# Patient Record
Sex: Male | Born: 1945
Health system: Southern US, Community
[De-identification: ages and names within clinical notes are randomized; demographics above are authoritative.]

## PROBLEM LIST (undated history)

## (undated) DIAGNOSIS — E119 Type 2 diabetes mellitus without complications: Secondary | ICD-10-CM

## (undated) DIAGNOSIS — I5022 Chronic systolic (congestive) heart failure: Secondary | ICD-10-CM

## (undated) DIAGNOSIS — I214 Non-ST elevation (NSTEMI) myocardial infarction: Secondary | ICD-10-CM

## (undated) DIAGNOSIS — I255 Ischemic cardiomyopathy: Secondary | ICD-10-CM

## (undated) DIAGNOSIS — E785 Hyperlipidemia, unspecified: Secondary | ICD-10-CM

## (undated) DIAGNOSIS — N1832 Chronic kidney disease, stage 3b: Secondary | ICD-10-CM

## (undated) DIAGNOSIS — I10 Essential (primary) hypertension: Secondary | ICD-10-CM

## (undated) HISTORY — PX: PROSTATE SURGERY: SHX751

## (undated) HISTORY — DX: Non-ST elevation (NSTEMI) myocardial infarction: I21.4

## (undated) HISTORY — PX: HERNIA REPAIR: SHX51

---

## 2015-08-28 DIAGNOSIS — G4733 Obstructive sleep apnea (adult) (pediatric): Secondary | ICD-10-CM | POA: Diagnosis not present

## 2015-09-25 DIAGNOSIS — R809 Proteinuria, unspecified: Secondary | ICD-10-CM | POA: Diagnosis not present

## 2015-09-25 DIAGNOSIS — E1129 Type 2 diabetes mellitus with other diabetic kidney complication: Secondary | ICD-10-CM | POA: Diagnosis not present

## 2015-09-25 DIAGNOSIS — I10 Essential (primary) hypertension: Secondary | ICD-10-CM | POA: Diagnosis not present

## 2015-09-25 DIAGNOSIS — N401 Enlarged prostate with lower urinary tract symptoms: Secondary | ICD-10-CM | POA: Diagnosis not present

## 2015-09-28 DIAGNOSIS — G4733 Obstructive sleep apnea (adult) (pediatric): Secondary | ICD-10-CM | POA: Diagnosis not present

## 2015-10-12 DIAGNOSIS — G4733 Obstructive sleep apnea (adult) (pediatric): Secondary | ICD-10-CM | POA: Diagnosis not present

## 2015-10-12 DIAGNOSIS — J301 Allergic rhinitis due to pollen: Secondary | ICD-10-CM | POA: Diagnosis not present

## 2015-10-26 DIAGNOSIS — G4733 Obstructive sleep apnea (adult) (pediatric): Secondary | ICD-10-CM | POA: Diagnosis not present

## 2015-11-04 DIAGNOSIS — G4733 Obstructive sleep apnea (adult) (pediatric): Secondary | ICD-10-CM | POA: Diagnosis not present

## 2015-11-06 DIAGNOSIS — E1129 Type 2 diabetes mellitus with other diabetic kidney complication: Secondary | ICD-10-CM | POA: Diagnosis not present

## 2015-11-06 DIAGNOSIS — Z1389 Encounter for screening for other disorder: Secondary | ICD-10-CM | POA: Diagnosis not present

## 2015-11-06 DIAGNOSIS — H6122 Impacted cerumen, left ear: Secondary | ICD-10-CM | POA: Diagnosis not present

## 2015-11-06 DIAGNOSIS — Z1211 Encounter for screening for malignant neoplasm of colon: Secondary | ICD-10-CM | POA: Diagnosis not present

## 2015-11-06 DIAGNOSIS — R809 Proteinuria, unspecified: Secondary | ICD-10-CM | POA: Diagnosis not present

## 2015-11-06 DIAGNOSIS — Z9181 History of falling: Secondary | ICD-10-CM | POA: Diagnosis not present

## 2015-11-06 DIAGNOSIS — I10 Essential (primary) hypertension: Secondary | ICD-10-CM | POA: Diagnosis not present

## 2015-11-06 DIAGNOSIS — N401 Enlarged prostate with lower urinary tract symptoms: Secondary | ICD-10-CM | POA: Diagnosis not present

## 2015-11-06 DIAGNOSIS — Z0000 Encounter for general adult medical examination without abnormal findings: Secondary | ICD-10-CM | POA: Diagnosis not present

## 2015-11-14 DIAGNOSIS — E119 Type 2 diabetes mellitus without complications: Secondary | ICD-10-CM | POA: Diagnosis not present

## 2015-11-14 DIAGNOSIS — H2513 Age-related nuclear cataract, bilateral: Secondary | ICD-10-CM | POA: Diagnosis not present

## 2015-11-26 DIAGNOSIS — G4733 Obstructive sleep apnea (adult) (pediatric): Secondary | ICD-10-CM | POA: Diagnosis not present

## 2015-11-27 DIAGNOSIS — R319 Hematuria, unspecified: Secondary | ICD-10-CM | POA: Diagnosis not present

## 2015-11-27 DIAGNOSIS — Z125 Encounter for screening for malignant neoplasm of prostate: Secondary | ICD-10-CM | POA: Diagnosis not present

## 2015-11-27 DIAGNOSIS — N200 Calculus of kidney: Secondary | ICD-10-CM | POA: Diagnosis not present

## 2015-11-27 DIAGNOSIS — R828 Abnormal findings on cytological and histological examination of urine: Secondary | ICD-10-CM | POA: Diagnosis not present

## 2015-11-29 DIAGNOSIS — Z1211 Encounter for screening for malignant neoplasm of colon: Secondary | ICD-10-CM | POA: Diagnosis not present

## 2015-12-22 DIAGNOSIS — R319 Hematuria, unspecified: Secondary | ICD-10-CM | POA: Diagnosis not present

## 2015-12-22 DIAGNOSIS — R896 Abnormal cytological findings in specimens from other organs, systems and tissues: Secondary | ICD-10-CM | POA: Diagnosis not present

## 2015-12-26 DIAGNOSIS — G4733 Obstructive sleep apnea (adult) (pediatric): Secondary | ICD-10-CM | POA: Diagnosis not present

## 2016-01-01 DIAGNOSIS — R896 Abnormal cytological findings in specimens from other organs, systems and tissues: Secondary | ICD-10-CM | POA: Diagnosis not present

## 2016-01-01 DIAGNOSIS — K4020 Bilateral inguinal hernia, without obstruction or gangrene, not specified as recurrent: Secondary | ICD-10-CM | POA: Diagnosis not present

## 2016-01-01 DIAGNOSIS — R319 Hematuria, unspecified: Secondary | ICD-10-CM | POA: Diagnosis not present

## 2016-01-01 DIAGNOSIS — K8020 Calculus of gallbladder without cholecystitis without obstruction: Secondary | ICD-10-CM | POA: Diagnosis not present

## 2016-01-01 DIAGNOSIS — N200 Calculus of kidney: Secondary | ICD-10-CM | POA: Diagnosis not present

## 2016-01-03 DIAGNOSIS — N210 Calculus in bladder: Secondary | ICD-10-CM | POA: Diagnosis not present

## 2016-01-15 DIAGNOSIS — Z125 Encounter for screening for malignant neoplasm of prostate: Secondary | ICD-10-CM | POA: Diagnosis not present

## 2016-01-15 DIAGNOSIS — R896 Abnormal cytological findings in specimens from other organs, systems and tissues: Secondary | ICD-10-CM | POA: Diagnosis not present

## 2016-01-15 DIAGNOSIS — N200 Calculus of kidney: Secondary | ICD-10-CM | POA: Diagnosis not present

## 2016-01-15 DIAGNOSIS — R319 Hematuria, unspecified: Secondary | ICD-10-CM | POA: Diagnosis not present

## 2016-01-23 DIAGNOSIS — M79671 Pain in right foot: Secondary | ICD-10-CM | POA: Diagnosis not present

## 2016-01-23 DIAGNOSIS — L03031 Cellulitis of right toe: Secondary | ICD-10-CM | POA: Diagnosis not present

## 2016-01-23 DIAGNOSIS — L989 Disorder of the skin and subcutaneous tissue, unspecified: Secondary | ICD-10-CM | POA: Diagnosis not present

## 2016-01-26 DIAGNOSIS — G4733 Obstructive sleep apnea (adult) (pediatric): Secondary | ICD-10-CM | POA: Diagnosis not present

## 2016-02-06 DIAGNOSIS — G4733 Obstructive sleep apnea (adult) (pediatric): Secondary | ICD-10-CM | POA: Diagnosis not present

## 2016-02-14 DIAGNOSIS — G4733 Obstructive sleep apnea (adult) (pediatric): Secondary | ICD-10-CM | POA: Diagnosis not present

## 2016-02-14 DIAGNOSIS — J301 Allergic rhinitis due to pollen: Secondary | ICD-10-CM | POA: Diagnosis not present

## 2016-02-15 DIAGNOSIS — G4733 Obstructive sleep apnea (adult) (pediatric): Secondary | ICD-10-CM | POA: Diagnosis not present

## 2016-02-19 DIAGNOSIS — M9902 Segmental and somatic dysfunction of thoracic region: Secondary | ICD-10-CM | POA: Diagnosis not present

## 2016-02-19 DIAGNOSIS — M9901 Segmental and somatic dysfunction of cervical region: Secondary | ICD-10-CM | POA: Diagnosis not present

## 2016-02-19 DIAGNOSIS — M791 Myalgia: Secondary | ICD-10-CM | POA: Diagnosis not present

## 2016-02-25 DIAGNOSIS — G4733 Obstructive sleep apnea (adult) (pediatric): Secondary | ICD-10-CM | POA: Diagnosis not present

## 2016-04-26 DIAGNOSIS — Z125 Encounter for screening for malignant neoplasm of prostate: Secondary | ICD-10-CM | POA: Diagnosis not present

## 2016-04-30 DIAGNOSIS — N400 Enlarged prostate without lower urinary tract symptoms: Secondary | ICD-10-CM | POA: Diagnosis not present

## 2016-04-30 DIAGNOSIS — N200 Calculus of kidney: Secondary | ICD-10-CM | POA: Diagnosis not present

## 2016-05-02 DIAGNOSIS — Z23 Encounter for immunization: Secondary | ICD-10-CM | POA: Diagnosis not present

## 2016-05-02 DIAGNOSIS — Z0000 Encounter for general adult medical examination without abnormal findings: Secondary | ICD-10-CM | POA: Diagnosis not present

## 2016-05-02 DIAGNOSIS — E1129 Type 2 diabetes mellitus with other diabetic kidney complication: Secondary | ICD-10-CM | POA: Diagnosis not present

## 2016-05-10 DIAGNOSIS — G4733 Obstructive sleep apnea (adult) (pediatric): Secondary | ICD-10-CM | POA: Diagnosis not present

## 2016-07-08 DIAGNOSIS — G4733 Obstructive sleep apnea (adult) (pediatric): Secondary | ICD-10-CM | POA: Diagnosis not present

## 2016-07-09 DIAGNOSIS — G4733 Obstructive sleep apnea (adult) (pediatric): Secondary | ICD-10-CM | POA: Diagnosis not present

## 2016-07-09 DIAGNOSIS — J301 Allergic rhinitis due to pollen: Secondary | ICD-10-CM | POA: Diagnosis not present

## 2016-08-01 DIAGNOSIS — N200 Calculus of kidney: Secondary | ICD-10-CM | POA: Diagnosis not present

## 2016-08-08 DIAGNOSIS — G4733 Obstructive sleep apnea (adult) (pediatric): Secondary | ICD-10-CM | POA: Diagnosis not present

## 2016-10-18 DIAGNOSIS — J301 Allergic rhinitis due to pollen: Secondary | ICD-10-CM | POA: Diagnosis not present

## 2016-10-18 DIAGNOSIS — G4733 Obstructive sleep apnea (adult) (pediatric): Secondary | ICD-10-CM | POA: Diagnosis not present

## 2016-11-06 DIAGNOSIS — K649 Unspecified hemorrhoids: Secondary | ICD-10-CM | POA: Diagnosis not present

## 2016-11-06 DIAGNOSIS — Z6839 Body mass index (BMI) 39.0-39.9, adult: Secondary | ICD-10-CM | POA: Diagnosis not present

## 2016-11-06 DIAGNOSIS — Z0000 Encounter for general adult medical examination without abnormal findings: Secondary | ICD-10-CM | POA: Diagnosis not present

## 2016-11-06 DIAGNOSIS — I10 Essential (primary) hypertension: Secondary | ICD-10-CM | POA: Diagnosis not present

## 2016-11-06 DIAGNOSIS — Z7984 Long term (current) use of oral hypoglycemic drugs: Secondary | ICD-10-CM | POA: Diagnosis not present

## 2016-11-06 DIAGNOSIS — Z86018 Personal history of other benign neoplasm: Secondary | ICD-10-CM | POA: Diagnosis not present

## 2016-11-06 DIAGNOSIS — E119 Type 2 diabetes mellitus without complications: Secondary | ICD-10-CM | POA: Diagnosis not present

## 2016-11-06 DIAGNOSIS — K089 Disorder of teeth and supporting structures, unspecified: Secondary | ICD-10-CM | POA: Diagnosis not present

## 2016-11-06 DIAGNOSIS — E669 Obesity, unspecified: Secondary | ICD-10-CM | POA: Diagnosis not present

## 2016-11-06 DIAGNOSIS — N403 Nodular prostate with lower urinary tract symptoms: Secondary | ICD-10-CM | POA: Diagnosis not present

## 2016-11-18 DIAGNOSIS — G4733 Obstructive sleep apnea (adult) (pediatric): Secondary | ICD-10-CM | POA: Diagnosis not present

## 2016-11-19 DIAGNOSIS — N400 Enlarged prostate without lower urinary tract symptoms: Secondary | ICD-10-CM | POA: Diagnosis not present

## 2016-11-26 DIAGNOSIS — N2889 Other specified disorders of kidney and ureter: Secondary | ICD-10-CM | POA: Diagnosis not present

## 2016-11-26 DIAGNOSIS — N200 Calculus of kidney: Secondary | ICD-10-CM | POA: Diagnosis not present

## 2016-11-26 DIAGNOSIS — N400 Enlarged prostate without lower urinary tract symptoms: Secondary | ICD-10-CM | POA: Diagnosis not present

## 2016-12-09 DIAGNOSIS — E119 Type 2 diabetes mellitus without complications: Secondary | ICD-10-CM | POA: Diagnosis not present

## 2016-12-09 DIAGNOSIS — H2513 Age-related nuclear cataract, bilateral: Secondary | ICD-10-CM | POA: Diagnosis not present

## 2017-01-07 DIAGNOSIS — Z6837 Body mass index (BMI) 37.0-37.9, adult: Secondary | ICD-10-CM | POA: Diagnosis not present

## 2017-01-07 DIAGNOSIS — R809 Proteinuria, unspecified: Secondary | ICD-10-CM | POA: Diagnosis not present

## 2017-01-07 DIAGNOSIS — S40811A Abrasion of right upper arm, initial encounter: Secondary | ICD-10-CM | POA: Diagnosis not present

## 2017-01-07 DIAGNOSIS — I10 Essential (primary) hypertension: Secondary | ICD-10-CM | POA: Diagnosis not present

## 2017-01-07 DIAGNOSIS — N401 Enlarged prostate with lower urinary tract symptoms: Secondary | ICD-10-CM | POA: Diagnosis not present

## 2017-01-07 DIAGNOSIS — E1129 Type 2 diabetes mellitus with other diabetic kidney complication: Secondary | ICD-10-CM | POA: Diagnosis not present

## 2017-01-07 DIAGNOSIS — Z9181 History of falling: Secondary | ICD-10-CM | POA: Diagnosis not present

## 2017-01-07 DIAGNOSIS — Z1389 Encounter for screening for other disorder: Secondary | ICD-10-CM | POA: Diagnosis not present

## 2017-01-07 DIAGNOSIS — Z0000 Encounter for general adult medical examination without abnormal findings: Secondary | ICD-10-CM | POA: Diagnosis not present

## 2017-01-07 DIAGNOSIS — Z1159 Encounter for screening for other viral diseases: Secondary | ICD-10-CM | POA: Diagnosis not present

## 2017-01-16 DIAGNOSIS — M79672 Pain in left foot: Secondary | ICD-10-CM | POA: Diagnosis not present

## 2017-01-16 DIAGNOSIS — L989 Disorder of the skin and subcutaneous tissue, unspecified: Secondary | ICD-10-CM | POA: Diagnosis not present

## 2017-01-16 DIAGNOSIS — L600 Ingrowing nail: Secondary | ICD-10-CM | POA: Diagnosis not present

## 2017-01-30 DIAGNOSIS — L603 Nail dystrophy: Secondary | ICD-10-CM | POA: Diagnosis not present

## 2017-01-30 DIAGNOSIS — Z6838 Body mass index (BMI) 38.0-38.9, adult: Secondary | ICD-10-CM | POA: Diagnosis not present

## 2017-01-30 DIAGNOSIS — R809 Proteinuria, unspecified: Secondary | ICD-10-CM | POA: Diagnosis not present

## 2017-01-30 DIAGNOSIS — N401 Enlarged prostate with lower urinary tract symptoms: Secondary | ICD-10-CM | POA: Diagnosis not present

## 2017-01-30 DIAGNOSIS — E1129 Type 2 diabetes mellitus with other diabetic kidney complication: Secondary | ICD-10-CM | POA: Diagnosis not present

## 2017-01-30 DIAGNOSIS — I10 Essential (primary) hypertension: Secondary | ICD-10-CM | POA: Diagnosis not present

## 2017-02-20 DIAGNOSIS — G4733 Obstructive sleep apnea (adult) (pediatric): Secondary | ICD-10-CM | POA: Diagnosis not present

## 2017-03-04 DIAGNOSIS — I10 Essential (primary) hypertension: Secondary | ICD-10-CM | POA: Diagnosis not present

## 2017-03-04 DIAGNOSIS — Z6837 Body mass index (BMI) 37.0-37.9, adult: Secondary | ICD-10-CM | POA: Diagnosis not present

## 2017-03-04 DIAGNOSIS — Z0000 Encounter for general adult medical examination without abnormal findings: Secondary | ICD-10-CM | POA: Diagnosis not present

## 2017-04-04 DIAGNOSIS — Z6837 Body mass index (BMI) 37.0-37.9, adult: Secondary | ICD-10-CM | POA: Diagnosis not present

## 2017-04-04 DIAGNOSIS — R809 Proteinuria, unspecified: Secondary | ICD-10-CM | POA: Diagnosis not present

## 2017-04-04 DIAGNOSIS — E1129 Type 2 diabetes mellitus with other diabetic kidney complication: Secondary | ICD-10-CM | POA: Diagnosis not present

## 2017-04-04 DIAGNOSIS — I10 Essential (primary) hypertension: Secondary | ICD-10-CM | POA: Diagnosis not present

## 2017-04-04 DIAGNOSIS — N401 Enlarged prostate with lower urinary tract symptoms: Secondary | ICD-10-CM | POA: Diagnosis not present

## 2017-05-12 DIAGNOSIS — L03031 Cellulitis of right toe: Secondary | ICD-10-CM | POA: Diagnosis not present

## 2017-05-12 DIAGNOSIS — L989 Disorder of the skin and subcutaneous tissue, unspecified: Secondary | ICD-10-CM | POA: Diagnosis not present

## 2017-05-12 DIAGNOSIS — M79671 Pain in right foot: Secondary | ICD-10-CM | POA: Diagnosis not present

## 2017-05-27 DIAGNOSIS — G4733 Obstructive sleep apnea (adult) (pediatric): Secondary | ICD-10-CM | POA: Diagnosis not present

## 2017-07-10 DIAGNOSIS — E1129 Type 2 diabetes mellitus with other diabetic kidney complication: Secondary | ICD-10-CM | POA: Diagnosis not present

## 2017-07-10 DIAGNOSIS — Z79899 Other long term (current) drug therapy: Secondary | ICD-10-CM | POA: Diagnosis not present

## 2017-07-10 DIAGNOSIS — I10 Essential (primary) hypertension: Secondary | ICD-10-CM | POA: Diagnosis not present

## 2017-07-10 DIAGNOSIS — N401 Enlarged prostate with lower urinary tract symptoms: Secondary | ICD-10-CM | POA: Diagnosis not present

## 2017-07-10 DIAGNOSIS — R809 Proteinuria, unspecified: Secondary | ICD-10-CM | POA: Diagnosis not present

## 2017-07-10 DIAGNOSIS — L603 Nail dystrophy: Secondary | ICD-10-CM | POA: Diagnosis not present

## 2017-07-10 DIAGNOSIS — Z6836 Body mass index (BMI) 36.0-36.9, adult: Secondary | ICD-10-CM | POA: Diagnosis not present

## 2017-07-10 DIAGNOSIS — Z1339 Encounter for screening examination for other mental health and behavioral disorders: Secondary | ICD-10-CM | POA: Diagnosis not present

## 2017-07-10 DIAGNOSIS — Z125 Encounter for screening for malignant neoplasm of prostate: Secondary | ICD-10-CM | POA: Diagnosis not present

## 2017-07-11 DIAGNOSIS — E119 Type 2 diabetes mellitus without complications: Secondary | ICD-10-CM | POA: Diagnosis not present

## 2017-07-11 DIAGNOSIS — H2513 Age-related nuclear cataract, bilateral: Secondary | ICD-10-CM | POA: Diagnosis not present

## 2017-08-28 DIAGNOSIS — G4733 Obstructive sleep apnea (adult) (pediatric): Secondary | ICD-10-CM | POA: Diagnosis not present

## 2017-12-05 DIAGNOSIS — G4733 Obstructive sleep apnea (adult) (pediatric): Secondary | ICD-10-CM | POA: Diagnosis not present

## 2017-12-09 DIAGNOSIS — N200 Calculus of kidney: Secondary | ICD-10-CM | POA: Diagnosis not present

## 2017-12-09 DIAGNOSIS — N400 Enlarged prostate without lower urinary tract symptoms: Secondary | ICD-10-CM | POA: Diagnosis not present

## 2017-12-09 DIAGNOSIS — R310 Gross hematuria: Secondary | ICD-10-CM | POA: Diagnosis not present

## 2017-12-09 DIAGNOSIS — R82998 Other abnormal findings in urine: Secondary | ICD-10-CM | POA: Diagnosis not present

## 2017-12-16 DIAGNOSIS — R896 Abnormal cytological findings in specimens from other organs, systems and tissues: Secondary | ICD-10-CM | POA: Diagnosis not present

## 2018-01-07 DIAGNOSIS — E119 Type 2 diabetes mellitus without complications: Secondary | ICD-10-CM | POA: Diagnosis not present

## 2018-01-07 DIAGNOSIS — H2513 Age-related nuclear cataract, bilateral: Secondary | ICD-10-CM | POA: Diagnosis not present

## 2018-01-12 DIAGNOSIS — Z0000 Encounter for general adult medical examination without abnormal findings: Secondary | ICD-10-CM | POA: Diagnosis not present

## 2018-01-12 DIAGNOSIS — R809 Proteinuria, unspecified: Secondary | ICD-10-CM | POA: Diagnosis not present

## 2018-01-12 DIAGNOSIS — N401 Enlarged prostate with lower urinary tract symptoms: Secondary | ICD-10-CM | POA: Diagnosis not present

## 2018-01-12 DIAGNOSIS — E1129 Type 2 diabetes mellitus with other diabetic kidney complication: Secondary | ICD-10-CM | POA: Diagnosis not present

## 2018-01-12 DIAGNOSIS — I10 Essential (primary) hypertension: Secondary | ICD-10-CM | POA: Diagnosis not present

## 2018-01-12 DIAGNOSIS — Z9181 History of falling: Secondary | ICD-10-CM | POA: Diagnosis not present

## 2018-01-12 DIAGNOSIS — Z6835 Body mass index (BMI) 35.0-35.9, adult: Secondary | ICD-10-CM | POA: Diagnosis not present

## 2018-01-12 DIAGNOSIS — Z1331 Encounter for screening for depression: Secondary | ICD-10-CM | POA: Diagnosis not present

## 2018-01-17 DIAGNOSIS — B351 Tinea unguium: Secondary | ICD-10-CM | POA: Diagnosis not present

## 2018-01-17 DIAGNOSIS — M79674 Pain in right toe(s): Secondary | ICD-10-CM | POA: Diagnosis not present

## 2018-01-17 DIAGNOSIS — M79609 Pain in unspecified limb: Secondary | ICD-10-CM | POA: Diagnosis not present

## 2018-01-17 DIAGNOSIS — L602 Onychogryphosis: Secondary | ICD-10-CM | POA: Diagnosis not present

## 2018-01-17 DIAGNOSIS — M79675 Pain in left toe(s): Secondary | ICD-10-CM | POA: Diagnosis not present

## 2018-03-10 DIAGNOSIS — G4733 Obstructive sleep apnea (adult) (pediatric): Secondary | ICD-10-CM | POA: Diagnosis not present

## 2018-06-10 DIAGNOSIS — R310 Gross hematuria: Secondary | ICD-10-CM | POA: Diagnosis not present

## 2018-06-10 DIAGNOSIS — N200 Calculus of kidney: Secondary | ICD-10-CM | POA: Diagnosis not present

## 2018-06-10 DIAGNOSIS — R319 Hematuria, unspecified: Secondary | ICD-10-CM | POA: Diagnosis not present

## 2018-06-10 DIAGNOSIS — R82998 Other abnormal findings in urine: Secondary | ICD-10-CM | POA: Diagnosis not present

## 2018-06-11 DIAGNOSIS — G4733 Obstructive sleep apnea (adult) (pediatric): Secondary | ICD-10-CM | POA: Diagnosis not present

## 2018-07-02 DIAGNOSIS — M79609 Pain in unspecified limb: Secondary | ICD-10-CM | POA: Diagnosis not present

## 2018-07-02 DIAGNOSIS — M79674 Pain in right toe(s): Secondary | ICD-10-CM | POA: Diagnosis not present

## 2018-07-02 DIAGNOSIS — B351 Tinea unguium: Secondary | ICD-10-CM | POA: Diagnosis not present

## 2018-07-02 DIAGNOSIS — M79675 Pain in left toe(s): Secondary | ICD-10-CM | POA: Diagnosis not present

## 2018-07-02 DIAGNOSIS — L84 Corns and callosities: Secondary | ICD-10-CM | POA: Diagnosis not present

## 2018-07-02 DIAGNOSIS — E1151 Type 2 diabetes mellitus with diabetic peripheral angiopathy without gangrene: Secondary | ICD-10-CM | POA: Diagnosis not present

## 2018-07-02 DIAGNOSIS — L602 Onychogryphosis: Secondary | ICD-10-CM | POA: Diagnosis not present

## 2018-07-31 DIAGNOSIS — N401 Enlarged prostate with lower urinary tract symptoms: Secondary | ICD-10-CM | POA: Diagnosis not present

## 2018-07-31 DIAGNOSIS — Z2821 Immunization not carried out because of patient refusal: Secondary | ICD-10-CM | POA: Diagnosis not present

## 2018-07-31 DIAGNOSIS — E1129 Type 2 diabetes mellitus with other diabetic kidney complication: Secondary | ICD-10-CM | POA: Diagnosis not present

## 2018-07-31 DIAGNOSIS — Z6836 Body mass index (BMI) 36.0-36.9, adult: Secondary | ICD-10-CM | POA: Diagnosis not present

## 2018-07-31 DIAGNOSIS — R809 Proteinuria, unspecified: Secondary | ICD-10-CM | POA: Diagnosis not present

## 2018-07-31 DIAGNOSIS — Z23 Encounter for immunization: Secondary | ICD-10-CM | POA: Diagnosis not present

## 2018-07-31 DIAGNOSIS — I10 Essential (primary) hypertension: Secondary | ICD-10-CM | POA: Diagnosis not present

## 2018-09-17 DIAGNOSIS — G4733 Obstructive sleep apnea (adult) (pediatric): Secondary | ICD-10-CM | POA: Diagnosis not present

## 2018-12-21 DIAGNOSIS — G4733 Obstructive sleep apnea (adult) (pediatric): Secondary | ICD-10-CM | POA: Diagnosis not present

## 2019-01-13 DIAGNOSIS — N200 Calculus of kidney: Secondary | ICD-10-CM | POA: Diagnosis not present

## 2019-01-13 DIAGNOSIS — R319 Hematuria, unspecified: Secondary | ICD-10-CM | POA: Diagnosis not present

## 2019-01-27 DIAGNOSIS — K8020 Calculus of gallbladder without cholecystitis without obstruction: Secondary | ICD-10-CM | POA: Diagnosis not present

## 2019-01-27 DIAGNOSIS — R319 Hematuria, unspecified: Secondary | ICD-10-CM | POA: Diagnosis not present

## 2019-01-27 DIAGNOSIS — K4020 Bilateral inguinal hernia, without obstruction or gangrene, not specified as recurrent: Secondary | ICD-10-CM | POA: Diagnosis not present

## 2019-01-27 DIAGNOSIS — N210 Calculus in bladder: Secondary | ICD-10-CM | POA: Diagnosis not present

## 2019-01-27 DIAGNOSIS — N400 Enlarged prostate without lower urinary tract symptoms: Secondary | ICD-10-CM | POA: Diagnosis not present

## 2019-01-27 DIAGNOSIS — K429 Umbilical hernia without obstruction or gangrene: Secondary | ICD-10-CM | POA: Diagnosis not present

## 2019-01-27 DIAGNOSIS — N200 Calculus of kidney: Secondary | ICD-10-CM | POA: Diagnosis not present

## 2019-02-01 DIAGNOSIS — N400 Enlarged prostate without lower urinary tract symptoms: Secondary | ICD-10-CM | POA: Diagnosis not present

## 2019-02-01 DIAGNOSIS — N210 Calculus in bladder: Secondary | ICD-10-CM | POA: Diagnosis not present

## 2019-02-01 DIAGNOSIS — N200 Calculus of kidney: Secondary | ICD-10-CM | POA: Diagnosis not present

## 2019-02-08 DIAGNOSIS — E785 Hyperlipidemia, unspecified: Secondary | ICD-10-CM | POA: Diagnosis not present

## 2019-02-08 DIAGNOSIS — Z79899 Other long term (current) drug therapy: Secondary | ICD-10-CM | POA: Diagnosis not present

## 2019-02-08 DIAGNOSIS — Z0000 Encounter for general adult medical examination without abnormal findings: Secondary | ICD-10-CM | POA: Diagnosis not present

## 2019-02-08 DIAGNOSIS — Z9181 History of falling: Secondary | ICD-10-CM | POA: Diagnosis not present

## 2019-02-08 DIAGNOSIS — I10 Essential (primary) hypertension: Secondary | ICD-10-CM | POA: Diagnosis not present

## 2019-02-08 DIAGNOSIS — R809 Proteinuria, unspecified: Secondary | ICD-10-CM | POA: Diagnosis not present

## 2019-02-08 DIAGNOSIS — Z6837 Body mass index (BMI) 37.0-37.9, adult: Secondary | ICD-10-CM | POA: Diagnosis not present

## 2019-02-08 DIAGNOSIS — N401 Enlarged prostate with lower urinary tract symptoms: Secondary | ICD-10-CM | POA: Diagnosis not present

## 2019-02-08 DIAGNOSIS — E1129 Type 2 diabetes mellitus with other diabetic kidney complication: Secondary | ICD-10-CM | POA: Diagnosis not present

## 2019-02-10 DIAGNOSIS — H2513 Age-related nuclear cataract, bilateral: Secondary | ICD-10-CM | POA: Diagnosis not present

## 2019-02-10 DIAGNOSIS — E119 Type 2 diabetes mellitus without complications: Secondary | ICD-10-CM | POA: Diagnosis not present

## 2019-02-15 DIAGNOSIS — N400 Enlarged prostate without lower urinary tract symptoms: Secondary | ICD-10-CM | POA: Diagnosis not present

## 2019-02-15 DIAGNOSIS — N210 Calculus in bladder: Secondary | ICD-10-CM | POA: Diagnosis not present

## 2019-02-15 DIAGNOSIS — N200 Calculus of kidney: Secondary | ICD-10-CM | POA: Diagnosis not present

## 2019-02-17 DIAGNOSIS — K4020 Bilateral inguinal hernia, without obstruction or gangrene, not specified as recurrent: Secondary | ICD-10-CM | POA: Diagnosis not present

## 2019-02-17 DIAGNOSIS — K429 Umbilical hernia without obstruction or gangrene: Secondary | ICD-10-CM | POA: Diagnosis not present

## 2019-03-10 DIAGNOSIS — R9431 Abnormal electrocardiogram [ECG] [EKG]: Secondary | ICD-10-CM | POA: Diagnosis not present

## 2019-03-10 DIAGNOSIS — Z01818 Encounter for other preprocedural examination: Secondary | ICD-10-CM | POA: Diagnosis not present

## 2019-03-10 DIAGNOSIS — M4010 Other secondary kyphosis, site unspecified: Secondary | ICD-10-CM | POA: Diagnosis not present

## 2019-03-17 DIAGNOSIS — I129 Hypertensive chronic kidney disease with stage 1 through stage 4 chronic kidney disease, or unspecified chronic kidney disease: Secondary | ICD-10-CM | POA: Diagnosis not present

## 2019-03-17 DIAGNOSIS — M1990 Unspecified osteoarthritis, unspecified site: Secondary | ICD-10-CM | POA: Diagnosis not present

## 2019-03-17 DIAGNOSIS — K4000 Bilateral inguinal hernia, with obstruction, without gangrene, not specified as recurrent: Secondary | ICD-10-CM | POA: Diagnosis not present

## 2019-03-17 DIAGNOSIS — I10 Essential (primary) hypertension: Secondary | ICD-10-CM | POA: Diagnosis not present

## 2019-03-17 DIAGNOSIS — N200 Calculus of kidney: Secondary | ICD-10-CM | POA: Diagnosis not present

## 2019-03-17 DIAGNOSIS — N189 Chronic kidney disease, unspecified: Secondary | ICD-10-CM | POA: Diagnosis not present

## 2019-03-17 DIAGNOSIS — R338 Other retention of urine: Secondary | ICD-10-CM | POA: Diagnosis not present

## 2019-03-17 DIAGNOSIS — E119 Type 2 diabetes mellitus without complications: Secondary | ICD-10-CM | POA: Diagnosis not present

## 2019-03-17 DIAGNOSIS — E1122 Type 2 diabetes mellitus with diabetic chronic kidney disease: Secondary | ICD-10-CM | POA: Diagnosis not present

## 2019-03-17 DIAGNOSIS — K420 Umbilical hernia with obstruction, without gangrene: Secondary | ICD-10-CM | POA: Diagnosis not present

## 2019-03-17 DIAGNOSIS — E669 Obesity, unspecified: Secondary | ICD-10-CM | POA: Diagnosis not present

## 2019-03-17 DIAGNOSIS — N210 Calculus in bladder: Secondary | ICD-10-CM | POA: Diagnosis not present

## 2019-03-17 DIAGNOSIS — N138 Other obstructive and reflux uropathy: Secondary | ICD-10-CM | POA: Diagnosis not present

## 2019-03-17 DIAGNOSIS — N401 Enlarged prostate with lower urinary tract symptoms: Secondary | ICD-10-CM | POA: Diagnosis not present

## 2019-03-31 DIAGNOSIS — R339 Retention of urine, unspecified: Secondary | ICD-10-CM | POA: Diagnosis not present

## 2019-05-03 DIAGNOSIS — N138 Other obstructive and reflux uropathy: Secondary | ICD-10-CM | POA: Diagnosis not present

## 2019-05-03 DIAGNOSIS — N200 Calculus of kidney: Secondary | ICD-10-CM | POA: Diagnosis not present

## 2019-05-03 DIAGNOSIS — N401 Enlarged prostate with lower urinary tract symptoms: Secondary | ICD-10-CM | POA: Diagnosis not present

## 2019-07-05 DIAGNOSIS — I10 Essential (primary) hypertension: Secondary | ICD-10-CM | POA: Diagnosis not present

## 2019-07-05 DIAGNOSIS — N200 Calculus of kidney: Secondary | ICD-10-CM | POA: Diagnosis not present

## 2019-07-05 DIAGNOSIS — R7989 Other specified abnormal findings of blood chemistry: Secondary | ICD-10-CM | POA: Diagnosis not present

## 2019-07-14 DIAGNOSIS — J309 Allergic rhinitis, unspecified: Secondary | ICD-10-CM | POA: Diagnosis not present

## 2019-07-14 DIAGNOSIS — R1084 Generalized abdominal pain: Secondary | ICD-10-CM | POA: Diagnosis not present

## 2019-07-14 DIAGNOSIS — R809 Proteinuria, unspecified: Secondary | ICD-10-CM | POA: Diagnosis not present

## 2019-07-14 DIAGNOSIS — Z79899 Other long term (current) drug therapy: Secondary | ICD-10-CM | POA: Diagnosis not present

## 2019-07-14 DIAGNOSIS — N401 Enlarged prostate with lower urinary tract symptoms: Secondary | ICD-10-CM | POA: Diagnosis not present

## 2019-07-14 DIAGNOSIS — Z6836 Body mass index (BMI) 36.0-36.9, adult: Secondary | ICD-10-CM | POA: Diagnosis not present

## 2019-07-14 DIAGNOSIS — I10 Essential (primary) hypertension: Secondary | ICD-10-CM | POA: Diagnosis not present

## 2019-07-14 DIAGNOSIS — E785 Hyperlipidemia, unspecified: Secondary | ICD-10-CM | POA: Diagnosis not present

## 2019-07-14 DIAGNOSIS — E1129 Type 2 diabetes mellitus with other diabetic kidney complication: Secondary | ICD-10-CM | POA: Diagnosis not present

## 2019-07-16 DIAGNOSIS — N200 Calculus of kidney: Secondary | ICD-10-CM | POA: Diagnosis not present

## 2019-12-15 DIAGNOSIS — N200 Calculus of kidney: Secondary | ICD-10-CM | POA: Diagnosis not present

## 2019-12-15 DIAGNOSIS — N401 Enlarged prostate with lower urinary tract symptoms: Secondary | ICD-10-CM | POA: Diagnosis not present

## 2019-12-15 DIAGNOSIS — N138 Other obstructive and reflux uropathy: Secondary | ICD-10-CM | POA: Diagnosis not present

## 2019-12-29 DIAGNOSIS — N200 Calculus of kidney: Secondary | ICD-10-CM | POA: Diagnosis not present

## 2020-01-03 DIAGNOSIS — L255 Unspecified contact dermatitis due to plants, except food: Secondary | ICD-10-CM | POA: Diagnosis not present

## 2020-01-18 DIAGNOSIS — Z79899 Other long term (current) drug therapy: Secondary | ICD-10-CM | POA: Diagnosis not present

## 2020-01-18 DIAGNOSIS — I10 Essential (primary) hypertension: Secondary | ICD-10-CM | POA: Diagnosis not present

## 2020-01-18 DIAGNOSIS — E1129 Type 2 diabetes mellitus with other diabetic kidney complication: Secondary | ICD-10-CM | POA: Diagnosis not present

## 2020-01-18 DIAGNOSIS — L603 Nail dystrophy: Secondary | ICD-10-CM | POA: Diagnosis not present

## 2020-01-18 DIAGNOSIS — Z6836 Body mass index (BMI) 36.0-36.9, adult: Secondary | ICD-10-CM | POA: Diagnosis not present

## 2020-01-18 DIAGNOSIS — H9319 Tinnitus, unspecified ear: Secondary | ICD-10-CM | POA: Diagnosis not present

## 2020-01-18 DIAGNOSIS — N401 Enlarged prostate with lower urinary tract symptoms: Secondary | ICD-10-CM | POA: Diagnosis not present

## 2020-01-18 DIAGNOSIS — E785 Hyperlipidemia, unspecified: Secondary | ICD-10-CM | POA: Diagnosis not present

## 2020-01-18 DIAGNOSIS — R809 Proteinuria, unspecified: Secondary | ICD-10-CM | POA: Diagnosis not present

## 2020-01-26 DIAGNOSIS — Z7984 Long term (current) use of oral hypoglycemic drugs: Secondary | ICD-10-CM | POA: Diagnosis not present

## 2020-01-26 DIAGNOSIS — L603 Nail dystrophy: Secondary | ICD-10-CM | POA: Diagnosis not present

## 2020-01-26 DIAGNOSIS — E119 Type 2 diabetes mellitus without complications: Secondary | ICD-10-CM | POA: Diagnosis not present

## 2020-02-07 DIAGNOSIS — M62830 Muscle spasm of back: Secondary | ICD-10-CM | POA: Diagnosis not present

## 2020-02-07 DIAGNOSIS — M9903 Segmental and somatic dysfunction of lumbar region: Secondary | ICD-10-CM | POA: Diagnosis not present

## 2020-02-07 DIAGNOSIS — M5136 Other intervertebral disc degeneration, lumbar region: Secondary | ICD-10-CM | POA: Diagnosis not present

## 2020-02-07 DIAGNOSIS — M9904 Segmental and somatic dysfunction of sacral region: Secondary | ICD-10-CM | POA: Diagnosis not present

## 2020-02-07 DIAGNOSIS — M9902 Segmental and somatic dysfunction of thoracic region: Secondary | ICD-10-CM | POA: Diagnosis not present

## 2020-02-07 DIAGNOSIS — M5442 Lumbago with sciatica, left side: Secondary | ICD-10-CM | POA: Diagnosis not present

## 2020-02-07 DIAGNOSIS — M5137 Other intervertebral disc degeneration, lumbosacral region: Secondary | ICD-10-CM | POA: Diagnosis not present

## 2020-02-09 DIAGNOSIS — M5442 Lumbago with sciatica, left side: Secondary | ICD-10-CM | POA: Diagnosis not present

## 2020-02-09 DIAGNOSIS — M5137 Other intervertebral disc degeneration, lumbosacral region: Secondary | ICD-10-CM | POA: Diagnosis not present

## 2020-02-09 DIAGNOSIS — M9903 Segmental and somatic dysfunction of lumbar region: Secondary | ICD-10-CM | POA: Diagnosis not present

## 2020-02-09 DIAGNOSIS — M62830 Muscle spasm of back: Secondary | ICD-10-CM | POA: Diagnosis not present

## 2020-02-09 DIAGNOSIS — M5136 Other intervertebral disc degeneration, lumbar region: Secondary | ICD-10-CM | POA: Diagnosis not present

## 2020-02-09 DIAGNOSIS — M9904 Segmental and somatic dysfunction of sacral region: Secondary | ICD-10-CM | POA: Diagnosis not present

## 2020-02-09 DIAGNOSIS — M9902 Segmental and somatic dysfunction of thoracic region: Secondary | ICD-10-CM | POA: Diagnosis not present

## 2020-02-14 DIAGNOSIS — M5137 Other intervertebral disc degeneration, lumbosacral region: Secondary | ICD-10-CM | POA: Diagnosis not present

## 2020-02-14 DIAGNOSIS — M9902 Segmental and somatic dysfunction of thoracic region: Secondary | ICD-10-CM | POA: Diagnosis not present

## 2020-02-14 DIAGNOSIS — M5136 Other intervertebral disc degeneration, lumbar region: Secondary | ICD-10-CM | POA: Diagnosis not present

## 2020-02-14 DIAGNOSIS — M9903 Segmental and somatic dysfunction of lumbar region: Secondary | ICD-10-CM | POA: Diagnosis not present

## 2020-02-14 DIAGNOSIS — M62830 Muscle spasm of back: Secondary | ICD-10-CM | POA: Diagnosis not present

## 2020-02-14 DIAGNOSIS — M9904 Segmental and somatic dysfunction of sacral region: Secondary | ICD-10-CM | POA: Diagnosis not present

## 2020-02-14 DIAGNOSIS — M5442 Lumbago with sciatica, left side: Secondary | ICD-10-CM | POA: Diagnosis not present

## 2020-02-17 DIAGNOSIS — M9904 Segmental and somatic dysfunction of sacral region: Secondary | ICD-10-CM | POA: Diagnosis not present

## 2020-02-17 DIAGNOSIS — M5136 Other intervertebral disc degeneration, lumbar region: Secondary | ICD-10-CM | POA: Diagnosis not present

## 2020-02-17 DIAGNOSIS — M9902 Segmental and somatic dysfunction of thoracic region: Secondary | ICD-10-CM | POA: Diagnosis not present

## 2020-02-17 DIAGNOSIS — M9903 Segmental and somatic dysfunction of lumbar region: Secondary | ICD-10-CM | POA: Diagnosis not present

## 2020-02-17 DIAGNOSIS — M62830 Muscle spasm of back: Secondary | ICD-10-CM | POA: Diagnosis not present

## 2020-02-17 DIAGNOSIS — M5137 Other intervertebral disc degeneration, lumbosacral region: Secondary | ICD-10-CM | POA: Diagnosis not present

## 2020-02-17 DIAGNOSIS — M5442 Lumbago with sciatica, left side: Secondary | ICD-10-CM | POA: Diagnosis not present

## 2020-02-21 DIAGNOSIS — M5442 Lumbago with sciatica, left side: Secondary | ICD-10-CM | POA: Diagnosis not present

## 2020-02-21 DIAGNOSIS — M9904 Segmental and somatic dysfunction of sacral region: Secondary | ICD-10-CM | POA: Diagnosis not present

## 2020-02-21 DIAGNOSIS — H2513 Age-related nuclear cataract, bilateral: Secondary | ICD-10-CM | POA: Diagnosis not present

## 2020-02-21 DIAGNOSIS — M5137 Other intervertebral disc degeneration, lumbosacral region: Secondary | ICD-10-CM | POA: Diagnosis not present

## 2020-02-21 DIAGNOSIS — M9902 Segmental and somatic dysfunction of thoracic region: Secondary | ICD-10-CM | POA: Diagnosis not present

## 2020-02-21 DIAGNOSIS — M9903 Segmental and somatic dysfunction of lumbar region: Secondary | ICD-10-CM | POA: Diagnosis not present

## 2020-02-21 DIAGNOSIS — M5136 Other intervertebral disc degeneration, lumbar region: Secondary | ICD-10-CM | POA: Diagnosis not present

## 2020-02-21 DIAGNOSIS — E119 Type 2 diabetes mellitus without complications: Secondary | ICD-10-CM | POA: Diagnosis not present

## 2020-02-21 DIAGNOSIS — M62830 Muscle spasm of back: Secondary | ICD-10-CM | POA: Diagnosis not present

## 2020-02-28 DIAGNOSIS — I10 Essential (primary) hypertension: Secondary | ICD-10-CM | POA: Diagnosis not present

## 2020-02-28 DIAGNOSIS — K4090 Unilateral inguinal hernia, without obstruction or gangrene, not specified as recurrent: Secondary | ICD-10-CM | POA: Diagnosis not present

## 2020-02-28 DIAGNOSIS — K429 Umbilical hernia without obstruction or gangrene: Secondary | ICD-10-CM | POA: Diagnosis not present

## 2020-02-28 DIAGNOSIS — N210 Calculus in bladder: Secondary | ICD-10-CM | POA: Diagnosis not present

## 2020-02-28 DIAGNOSIS — N200 Calculus of kidney: Secondary | ICD-10-CM | POA: Diagnosis not present

## 2020-02-28 DIAGNOSIS — E119 Type 2 diabetes mellitus without complications: Secondary | ICD-10-CM | POA: Diagnosis not present

## 2020-02-28 DIAGNOSIS — N401 Enlarged prostate with lower urinary tract symptoms: Secondary | ICD-10-CM | POA: Diagnosis not present

## 2020-02-28 DIAGNOSIS — M1990 Unspecified osteoarthritis, unspecified site: Secondary | ICD-10-CM | POA: Diagnosis not present

## 2020-02-28 DIAGNOSIS — Z9079 Acquired absence of other genital organ(s): Secondary | ICD-10-CM | POA: Diagnosis not present

## 2020-02-28 DIAGNOSIS — G4730 Sleep apnea, unspecified: Secondary | ICD-10-CM | POA: Diagnosis not present

## 2020-03-01 DIAGNOSIS — M9904 Segmental and somatic dysfunction of sacral region: Secondary | ICD-10-CM | POA: Diagnosis not present

## 2020-03-01 DIAGNOSIS — M62830 Muscle spasm of back: Secondary | ICD-10-CM | POA: Diagnosis not present

## 2020-03-01 DIAGNOSIS — M5442 Lumbago with sciatica, left side: Secondary | ICD-10-CM | POA: Diagnosis not present

## 2020-03-01 DIAGNOSIS — M9903 Segmental and somatic dysfunction of lumbar region: Secondary | ICD-10-CM | POA: Diagnosis not present

## 2020-03-01 DIAGNOSIS — M9902 Segmental and somatic dysfunction of thoracic region: Secondary | ICD-10-CM | POA: Diagnosis not present

## 2020-03-01 DIAGNOSIS — M5136 Other intervertebral disc degeneration, lumbar region: Secondary | ICD-10-CM | POA: Diagnosis not present

## 2020-03-01 DIAGNOSIS — M5137 Other intervertebral disc degeneration, lumbosacral region: Secondary | ICD-10-CM | POA: Diagnosis not present

## 2020-03-06 DIAGNOSIS — M9903 Segmental and somatic dysfunction of lumbar region: Secondary | ICD-10-CM | POA: Diagnosis not present

## 2020-03-06 DIAGNOSIS — M5136 Other intervertebral disc degeneration, lumbar region: Secondary | ICD-10-CM | POA: Diagnosis not present

## 2020-03-06 DIAGNOSIS — M9904 Segmental and somatic dysfunction of sacral region: Secondary | ICD-10-CM | POA: Diagnosis not present

## 2020-03-06 DIAGNOSIS — M5442 Lumbago with sciatica, left side: Secondary | ICD-10-CM | POA: Diagnosis not present

## 2020-03-06 DIAGNOSIS — M9902 Segmental and somatic dysfunction of thoracic region: Secondary | ICD-10-CM | POA: Diagnosis not present

## 2020-03-06 DIAGNOSIS — M62830 Muscle spasm of back: Secondary | ICD-10-CM | POA: Diagnosis not present

## 2020-03-06 DIAGNOSIS — M5137 Other intervertebral disc degeneration, lumbosacral region: Secondary | ICD-10-CM | POA: Diagnosis not present

## 2020-03-13 DIAGNOSIS — M5137 Other intervertebral disc degeneration, lumbosacral region: Secondary | ICD-10-CM | POA: Diagnosis not present

## 2020-03-13 DIAGNOSIS — M62830 Muscle spasm of back: Secondary | ICD-10-CM | POA: Diagnosis not present

## 2020-03-13 DIAGNOSIS — M5442 Lumbago with sciatica, left side: Secondary | ICD-10-CM | POA: Diagnosis not present

## 2020-03-13 DIAGNOSIS — M9902 Segmental and somatic dysfunction of thoracic region: Secondary | ICD-10-CM | POA: Diagnosis not present

## 2020-03-13 DIAGNOSIS — M9904 Segmental and somatic dysfunction of sacral region: Secondary | ICD-10-CM | POA: Diagnosis not present

## 2020-03-13 DIAGNOSIS — M5136 Other intervertebral disc degeneration, lumbar region: Secondary | ICD-10-CM | POA: Diagnosis not present

## 2020-03-13 DIAGNOSIS — M9903 Segmental and somatic dysfunction of lumbar region: Secondary | ICD-10-CM | POA: Diagnosis not present

## 2020-03-21 DIAGNOSIS — N200 Calculus of kidney: Secondary | ICD-10-CM | POA: Diagnosis not present

## 2020-04-18 DIAGNOSIS — N200 Calculus of kidney: Secondary | ICD-10-CM | POA: Diagnosis not present

## 2020-04-19 DIAGNOSIS — M9903 Segmental and somatic dysfunction of lumbar region: Secondary | ICD-10-CM | POA: Diagnosis not present

## 2020-04-19 DIAGNOSIS — M9904 Segmental and somatic dysfunction of sacral region: Secondary | ICD-10-CM | POA: Diagnosis not present

## 2020-04-19 DIAGNOSIS — M5136 Other intervertebral disc degeneration, lumbar region: Secondary | ICD-10-CM | POA: Diagnosis not present

## 2020-04-19 DIAGNOSIS — M9902 Segmental and somatic dysfunction of thoracic region: Secondary | ICD-10-CM | POA: Diagnosis not present

## 2020-04-19 DIAGNOSIS — M5137 Other intervertebral disc degeneration, lumbosacral region: Secondary | ICD-10-CM | POA: Diagnosis not present

## 2020-04-19 DIAGNOSIS — M5442 Lumbago with sciatica, left side: Secondary | ICD-10-CM | POA: Diagnosis not present

## 2020-04-19 DIAGNOSIS — M62830 Muscle spasm of back: Secondary | ICD-10-CM | POA: Diagnosis not present

## 2020-04-21 DIAGNOSIS — N200 Calculus of kidney: Secondary | ICD-10-CM | POA: Diagnosis not present

## 2020-04-21 DIAGNOSIS — R3129 Other microscopic hematuria: Secondary | ICD-10-CM | POA: Diagnosis not present

## 2020-04-24 DIAGNOSIS — N200 Calculus of kidney: Secondary | ICD-10-CM | POA: Diagnosis not present

## 2020-05-15 DIAGNOSIS — M62830 Muscle spasm of back: Secondary | ICD-10-CM | POA: Diagnosis not present

## 2020-05-15 DIAGNOSIS — M5442 Lumbago with sciatica, left side: Secondary | ICD-10-CM | POA: Diagnosis not present

## 2020-05-15 DIAGNOSIS — M5137 Other intervertebral disc degeneration, lumbosacral region: Secondary | ICD-10-CM | POA: Diagnosis not present

## 2020-05-15 DIAGNOSIS — M9902 Segmental and somatic dysfunction of thoracic region: Secondary | ICD-10-CM | POA: Diagnosis not present

## 2020-05-15 DIAGNOSIS — M9904 Segmental and somatic dysfunction of sacral region: Secondary | ICD-10-CM | POA: Diagnosis not present

## 2020-05-15 DIAGNOSIS — M5136 Other intervertebral disc degeneration, lumbar region: Secondary | ICD-10-CM | POA: Diagnosis not present

## 2020-05-15 DIAGNOSIS — M9903 Segmental and somatic dysfunction of lumbar region: Secondary | ICD-10-CM | POA: Diagnosis not present

## 2020-06-12 DIAGNOSIS — Z5321 Procedure and treatment not carried out due to patient leaving prior to being seen by health care provider: Secondary | ICD-10-CM | POA: Diagnosis not present

## 2020-06-12 DIAGNOSIS — M5137 Other intervertebral disc degeneration, lumbosacral region: Secondary | ICD-10-CM | POA: Diagnosis not present

## 2020-06-12 DIAGNOSIS — M5136 Other intervertebral disc degeneration, lumbar region: Secondary | ICD-10-CM | POA: Diagnosis not present

## 2020-06-12 DIAGNOSIS — M62830 Muscle spasm of back: Secondary | ICD-10-CM | POA: Diagnosis not present

## 2020-06-12 DIAGNOSIS — M9904 Segmental and somatic dysfunction of sacral region: Secondary | ICD-10-CM | POA: Diagnosis not present

## 2020-06-12 DIAGNOSIS — R079 Chest pain, unspecified: Secondary | ICD-10-CM | POA: Diagnosis not present

## 2020-06-12 DIAGNOSIS — M9903 Segmental and somatic dysfunction of lumbar region: Secondary | ICD-10-CM | POA: Diagnosis not present

## 2020-06-12 DIAGNOSIS — M5442 Lumbago with sciatica, left side: Secondary | ICD-10-CM | POA: Diagnosis not present

## 2020-06-12 DIAGNOSIS — I214 Non-ST elevation (NSTEMI) myocardial infarction: Secondary | ICD-10-CM | POA: Diagnosis not present

## 2020-06-12 DIAGNOSIS — M9902 Segmental and somatic dysfunction of thoracic region: Secondary | ICD-10-CM | POA: Diagnosis not present

## 2020-06-13 DIAGNOSIS — N182 Chronic kidney disease, stage 2 (mild): Secondary | ICD-10-CM | POA: Diagnosis not present

## 2020-06-13 DIAGNOSIS — Z6837 Body mass index (BMI) 37.0-37.9, adult: Secondary | ICD-10-CM | POA: Diagnosis not present

## 2020-06-13 DIAGNOSIS — E8881 Metabolic syndrome: Secondary | ICD-10-CM | POA: Diagnosis not present

## 2020-06-13 DIAGNOSIS — E119 Type 2 diabetes mellitus without complications: Secondary | ICD-10-CM | POA: Diagnosis not present

## 2020-06-13 DIAGNOSIS — N1832 Chronic kidney disease, stage 3b: Secondary | ICD-10-CM | POA: Diagnosis not present

## 2020-06-13 DIAGNOSIS — I249 Acute ischemic heart disease, unspecified: Secondary | ICD-10-CM | POA: Diagnosis not present

## 2020-06-13 DIAGNOSIS — I25110 Atherosclerotic heart disease of native coronary artery with unstable angina pectoris: Secondary | ICD-10-CM | POA: Diagnosis not present

## 2020-06-13 DIAGNOSIS — D62 Acute posthemorrhagic anemia: Secondary | ICD-10-CM | POA: Diagnosis not present

## 2020-06-13 DIAGNOSIS — Z20822 Contact with and (suspected) exposure to covid-19: Secondary | ICD-10-CM | POA: Diagnosis not present

## 2020-06-13 DIAGNOSIS — E785 Hyperlipidemia, unspecified: Secondary | ICD-10-CM | POA: Diagnosis not present

## 2020-06-13 DIAGNOSIS — Z8249 Family history of ischemic heart disease and other diseases of the circulatory system: Secondary | ICD-10-CM | POA: Diagnosis not present

## 2020-06-13 DIAGNOSIS — J9383 Other pneumothorax: Secondary | ICD-10-CM | POA: Diagnosis not present

## 2020-06-13 DIAGNOSIS — I10 Essential (primary) hypertension: Secondary | ICD-10-CM | POA: Diagnosis not present

## 2020-06-13 DIAGNOSIS — I129 Hypertensive chronic kidney disease with stage 1 through stage 4 chronic kidney disease, or unspecified chronic kidney disease: Secondary | ICD-10-CM | POA: Diagnosis not present

## 2020-06-13 DIAGNOSIS — Z7984 Long term (current) use of oral hypoglycemic drugs: Secondary | ICD-10-CM | POA: Diagnosis not present

## 2020-06-13 DIAGNOSIS — R079 Chest pain, unspecified: Secondary | ICD-10-CM | POA: Diagnosis not present

## 2020-06-13 DIAGNOSIS — N189 Chronic kidney disease, unspecified: Secondary | ICD-10-CM | POA: Diagnosis not present

## 2020-06-13 DIAGNOSIS — I214 Non-ST elevation (NSTEMI) myocardial infarction: Secondary | ICD-10-CM | POA: Diagnosis not present

## 2020-06-13 DIAGNOSIS — I361 Nonrheumatic tricuspid (valve) insufficiency: Secondary | ICD-10-CM | POA: Diagnosis not present

## 2020-06-13 DIAGNOSIS — Z825 Family history of asthma and other chronic lower respiratory diseases: Secondary | ICD-10-CM | POA: Diagnosis not present

## 2020-06-13 DIAGNOSIS — E669 Obesity, unspecified: Secondary | ICD-10-CM | POA: Diagnosis not present

## 2020-06-13 DIAGNOSIS — E1122 Type 2 diabetes mellitus with diabetic chronic kidney disease: Secondary | ICD-10-CM | POA: Diagnosis not present

## 2020-06-13 DIAGNOSIS — Z79899 Other long term (current) drug therapy: Secondary | ICD-10-CM | POA: Diagnosis not present

## 2020-06-13 DIAGNOSIS — I4891 Unspecified atrial fibrillation: Secondary | ICD-10-CM | POA: Diagnosis not present

## 2020-06-13 DIAGNOSIS — M549 Dorsalgia, unspecified: Secondary | ICD-10-CM | POA: Diagnosis not present

## 2020-06-13 DIAGNOSIS — I2510 Atherosclerotic heart disease of native coronary artery without angina pectoris: Secondary | ICD-10-CM | POA: Diagnosis not present

## 2020-06-14 ENCOUNTER — Inpatient Hospital Stay (HOSPITAL_COMMUNITY)
Admission: AD | Admit: 2020-06-14 | Discharge: 2020-06-25 | DRG: 234 | Disposition: A | Discharge status: Home / Self Care | Payer: Medicare HMO | Source: Other Acute Inpatient Hospital | Attending: Thoracic Surgery (Cardiothoracic Vascular Surgery) | Admitting: Thoracic Surgery (Cardiothoracic Vascular Surgery)

## 2020-06-14 ENCOUNTER — Encounter (HOSPITAL_COMMUNITY): Payer: Self-pay | Admitting: Cardiovascular Disease

## 2020-06-14 ENCOUNTER — Other Ambulatory Visit (HOSPITAL_COMMUNITY): Payer: Medicare HMO

## 2020-06-14 ENCOUNTER — Encounter (HOSPITAL_COMMUNITY)
Admission: AD | Disposition: A | Discharge status: Home / Self Care | Payer: Self-pay | Source: Other Acute Inpatient Hospital | Attending: Thoracic Surgery (Cardiothoracic Vascular Surgery)

## 2020-06-14 DIAGNOSIS — J9383 Other pneumothorax: Secondary | ICD-10-CM | POA: Diagnosis not present

## 2020-06-14 DIAGNOSIS — R531 Weakness: Secondary | ICD-10-CM | POA: Diagnosis not present

## 2020-06-14 DIAGNOSIS — R079 Chest pain, unspecified: Secondary | ICD-10-CM

## 2020-06-14 DIAGNOSIS — I214 Non-ST elevation (NSTEMI) myocardial infarction: Principal | ICD-10-CM | POA: Diagnosis present

## 2020-06-14 DIAGNOSIS — I2510 Atherosclerotic heart disease of native coronary artery without angina pectoris: Secondary | ICD-10-CM | POA: Diagnosis not present

## 2020-06-14 DIAGNOSIS — N1832 Chronic kidney disease, stage 3b: Secondary | ICD-10-CM | POA: Diagnosis present

## 2020-06-14 DIAGNOSIS — Z20822 Contact with and (suspected) exposure to covid-19: Secondary | ICD-10-CM | POA: Diagnosis present

## 2020-06-14 DIAGNOSIS — Z01811 Encounter for preprocedural respiratory examination: Secondary | ICD-10-CM

## 2020-06-14 DIAGNOSIS — D62 Acute posthemorrhagic anemia: Secondary | ICD-10-CM | POA: Diagnosis not present

## 2020-06-14 DIAGNOSIS — E669 Obesity, unspecified: Secondary | ICD-10-CM | POA: Diagnosis not present

## 2020-06-14 DIAGNOSIS — I10 Essential (primary) hypertension: Secondary | ICD-10-CM | POA: Diagnosis not present

## 2020-06-14 DIAGNOSIS — I25110 Atherosclerotic heart disease of native coronary artery with unstable angina pectoris: Secondary | ICD-10-CM | POA: Diagnosis not present

## 2020-06-14 DIAGNOSIS — E1122 Type 2 diabetes mellitus with diabetic chronic kidney disease: Secondary | ICD-10-CM | POA: Diagnosis present

## 2020-06-14 DIAGNOSIS — Z6837 Body mass index (BMI) 37.0-37.9, adult: Secondary | ICD-10-CM | POA: Diagnosis not present

## 2020-06-14 DIAGNOSIS — Z825 Family history of asthma and other chronic lower respiratory diseases: Secondary | ICD-10-CM | POA: Diagnosis not present

## 2020-06-14 DIAGNOSIS — I129 Hypertensive chronic kidney disease with stage 1 through stage 4 chronic kidney disease, or unspecified chronic kidney disease: Secondary | ICD-10-CM | POA: Diagnosis present

## 2020-06-14 DIAGNOSIS — Z951 Presence of aortocoronary bypass graft: Secondary | ICD-10-CM

## 2020-06-14 DIAGNOSIS — J439 Emphysema, unspecified: Secondary | ICD-10-CM | POA: Diagnosis not present

## 2020-06-14 DIAGNOSIS — I4891 Unspecified atrial fibrillation: Secondary | ICD-10-CM | POA: Diagnosis not present

## 2020-06-14 DIAGNOSIS — I340 Nonrheumatic mitral (valve) insufficiency: Secondary | ICD-10-CM | POA: Diagnosis not present

## 2020-06-14 DIAGNOSIS — J9811 Atelectasis: Secondary | ICD-10-CM | POA: Diagnosis not present

## 2020-06-14 DIAGNOSIS — E785 Hyperlipidemia, unspecified: Secondary | ICD-10-CM | POA: Diagnosis not present

## 2020-06-14 DIAGNOSIS — Z8249 Family history of ischemic heart disease and other diseases of the circulatory system: Secondary | ICD-10-CM

## 2020-06-14 DIAGNOSIS — I249 Acute ischemic heart disease, unspecified: Secondary | ICD-10-CM | POA: Diagnosis not present

## 2020-06-14 DIAGNOSIS — J939 Pneumothorax, unspecified: Secondary | ICD-10-CM

## 2020-06-14 DIAGNOSIS — Z01818 Encounter for other preprocedural examination: Secondary | ICD-10-CM

## 2020-06-14 DIAGNOSIS — I361 Nonrheumatic tricuspid (valve) insufficiency: Secondary | ICD-10-CM | POA: Diagnosis not present

## 2020-06-14 DIAGNOSIS — J90 Pleural effusion, not elsewhere classified: Secondary | ICD-10-CM

## 2020-06-14 DIAGNOSIS — E119 Type 2 diabetes mellitus without complications: Secondary | ICD-10-CM | POA: Diagnosis not present

## 2020-06-14 DIAGNOSIS — I517 Cardiomegaly: Secondary | ICD-10-CM | POA: Diagnosis not present

## 2020-06-14 DIAGNOSIS — E8881 Metabolic syndrome: Secondary | ICD-10-CM | POA: Diagnosis not present

## 2020-06-14 DIAGNOSIS — N189 Chronic kidney disease, unspecified: Secondary | ICD-10-CM | POA: Diagnosis not present

## 2020-06-14 HISTORY — DX: Essential (primary) hypertension: I10

## 2020-06-14 HISTORY — DX: Non-ST elevation (NSTEMI) myocardial infarction: I21.4

## 2020-06-14 HISTORY — DX: Type 2 diabetes mellitus without complications: E11.9

## 2020-06-14 HISTORY — DX: Hyperlipidemia, unspecified: E78.5

## 2020-06-14 HISTORY — PX: LEFT HEART CATH AND CORONARY ANGIOGRAPHY: CATH118249

## 2020-06-14 LAB — GLUCOSE, CAPILLARY
Glucose-Capillary: 144 mg/dL — ABNORMAL HIGH (ref 70–99)
Glucose-Capillary: 176 mg/dL — ABNORMAL HIGH (ref 70–99)

## 2020-06-14 SURGERY — LEFT HEART CATH AND CORONARY ANGIOGRAPHY
Anesthesia: LOCAL

## 2020-06-14 MED ORDER — MIDAZOLAM HCL 2 MG/2ML IJ SOLN
INTRAMUSCULAR | Status: DC | PRN
  Administered 2020-06-14: 2 mg via INTRAVENOUS

## 2020-06-14 MED ORDER — HEPARIN (PORCINE) IN NACL 1000-0.9 UT/500ML-% IV SOLN
INTRAVENOUS | Status: DC | PRN
  Administered 2020-06-14 (×2): 500 mL

## 2020-06-14 MED ORDER — SODIUM CHLORIDE 0.9% FLUSH: 3.0000 mL | INTRAVENOUS | Status: DC | PRN

## 2020-06-14 MED ORDER — LIDOCAINE HCL (PF) 1 % IJ SOLN
INTRAMUSCULAR | Status: AC
  Filled 2020-06-14: qty 30

## 2020-06-14 MED ORDER — ASPIRIN 81 MG PO CHEW
CHEWABLE_TABLET | ORAL | Status: AC
  Filled 2020-06-14: qty 4

## 2020-06-14 MED ORDER — ATORVASTATIN CALCIUM 80 MG PO TABS
80.0000 mg | ORAL_TABLET | Freq: Every day | ORAL | Status: DC
  Administered 2020-06-14 – 2020-06-25 (×11): 80 mg via ORAL
  Filled 2020-06-14 (×11): qty 1

## 2020-06-14 MED ORDER — LABETALOL HCL 5 MG/ML IV SOLN: 10.0000 mg | INTRAVENOUS | Status: AC | PRN

## 2020-06-14 MED ORDER — HEPARIN (PORCINE) IN NACL 1000-0.9 UT/500ML-% IV SOLN
INTRAVENOUS | Status: AC
  Filled 2020-06-14: qty 1000

## 2020-06-14 MED ORDER — SODIUM CHLORIDE 0.9% FLUSH
3.0000 mL | Freq: Two times a day (BID) | INTRAVENOUS | Status: DC
  Administered 2020-06-14 – 2020-06-18 (×4): 3 mL via INTRAVENOUS

## 2020-06-14 MED ORDER — SODIUM CHLORIDE 0.9 % IV SOLN: INTRAVENOUS | Status: AC

## 2020-06-14 MED ORDER — ASPIRIN 81 MG PO CHEW
CHEWABLE_TABLET | ORAL | Status: DC | PRN
  Administered 2020-06-14: 324 mg via ORAL

## 2020-06-14 MED ORDER — ONDANSETRON HCL 4 MG/2ML IJ SOLN: 4.0000 mg | Freq: Four times a day (QID) | INTRAMUSCULAR | Status: DC | PRN

## 2020-06-14 MED ORDER — VERAPAMIL HCL 2.5 MG/ML IV SOLN
INTRAVENOUS | Status: DC | PRN
  Administered 2020-06-14: 10 mL via INTRA_ARTERIAL

## 2020-06-14 MED ORDER — VERAPAMIL HCL 2.5 MG/ML IV SOLN
INTRAVENOUS | Status: AC
  Filled 2020-06-14: qty 2

## 2020-06-14 MED ORDER — IOHEXOL 350 MG/ML SOLN
INTRAVENOUS | Status: DC | PRN
  Administered 2020-06-14: 125 mL

## 2020-06-14 MED ORDER — HEPARIN SODIUM (PORCINE) 1000 UNIT/ML IJ SOLN
INTRAMUSCULAR | Status: AC
  Filled 2020-06-14: qty 1

## 2020-06-14 MED ORDER — METOPROLOL TARTRATE 25 MG PO TABS
25.0000 mg | ORAL_TABLET | Freq: Two times a day (BID) | ORAL | Status: DC
  Administered 2020-06-14 – 2020-06-18 (×9): 25 mg via ORAL
  Filled 2020-06-14 (×10): qty 1

## 2020-06-14 MED ORDER — HEPARIN (PORCINE) 25000 UT/250ML-% IV SOLN
2200.0000 [IU]/h | INTRAVENOUS | Status: DC
  Administered 2020-06-14: 1300 [IU]/h via INTRAVENOUS
  Administered 2020-06-17: 1800 [IU]/h via INTRAVENOUS
  Administered 2020-06-17 – 2020-06-18 (×2): 2000 [IU]/h via INTRAVENOUS
  Administered 2020-06-18: 2200 [IU]/h via INTRAVENOUS
  Filled 2020-06-14 (×6): qty 250

## 2020-06-14 MED ORDER — SODIUM CHLORIDE 0.9 % IV SOLN: 250.0000 mL | INTRAVENOUS | Status: DC | PRN

## 2020-06-14 MED ORDER — MIDAZOLAM HCL 2 MG/2ML IJ SOLN
INTRAMUSCULAR | Status: AC
  Filled 2020-06-14: qty 2

## 2020-06-14 MED ORDER — HEPARIN SODIUM (PORCINE) 1000 UNIT/ML IJ SOLN
INTRAMUSCULAR | Status: DC | PRN
  Administered 2020-06-14: 6000 [IU] via INTRAVENOUS

## 2020-06-14 MED ORDER — FENTANYL CITRATE (PF) 100 MCG/2ML IJ SOLN
INTRAMUSCULAR | Status: AC
  Filled 2020-06-14: qty 2

## 2020-06-14 MED ORDER — ACETAMINOPHEN 325 MG PO TABS: 650.0000 mg | ORAL_TABLET | ORAL | Status: DC | PRN

## 2020-06-14 MED ORDER — FENTANYL CITRATE (PF) 100 MCG/2ML IJ SOLN
INTRAMUSCULAR | Status: DC | PRN
Start: 2020-06-14 — End: 2020-06-14
  Administered 2020-06-14: 50 ug via INTRAVENOUS

## 2020-06-14 MED ORDER — HYDRALAZINE HCL 20 MG/ML IJ SOLN: 10.0000 mg | INTRAMUSCULAR | Status: AC | PRN

## 2020-06-14 MED ORDER — ASPIRIN 81 MG PO CHEW
81.0000 mg | CHEWABLE_TABLET | Freq: Every day | ORAL | Status: DC
  Administered 2020-06-15 – 2020-06-18 (×4): 81 mg via ORAL
  Filled 2020-06-14 (×4): qty 1

## 2020-06-14 MED ORDER — LIDOCAINE HCL (PF) 1 % IJ SOLN
INTRAMUSCULAR | Status: DC | PRN
  Administered 2020-06-14: 2 mL

## 2020-06-14 SURGICAL SUPPLY — 10 items
CATH 5FR JL3.5 JR4 ANG PIG MP (CATHETERS) ×2 IMPLANT
CATH VISTA GUIDE 6FR XBLAD3.5 (CATHETERS) ×2 IMPLANT
DEVICE RAD COMP TR BAND LRG (VASCULAR PRODUCTS) ×2 IMPLANT
GLIDESHEATH SLEND SS 6F .021 (SHEATH) ×2 IMPLANT
GUIDEWIRE INQWIRE 1.5J.035X260 (WIRE) ×1 IMPLANT
INQWIRE 1.5J .035X260CM (WIRE) ×2
KIT HEART LEFT (KITS) ×2 IMPLANT
PACK CARDIAC CATHETERIZATION (CUSTOM PROCEDURE TRAY) ×2 IMPLANT
TRANSDUCER W/STOPCOCK (MISCELLANEOUS) ×2 IMPLANT
TUBING CIL FLEX 10 FLL-RA (TUBING) ×2 IMPLANT

## 2020-06-14 NOTE — Progress Notes (Signed)
ANTICOAGULATION CONSULT NOTE - Initial Consult  Pharmacy Consult for IV Heparin Indication: chest pain/ACS  No Known Allergies  Patient Measurements: Per pharmacist at [facility]: Weight: 123.5 kg (121.6 kg here); Height: 74 inches Heparin Dosing Weight: 108.5 kg  Vital Signs: Temp: 98 F (36.7 C) (11/03 1636) Temp Source: Oral (11/03 1636) BP: 117/95 (11/03 1636) Pulse Rate: 78 (11/03 1636)  Labs: No results for input(s): HGB, HCT, PLT, APTT, LABPROT, INR, HEPARINUNFRC, HEPRLOWMOCWT, CREATININE, CKTOTAL, CKMB, TROPONINIHS in the last 72 hours.  CrCl cannot be calculated (No successful lab value found.).   Medical History: Past Medical History:  Diagnosis Date  . Diabetes (HCC)   . Hyperlipidemia   . Hypertension     Assessment: 74 yr old man transferred from [facility] to [facility] for cardiac cath today, which showed severe 3-vessel CAD. Cardiac surgery consult has been ordered for CABG; pharmacy was consulted to start heparin 8 hrs after sheath pull (per cath procedure log, sheath was removed at 1351 PM this afternoon).  Spoke with pharmacist at [facility], who informed me that pt was on therapeutic Lovenox there (125 mg SQ Q 12 hrs, with last dose charted there at 0220 AM today). [place] lab data: H/H 12.6/37.4, platelets 147, Scr 1.4, CrCl 62 ml/min.  Per pt, he was not taking any anticoagulants PTA to [facility].  Goal of Therapy:  Heparin level 0.3-0.7 units/ml Monitor platelets by anticoagulation protocol: Yes   Plan:  Start heparin infusion (no bolus) at 1300 units/hr at 2200 PM this evening Check heparin level 8 hrs after starting heparin infusion Monitor daily heparin level, CBC F/U CABG evaluation  Vicki Mallet, PharmD, BCPS, [facility] Clinical Pharmacist 06/14/2020,4:56 PM

## 2020-06-14 NOTE — Consult Note (Addendum)
[address]       [place] [address]             [phone]        KHAIR CHASTEEN [facility] Medical Record #[id number] Date of Birth: 03-07-1946  Referring: Dr. Clifton James, MD Primary Care: Street, [name], MD Primary Cardiologist:Brian Dulce Sellar, MD  Chief Complaint: Pain between the shoulder blades Reason for consultation: Severe coronary artery disease  History of Present Illness:     This is a 74 year old male with a past medical history of hypertension, diabetes mellitus, and hyperlipidemia who initially presented to [facility] on 11/02 with chest pain. The patient reports he has had multiple episodes of pain between the scapula since January 2021. He noticed it tended to occur after eating "cheesy meals" and thought it might be his gallbladder. Associated with this at times was nausea. He has low back pain and sees a Land. The pain between his shoulder blades seemed to "go away" after his adjustment.;however, it returned at 11 pm so he went to a local Urgent Care. He was advised to go to [facility] ED for further evaluation as there was a concern for ACS. The patient states he was tired of waiting in the ED (EKG and labs were done) so he returned home to "feed his animals and watch tv". He ultimately returned to [facility] ED and was instructed he would be admitted.   EKG in ED showed non specific T wave changes. Troponin I (high sensitivity) initially was 0.93 and increased to a maximum of 2.75. He was also noted to have elevated D dimer but CT angio showed no PE. His creatinine was 1.4 at [facility]. Echo done showed no significant valvular disease or pericardial effusion. He ultimately ruled in for a NSTEMI and was transferred to [facility] for further evaluation and treatment.   Dr. Cliffton Asters has been consulted for consideration of coronary artery bypass grafting surgery.  At the time of my exam, patient's vital signs are stable and he  denies pain between the shoulder blades, chest pain, or shortness of breath.   Current Activity/ Functional Status: Patient is independent with mobility/ambulation, transfers, ADL's, IADL's.   Zubrod Score: At the time of surgery this patient's most appropriate activity status/level should be described as: []     0    Normal activity, no symptoms [x]     1    Restricted in physical strenuous activity but ambulatory, able to do out light work []     2    Ambulatory and capable of self care, unable to do work activities, up and about more than 50%  Of the time                            []     3    Only limited self care, in bed greater than 50% of waking hours []     4    Completely disabled, no self care, confined to bed or chair []     5    Moribund  Past Medical History:  Diagnosis Date  . Diabetes (HCC)   . Hyperlipidemia   . Hypertension   Right kidney stone  Past Surgical History:  Procedure Laterality Date  . HERNIA REPAIR     x 3 (b/l inguinal and umbilical)  . PROSTATE SURGERY . Both of theses surgeries were done at the same time last summer.  Lithotripsy for kidney stone July 2021  Social History   Tobacco Use  Smoking Status He smoked tobacco for a few months when he was 26    Social History   Substance and Sexual Activity  Alcohol Use Nightly wine after dinner    Allergies: No Known Allergies  Current Facility-Administered Medications  Medication Dose Route Frequency Provider Last Rate Last Admin  . 0.9 %  sodium chloride infusion   Intravenous Continuous Kathleene Hazel, MD 75 mL/hr at 06/14/20 1422 75 mL/hr at 06/14/20 1422  . ondansetron (ZOFRAN) injection 4 mg  4 mg Intravenous Q6H PRN Kathleene Hazel, MD         Family History  Problem Relation Age of Onset  . Heart attack Father   Tobacco abuse in both mother and father. Mother had emphysema and died around age 106. Father died around age 46.  Review of Systems:       Cardiac  Review of Systems: Y or  [ N   ]= no  Chest Pain [  N  ]  Exertional SOB  [ SOMETIMES ]     Pedal Edema Klaus.Mock ]    Palpitations [ N ] Syncope  Klaus.Mock  ]   Presyncope Klaus.Mock   ]  General Review of Systems: [Y] = yes [  ]=no Constitional:  anorexia [ N ]; fatigue [Y  ]; nausea [  N]; night sweats Klaus.Mock  ]; fever [  N]; or chills Klaus.Mock  ]                                                               Dental: Last Dentist visit: Many years ago. He has multiple missing teeth   Eye :  Amaurosis fugax[ N ]; Resp: cough [ N ];  wheezing[ N ];  hemoptysis[N  ];  GI:   vomiting[ N ];  dysphagia[ N ]; melena[ N ];  hematochezia [N  ]; heartburn[  ];    GU: kidney stones [ Y ]; hematuria[ N ];                Skin: rash, swelling[N  ];,or itching[N  ]; Musculosketetal:  back pain[ Y ];  Heme/Lymph:  anemia[ N ];  Neuro: TIA[ N ];  stroke[N  ];  vertigo[N  ];  seizures[N  ];     Psych:depression[ N]; anxiety[ N ];  Endocrine: diabetes[ Y ];  thyroid dysfunction[ N ];            Patient has had COVID vaccine     Physical Exam: BP 130/87   Pulse 77   Resp 18   SpO2 98%    General appearance: alert, cooperative and no distress Head: Normocephalic, without obvious abnormality, atraumatic Neck: no carotid bruit, no JVD and supple, symmetrical, trachea midline Resp: clear to auscultation bilaterally Cardio: RRR, no murmur GI: Soft, obese, non tender, bowel sounds Extremities: Mild ankle edema. Palpable DP/PT bilaterally Neurologic: Grossly normal  Diagnostic Studies & Laboratory data:     Recent Radiology Findings:   CARDIAC CATHETERIZATION  Result Date: 06/14/2020  Mid RCA to Dist RCA lesion is 80% stenosed.  Dist RCA lesion is 40% stenosed.  RPDA lesion is 99% stenosed.  1st Mrg lesion is 50% stenosed.  2nd  Mrg lesion is 99% stenosed.  Dist Cx lesion is 100% stenosed.  Ost Cx lesion is 80% stenosed.  Prox LAD lesion is 70% stenosed.  1st Diag-1 lesion is 70% stenosed.  1st Diag-2 lesion is 60%  stenosed.  Dist LAD lesion is 40% stenosed.  Severe three vessel CAD Moderately severe, eccentric mid LAD stenosis involving the moderate to large caliber diagonal branch which also has severe ostial/proximal disease. The Circumflex is a very large caliber vessel (35mm). There is severe ostial stenosis. The first OM branch has severe disease. The distal AV groove Circumflex is occluded and fills from right to left collaterals. The RCA is a large dominant vessel with severe mid stenosis in the bend of the vessel and severe PDA stenosis. Recommendations: He has severe three vessel CAD. The LAD lesion is moderately severe and involves the ostium of a moderate to large caliber diagonal branch. The Diagonal branch also has severe proximal disease. This would be high risk for PCI. The ostial Circumflex is a very large vessel (6 mm) with severe ostial stenosis and not favorable for PCI. The large dominant RCA has several segments of severe disease. -I think CABG is the best revascularization option. I will ask CT surgery to see him to discuss CABG. -Repeat echo here at [facility]. He had an echo at [place] but it was a poor study per report and the images are not available to review. -Continue ASA, statin and beta blocker. -Sliding scale insulin coverage. -Restart IV heparin 8 hours post sheath pull.   Intervention     I have independently reviewed the above radiologic studies and discussed with the patient   Recent Lab Findings: No results found for: WBC, HGB, HCT, PLT, GLUCOSE, CHOL, TRIG, HDL, LDLDIRECT, LDLCALC, ALT, AST, NA, K, CL, CREATININE, BUN, CO2, TSH, INR, GLUF, HGBA1C   Assessment / Plan:   1. S/p NSTEMI, coronary artery disease-will be put on Heparin drip. Revascularization is likely best option as PCI very high risk. Patient is scheduled for first available on Monday 06/19/2020. 2. History of diabetes mellitus-he was on Metformin 1000 mg bid prior to admission. Check HGA1C 3. Hyperlipidemia-was put  on high dose statin at [facility]. 4. History of hypertension-on Lisinopril 20 mg daily and Spironolactone 25 mg daily prior to admission. Likely hold both until labs resulted as creatinine at [facility] 1.4 (previous IV contrast for CT).   I  spent 25 minutes counseling the patient face to face.   Doree Fudge PA-C 06/14/2020 2:56 PM    Agree with above This is a 74 year old male that is transferred from [facility] following an NSTEMI.  He underwent a left heart catheterization which shows severe three-vessel disease.  On review of his images he has good-quality vessels for bypass on all of his walls.  He is currently asymptomatic on heparin drip in sinus rhythm.  We will plan for surgical bypass on 06/19/2020.

## 2020-06-14 NOTE — Progress Notes (Signed)
Received pt alert and oriented X4, skin warm and dry, resp even and unlabored. p t denies any chest pain at this time.  PA Mardella Layman in to talk to patient. IVF infusing, pt on monitor, and consent signed.

## 2020-06-14 NOTE — H&P (Addendum)
Cardiology Admission History and Physical:   Patient ID: Larry May MRN: [id number]; DOB: 1946/06/16   Admission date: 06/14/2020  Primary Care Provider: Street, [name], MD [facility] Cardiologist: Norman Herrlich, MD  [facility] Electrophysiologist:  None   Chief Complaint:  Chest pain  Patient Profile:   Larry May is a 74 y.o. male with HTN, diabetes and HLD who presented to [facility] with chest pain and found to have a NSTEMI. Transferred to [facility] for further management.   History of Present Illness:   Larry May is a 74 yo male with PMH of HTN, HLD and DM.  He presented to [facility] on 11/2 with chest pain.  He was evaluated by Dr. Dulce Sellar.  Noted family history with father having MI at the age of 91.  Reported a nagging pain between his scapula that initially seemed to be associated with meals.  The day prior to admission he had a prolonged episode that lasted throughout the day which radiated into his left arm.  EKG in the ED showed nonspecific T wave changes.  Labs showed stable electrolytes, creatinine 1.4, troponin I 0.93>> 1.86>> 2.75.  EKG showed SR with TWI in lateral leads. Noted to have elevated D-dimer on admission, but follow-up CT angio showed no PE.  Echocardiogram showed normal LV size and systolic function, though unable to exclude regional wall motion abnormality, right atrium and aortic valve secondary to technical limitations. Given his rising troponin decision was made to transfer to [facility] for further management with cardiac catheterization.    Past Medical History:  Diagnosis Date  . Diabetes (HCC)   . Hyperlipidemia   . Hypertension     Past Surgical History:  Procedure Laterality Date  . HERNIA REPAIR     x 3  . PROSTATE SURGERY        Medications Prior to Admission:  Metformin 1000 mg po BID Lisinopril 20 mg daily Spironolactone 25 mg daily  Allergies:   No Known Allergies  Social History:    Social History   Socioeconomic History  . Marital status: Divorced    Spouse name: Not on file  . Number of children: Not on file  . Years of education: Not on file  . Highest education level: Not on file  Occupational History  . Not on file  Tobacco Use  . Smoking status: Not on file  Substance and Sexual Activity  . Alcohol use: Not on file  . Drug use: Not on file  . Sexual activity: Not on file  Other Topics Concern  . Not on file  Social History Narrative  . Not on file   Social Determinants of Health   Financial Resource Strain:   . Difficulty of Paying Living Expenses: Not on file  Food Insecurity:   . Worried About Programme researcher, broadcasting/film/video in the Last Year: Not on file  . Ran Out of Food in the Last Year: Not on file  Transportation Needs:   . Lack of Transportation (Medical): Not on file  . Lack of Transportation (Non-Medical): Not on file  Physical Activity:   . Days of Exercise per Week: Not on file  . Minutes of Exercise per Session: Not on file  Stress:   . Feeling of Stress : Not on file  Social Connections:   . Frequency of Communication with Friends and Family: Not on file  . Frequency of Social Gatherings with Friends and Family: Not on file  . Attends  Religious Services: Not on file  . Active Member of Clubs or Organizations: Not on file  . Attends Banker Meetings: Not on file  . Marital Status: Not on file  Intimate Partner Violence:   . Fear of Current or Ex-Partner: Not on file  . Emotionally Abused: Not on file  . Physically Abused: Not on file  . Sexually Abused: Not on file    Family History:   The patient's family history includes Heart attack in his father.    ROS:  Please see the history of present illness.  All other ROS reviewed and negative.     Physical Exam/Data:  There were no vitals filed for this visit. No intake or output data in the 24 hours ending 06/14/20 1308 No flowsheet data found.   There is no height or  weight on file to calculate BMI.  General:  Well nourished, well developed, in no acute distress HEENT: normal Lymph: no adenopathy Neck: no JVD Endocrine:  No thryomegaly Vascular: No carotid bruits; FA pulses 2+ bilaterally without bruits  Cardiac:  normal S1, S2; RRR; no murmur  Lungs:  clear to auscultation bilaterally, no wheezing, rhonchi or rales  Abd: soft, nontender, no hepatomegaly  Ext: no edema Musculoskeletal:  No deformities, BUE and BLE strength normal and equal Skin: warm and dry  Neuro:  CNs 2-12 intact, no focal abnormalities noted Psych:  Normal affect    EKG:  The ECG that was done 06/14/20 was personally reviewed and demonstrates SR with TWI in lateral leads  Relevant CV Studies:  Echo ( at [place]) with normal EF  Laboratory Data:  High Sensitivity Troponin:  No results for input(s): TROPONINIHS in the last 720 hours.    ChemistryNo results for input(s): NA, K, CL, CO2, GLUCOSE, BUN, CREATININE, CALCIUM, GFRNONAA, GFRAA, ANIONGAP in the last 168 hours.  No results for input(s): PROT, ALBUMIN, AST, ALT, ALKPHOS, BILITOT in the last 168 hours. HematologyNo results for input(s): WBC, RBC, HGB, HCT, MCV, MCH, MCHC, RDW, PLT in the last 168 hours. BNPNo results for input(s): BNP, PROBNP in the last 168 hours.  DDimer No results for input(s): DDIMER in the last 168 hours.   Radiology/Studies:  No results found.   Assessment and Plan:   Larry May is a 74 y.o. male with HTN, diabetes and HLD who presented to [facility] with chest pain and found to have a NSTEMI. Transferred to [facility] for further management.   1. NSTEMI: presented to [facility] with chest pain, Trop I peaking >2 with EKG noting TWI in lateral leads. Seen by Dr. Dulce Sellar and transferred to [facility] for further management with plans for cardiac cath. The patient understands that risks included but are not limited to stroke (1 in 1000), death (1 in 1000), kidney failure [usually temporary]  (1 in 500), bleeding (1 in 200), allergic reaction [possibly serious] (1 in 200).  -- further management pending cath findings  2. HTN: reports being on lisinopril 20mg  daily along with spiro 25mg  daily prior to admission. With elevated Cr will hold for now. Favor adding low dose BB if HR can tolerate post procedure.   3. HLD: continue high dose statin (started at [place])  4. DM: metformin held -- SSI while inpatient    : TIMI Risk Score for Unstable Angina or Non-ST Elevation MI:   The patient's TIMI risk score is 4, which indicates a 20% risk of all cause mortality, new or recurrent myocardial infarction or need for  urgent revascularization in the next 14 days.  Severity of Illness: The appropriate patient status for this patient is INPATIENT. Inpatient status is judged to be reasonable and necessary in order to provide the required intensity of service to ensure the patient's safety. The patient's presenting symptoms, physical exam findings, and initial radiographic and laboratory data in the context of their chronic comorbidities is felt to place them at high risk for further clinical deterioration. Furthermore, it is not anticipated that the patient will be medically stable for discharge from the hospital within 2 midnights of admission. The following factors support the patient status of inpatient.   " The patient's presenting symptoms include chest pain. " The worrisome physical exam findings include stable exam. " The initial radiographic and laboratory data are worrisome because of elevated troponin. " The chronic co-morbidities include HTN, HL and DM.   * I certify that at the point of admission it is my clinical judgment that the patient will require inpatient hospital care spanning beyond 2 midnights from the point of admission due to high intensity of service, high risk for further deterioration and high frequency of surveillance required.*    For questions or  updates, please contact [facility] Please consult [email or web address] for contact info under     Signed, Verne Carrow, MD  06/14/2020 1:08 PM   I have personally seen and examined this patient. I agree with the assessment and plan as outlined above.  Admitted to [facility] with chest pain/back pain. Troponin elevated. Transferred to [facility] for cardiac cath.  Will plan cardiac cath now. Further plans to follow.   Verne Carrow 06/14/2020 1:11 PM

## 2020-06-14 NOTE — Progress Notes (Signed)
TR BAND REMOVAL  LOCATION:    Right radial  DEFLATED PER PROTOCOL:    Yes.    TIME BAND OFF / DRESSING APPLIED:    1600p a clean dry dressing applied with gauze and teagderm   SITE UPON ARRIVAL:    Level 0  SITE AFTER BAND REMOVAL:    Level 0  CIRCULATION SENSATION AND MOVEMENT:    Within Normal Limits   Yes.    COMMENTS:

## 2020-06-14 NOTE — Interval H&P Note (Signed)
History and Physical Interval Note:  06/14/2020 1:12 PM  Larry May  has presented today for surgery, with the diagnosis of chest pain.  The various methods of treatment have been discussed with the patient and family. After consideration of risks, benefits and other options for treatment, the patient has consented to  Procedure(s): LEFT HEART CATH AND CORONARY ANGIOGRAPHY (N/A) as a surgical intervention.  The patient's history has been reviewed, patient examined, no change in status, stable for surgery.  I have reviewed the patient's chart and labs.  Questions were answered to the patient's satisfaction.    Cath Lab Visit (complete for each Cath Lab visit)  Clinical Evaluation Leading to the Procedure:   ACS: Yes.    Non-ACS:    Anginal Classification: CCS III  Anti-ischemic medical therapy: No Therapy  Non-Invasive Test Results: No non-invasive testing performed  Prior CABG: No previous CABG   Verne Carrow

## 2020-06-15 ENCOUNTER — Other Ambulatory Visit (HOSPITAL_COMMUNITY): Payer: Medicare HMO

## 2020-06-15 ENCOUNTER — Encounter (HOSPITAL_COMMUNITY): Payer: Self-pay | Admitting: Cardiovascular Disease

## 2020-06-15 ENCOUNTER — Inpatient Hospital Stay (HOSPITAL_COMMUNITY): Payer: Medicare HMO

## 2020-06-15 DIAGNOSIS — I361 Nonrheumatic tricuspid (valve) insufficiency: Secondary | ICD-10-CM

## 2020-06-15 DIAGNOSIS — I214 Non-ST elevation (NSTEMI) myocardial infarction: Secondary | ICD-10-CM | POA: Diagnosis not present

## 2020-06-15 LAB — ECHOCARDIOGRAM COMPLETE
Area-P 1/2: 2.5 cm2
Height: 74 in
S' Lateral: 3.7 cm
Weight: 4289.27 oz

## 2020-06-15 LAB — BASIC METABOLIC PANEL
Anion gap: 9 (ref 5–15)
BUN: 18 mg/dL (ref 8–23)
CO2: 23 mmol/L (ref 22–32)
Calcium: 8.6 mg/dL — ABNORMAL LOW (ref 8.9–10.3)
Chloride: 105 mmol/L (ref 98–111)
Creatinine, Ser: 1.49 mg/dL — ABNORMAL HIGH (ref 0.61–1.24)
GFR, Estimated: 48 mL/min — ABNORMAL LOW (ref >60)
Glucose, Bld: 160 mg/dL — ABNORMAL HIGH (ref 70–99)
Potassium: 4.5 mmol/L (ref 3.5–5.1)
Sodium: 137 mmol/L (ref 135–145)

## 2020-06-15 LAB — HEPARIN LEVEL (UNFRACTIONATED)
Heparin Unfractionated: 0.31 IU/mL (ref 0.30–0.70)
Heparin Unfractionated: 0.18 IU/mL — ABNORMAL LOW (ref 0.30–0.70)
Heparin Unfractionated: 0.2 IU/mL — ABNORMAL LOW (ref 0.30–0.70)

## 2020-06-15 LAB — GLUCOSE, CAPILLARY
Glucose-Capillary: 129 mg/dL — ABNORMAL HIGH (ref 70–99)
Glucose-Capillary: 152 mg/dL — ABNORMAL HIGH (ref 70–99)

## 2020-06-15 LAB — HEMOGLOBIN A1C
Hgb A1c MFr Bld: 6.7 % — ABNORMAL HIGH (ref 4.8–5.6)
Mean Plasma Glucose: 145.59 mg/dL

## 2020-06-15 MED ORDER — INSULIN ASPART 100 UNIT/ML ~~LOC~~ SOLN
0.0000 [IU] | Freq: Three times a day (TID) | SUBCUTANEOUS | Status: DC
  Administered 2020-06-16 – 2020-06-17 (×4): 3 [IU] via SUBCUTANEOUS
  Administered 2020-06-17: 2 [IU] via SUBCUTANEOUS
  Administered 2020-06-17: 3 [IU] via SUBCUTANEOUS
  Administered 2020-06-18: 2 [IU] via SUBCUTANEOUS
  Administered 2020-06-18 (×2): 3 [IU] via SUBCUTANEOUS

## 2020-06-15 NOTE — Progress Notes (Addendum)
Progress Note  Patient Name: Larry May Date of Encounter: 06/15/2020  [facility] Cardiologist: Norman Herrlich, MD   Subjective   Feeling well this morning. No chest pain overnight.   Inpatient Medications    Scheduled Meds: . aspirin  81 mg Oral Daily  . atorvastatin  80 mg Oral Daily  . insulin aspart  0-15 Units Subcutaneous TID WC  . metoprolol tartrate  25 mg Oral BID  . sodium chloride flush  3 mL Intravenous Q12H   Continuous Infusions: . sodium chloride    . heparin 1,300 Units/hr (06/15/20 0600)   PRN Meds: sodium chloride, acetaminophen, ondansetron (ZOFRAN) IV, sodium chloride flush   Vital Signs    Vitals:   06/14/20 1700 06/14/20 2014 06/15/20 0248 06/15/20 0800  BP:  (!) 149/82 130/82 (!) 128/57  Pulse:  75 65 66  Resp:  19 20 13   Temp:  99 F (37.2 C) 98.4 F (36.9 C)   TempSrc:  Oral Oral   SpO2:  96% 96% 98%  Weight: 121.6 kg     Height: 6\' 2"  (1.88 m)       Intake/Output Summary (Last 24 hours) at 06/15/2020 1015 Last data filed at 06/15/2020 13/11/2019 Gross per 24 hour  Intake 825.57 ml  Output 2700 ml  Net -1874.43 ml   Last 3 Weights 06/14/2020  Weight (lbs) 268 lb 1.3 oz  Weight (kg) 121.6 kg      Telemetry    SR - Personally Reviewed  ECG    SR with 1st degree AVB - Personally Reviewed  Physical Exam  Pleasant older male GEN: No acute distress.   Neck: No JVD Cardiac: RRR, no murmurs, rubs, or gallops.  Respiratory: Clear to auscultation bilaterally. GI: Soft, nontender, non-distended  MS: No edema; No deformity. Right radial cath site.  Neuro:  Nonfocal  Psych: Normal affect   Labs    High Sensitivity Troponin:  No results for input(s): TROPONINIHS in the last 720 hours.    Chemistry Recent Labs  Lab 06/15/20 0253  NA 137  K 4.5  CL 105  CO2 23  GLUCOSE 160*  BUN 18  CREATININE 1.49*  CALCIUM 8.6*  GFRNONAA 48*  ANIONGAP 9     HematologyNo results for input(s): WBC, RBC, HGB, HCT, MCV, MCH, MCHC,  RDW, PLT in the last 168 hours.  BNPNo results for input(s): BNP, PROBNP in the last 168 hours.   DDimer No results for input(s): DDIMER in the last 168 hours.   Radiology    CARDIAC CATHETERIZATION  Result Date: 06/14/2020  Mid RCA to Dist RCA lesion is 80% stenosed.  Dist RCA lesion is 40% stenosed.  RPDA lesion is 99% stenosed.  1st Mrg lesion is 50% stenosed.  2nd Mrg lesion is 99% stenosed.  Dist Cx lesion is 100% stenosed.  Ost Cx lesion is 80% stenosed.  Prox LAD lesion is 70% stenosed.  1st Diag-1 lesion is 70% stenosed.  1st Diag-2 lesion is 60% stenosed.  Dist LAD lesion is 40% stenosed.  Severe three vessel CAD Moderately severe, eccentric mid LAD stenosis involving the moderate to large caliber diagonal branch which also has severe ostial/proximal disease. The Circumflex is a very large caliber vessel (15mm). There is severe ostial stenosis. The first OM branch has severe disease. The distal AV groove Circumflex is occluded and fills from right to left collaterals. The RCA is a large dominant vessel with severe mid stenosis in the bend of the vessel and severe PDA  stenosis. Recommendations: He has severe three vessel CAD. The LAD lesion is moderately severe and involves the ostium of a moderate to large caliber diagonal branch. The Diagonal branch also has severe proximal disease. This would be high risk for PCI. The ostial Circumflex is a very large vessel (6 mm) with severe ostial stenosis and not favorable for PCI. The large dominant RCA has several segments of severe disease. -I think CABG is the best revascularization option. I will ask CT surgery to see him to discuss CABG. -Repeat echo here at [facility]. He had an echo at [place] but it was a poor study per report and the images are not available to review. -Continue ASA, statin and beta blocker. -Sliding scale insulin coverage. -Restart IV heparin 8 hours post sheath pull.    Cardiac Studies   Cath: 06/14/20   Mid RCA to  Dist RCA lesion is 80% stenosed.  Dist RCA lesion is 40% stenosed.  RPDA lesion is 99% stenosed.  1st Mrg lesion is 50% stenosed.  2nd Mrg lesion is 99% stenosed.  Dist Cx lesion is 100% stenosed.  Ost Cx lesion is 80% stenosed.  Prox LAD lesion is 70% stenosed.  1st Diag-1 lesion is 70% stenosed.  1st Diag-2 lesion is 60% stenosed.  Dist LAD lesion is 40% stenosed.   Severe three vessel CAD Moderately severe, eccentric mid LAD stenosis involving the moderate to large caliber diagonal branch which also has severe ostial/proximal disease.  The Circumflex is a very large caliber vessel (56mm). There is severe ostial stenosis. The first OM branch has severe disease. The distal AV groove Circumflex is occluded and fills from right to left collaterals.  The RCA is a large dominant vessel with severe mid stenosis in the bend of the vessel and severe PDA stenosis.   Recommendations: He has severe three vessel CAD. The LAD lesion is moderately severe and involves the ostium of a moderate to large caliber diagonal branch. The Diagonal branch also has severe proximal disease. This would be high risk for PCI. The ostial Circumflex is a very large vessel (6 mm) with severe ostial stenosis and not favorable for PCI. The large dominant RCA has several segments of severe disease.  -I think CABG is the best revascularization option. I will ask CT surgery to see him to discuss CABG.  -Repeat echo here at [facility]. He had an echo at [place] but it was a poor study per report and the images are not available to review.  -Continue ASA, statin and beta blocker.  -Sliding scale insulin coverage.  -Restart IV heparin 8 hours post sheath pull.   Diagnostic Dominance: Right    Patient Profile     74 y.o. male with HTN, diabetes and HLD who presented to [facility] with chest pain and found to have a NSTEMI. Transferred to [facility] for further management.   Assessment & Plan    1. NSTEMI:  presented to [facility] with chest pain, Trop I peaking >2 with EKG noting TWI in lateral leads. Seen by Dr. Dulce Sellar and transferred to [facility] for further management with plans for cardiac cath. Underwent cardiac cath noted above with severe multivessel disease. TCTS consulted with plans for possible CABG on 11/8. No chest pain overnight.  -- remains on IV heparin, asa, statin and BB  2. HTN: reports being on lisinopril 20mg  daily along with spiro 25mg  daily prior to admission. Hold with elevated Cr -- tolerating the addition of metoprolol  3. HLD: continue high dose  statin (started at [place])  4. DM: metformin held -- SSI while inpatient  For questions or updates, please contact [facility] Please consult [email or web address] for contact info under     Signed, Laverda Page, NP  06/15/2020, 10:15 AM    Patient seen, examined. Available data reviewed. Agree with findings, assessment, and plan as outlined by Laverda Page, NP.  On exam, the patient is alert and oriented in no distress.  HEENT is pertinent for poor dentition but otherwise normal.  JVP is normal.  Lungs are clear to auscultation bilaterally.  Heart is regular rate and rhythm with no murmur gallop.  Abdomen is soft and nontender.  Extremities have no edema.  There are some chronic stasis changes in both lower legs.  The patient is clinically stable with no resting chest pain on intravenous heparin.  He is treated with aspirin, high intensity statin drug, and a beta-blocker.  Next available OR time is on Monday and he will remain hospitalized until he has CABG.  Tonny Bollman, M.D. 06/15/2020 4:37 PM

## 2020-06-15 NOTE — Progress Notes (Signed)
ANTICOAGULATION CONSULT NOTE  Pharmacy Consult for IV Heparin Indication: chest pain/ACS  No Known Allergies  Patient Measurements: Per pharmacist at [facility]: Weight: 123.5 kg (121.6 kg here); Height: 74 inches Heparin Dosing Weight: 108.5 kg  Vital Signs: Temp: 98.4 F (36.9 C) (11/04 0248) Temp Source: Oral (11/04 0248) BP: 130/82 (11/04 0248) Pulse Rate: 65 (11/04 0248)  Labs: Recent Labs    06/15/20 0253  HEPARINUNFRC 0.31  CREATININE 1.49*    Estimated Creatinine Clearance: 58.5 mL/min (A) (by C-G formula based on SCr of 1.49 mg/dL (H)).   Medical History: Past Medical History:  Diagnosis Date  . Diabetes (HCC)   . Hyperlipidemia   . Hypertension     Assessment: 74 yr old man transferred from [facility] to [facility] for cardiac cath today, which showed severe 3-vessel CAD. Cardiac surgery consult has been ordered for CABG; pharmacy was consulted to start heparin 8 hrs after sheath pull (per cath procedure log, sheath was removed at 1351 PM this afternoon).  Spoke with pharmacist at [facility], who informed me that pt was on therapeutic Lovenox there (125 mg SQ Q 12 hrs, with last dose charted there at 0220 AM today). [place] lab data: H/H 12.6/37.4, platelets 147, Scr 1.4, CrCl 62 ml/min.  Per pt, he was not taking any anticoagulants PTA to [facility].  11/4 AM update:  Heparin level therapeutic  Goal of Therapy:  Heparin level 0.3-0.7 units/ml Monitor platelets by anticoagulation protocol: Yes   Plan:  Cont heparin 1300 units/hr 1200 heparin level Possible CABG 11/8  Abran Duke, PharmD, BCPS Clinical Pharmacist Phone: [phone]

## 2020-06-15 NOTE — Progress Notes (Signed)
ANTICOAGULATION CONSULT NOTE  Pharmacy Consult for IV Heparin Indication: chest pain/ACS  No Known Allergies  Patient Measurements: Per pharmacist at [facility]: Weight: 123.5 kg (121.6 kg here); Height: 74 inches Heparin Dosing Weight: 108.5 kg  Vital Signs: Temp: 98.6 F (37 C) (11/04 2051) Temp Source: Oral (11/04 2051) BP: 118/63 (11/04 2221) Pulse Rate: 63 (11/04 2221)  Labs: Recent Labs    06/15/20 0253 06/15/20 1232 06/15/20 2137  HEPARINUNFRC 0.31 0.18* 0.20*  CREATININE 1.49*  --   --     Estimated Creatinine Clearance: 58.5 mL/min (A) (by C-G formula based on SCr of 1.49 mg/dL (H)).   Medical History: Past Medical History:  Diagnosis Date  . Diabetes (HCC)   . Hyperlipidemia   . Hypertension     Assessment: 74 yr old man transferred from [facility] to [facility] for cardiac cath today, which showed severe 3-vessel CAD. Cardiac surgery consult has been ordered for CABG; pharmacy was consulted to start heparin 8 hrs after sheath pull (per cath procedure log, sheath was removed at 1351 PM this afternoon).  Spoke with pharmacist at [facility], who informed me that pt was on therapeutic Lovenox there (125 mg SQ Q 12 hrs, with last dose charted there at 0220 AM today). [place] lab data: H/H 12.6/37.4, platelets 147, Scr 1.4, CrCl 62 ml/min.  Per pt, he was not taking any anticoagulants PTA to [facility].  Heparin level low this afternoon at 0.18, no bleeding issues noted.   11/4 PM update:  Heparin level still low No issues per RN  Goal of Therapy:  Heparin level 0.3-0.7 units/ml Monitor platelets by anticoagulation protocol: Yes   Plan:  Increase heparin to 1800 units/hr Re-check heparin level in 8 hours Planning CABG 11/8  Abran Duke, PharmD, BCPS Clinical Pharmacist Phone: [phone]

## 2020-06-15 NOTE — Progress Notes (Signed)
ANTICOAGULATION CONSULT NOTE  Pharmacy Consult for IV Heparin Indication: chest pain/ACS  No Known Allergies  Patient Measurements: Per pharmacist at [facility]: Weight: 123.5 kg (121.6 kg here); Height: 74 inches Heparin Dosing Weight: 108.5 kg  Vital Signs: Temp: 98.5 F (36.9 C) (11/04 1200) Temp Source: Oral (11/04 1200) BP: 109/80 (11/04 1200) Pulse Rate: 63 (11/04 1200)  Labs: Recent Labs    06/15/20 0253 06/15/20 1232  HEPARINUNFRC 0.31 0.18*  CREATININE 1.49*  --     Estimated Creatinine Clearance: 58.5 mL/min (A) (by C-G formula based on SCr of 1.49 mg/dL (H)).   Medical History: Past Medical History:  Diagnosis Date  . Diabetes (HCC)   . Hyperlipidemia   . Hypertension     Assessment: 74 yr old man transferred from [facility] to [facility] for cardiac cath today, which showed severe 3-vessel CAD. Cardiac surgery consult has been ordered for CABG; pharmacy was consulted to start heparin 8 hrs after sheath pull (per cath procedure log, sheath was removed at 1351 PM this afternoon).  Spoke with pharmacist at [facility], who informed me that pt was on therapeutic Lovenox there (125 mg SQ Q 12 hrs, with last dose charted there at 0220 AM today). [place] lab data: H/H 12.6/37.4, platelets 147, Scr 1.4, CrCl 62 ml/min.  Per pt, he was not taking any anticoagulants PTA to [facility].  Heparin level low this afternoon at 0.18, no bleeding issues noted.   Goal of Therapy:  Heparin level 0.3-0.7 units/ml Monitor platelets by anticoagulation protocol: Yes   Plan:  Increase heparin to 1600 units/hr Heparin level tonight Possible CABG 11/8  Sheppard Coil PharmD., BCPS Clinical Pharmacist 06/15/2020 3:01 PM

## 2020-06-15 NOTE — Progress Notes (Addendum)
Patient denies pain between scapula or chest pain. He is on a Heparin drip. First available OR is Monday 11/08 with Dr. Cliffton Asters, who will evaluate the patient today.  Agree with above. No events We will plan for surgery on Monday.  Larry May

## 2020-06-15 NOTE — Progress Notes (Signed)
Pre cabg has been completed.   Preliminary results in CV Proc.   Blanch Media 06/15/2020 1:56 PM

## 2020-06-15 NOTE — Progress Notes (Signed)
[id number] Gave pt OHS booklet, care guide and staying in the tube handout. Wrote down how to view pre op video. Discussed importance of walking and IS after surgery. Asked Nursing Secretary to order IS for pt. Discussed staying in the tube and sternal precautions and gave handout. Pt stated his son would be with him after discharge to assist in care. Offered walk but pt declined at this time. Just back from testing.  Will continue to follow. Luetta Nutting RN BSN 06/15/2020 2:55 PM

## 2020-06-15 NOTE — Progress Notes (Signed)
  Echocardiogram 2D Echocardiogram has been performed.  Larry May 06/15/2020, 2:44 PM

## 2020-06-16 ENCOUNTER — Encounter (HOSPITAL_COMMUNITY): Payer: Self-pay | Admitting: Cardiovascular Disease

## 2020-06-16 ENCOUNTER — Inpatient Hospital Stay (HOSPITAL_COMMUNITY): Payer: Medicare HMO

## 2020-06-16 DIAGNOSIS — I214 Non-ST elevation (NSTEMI) myocardial infarction: Secondary | ICD-10-CM | POA: Diagnosis not present

## 2020-06-16 LAB — CBC
HCT: 36.2 % — ABNORMAL LOW (ref 39.0–52.0)
Hemoglobin: 12.1 g/dL — ABNORMAL LOW (ref 13.0–17.0)
MCH: 28.9 pg (ref 26.0–34.0)
MCHC: 33.4 g/dL (ref 30.0–36.0)
MCV: 86.4 fL (ref 80.0–100.0)
Platelets: 140 10*3/uL — ABNORMAL LOW (ref 150–400)
RBC: 4.19 MIL/uL — ABNORMAL LOW (ref 4.22–5.81)
RDW: 12.9 % (ref 11.5–15.5)
WBC: 7 10*3/uL (ref 4.0–10.5)
nRBC: 0 % (ref 0.0–0.2)

## 2020-06-16 LAB — GLUCOSE, CAPILLARY
Glucose-Capillary: 158 mg/dL — ABNORMAL HIGH (ref 70–99)
Glucose-Capillary: 196 mg/dL — ABNORMAL HIGH (ref 70–99)
Glucose-Capillary: 179 mg/dL — ABNORMAL HIGH (ref 70–99)
Glucose-Capillary: 178 mg/dL — ABNORMAL HIGH (ref 70–99)

## 2020-06-16 LAB — HEPARIN LEVEL (UNFRACTIONATED): Heparin Unfractionated: 0.42 IU/mL (ref 0.30–0.70)

## 2020-06-16 MED ORDER — TRANEXAMIC ACID 1000 MG/10ML IV SOLN
1.5000 mg/kg/h | INTRAVENOUS | Status: DC
  Filled 2020-06-16: qty 25

## 2020-06-16 MED ORDER — MAGNESIUM SULFATE 50 % IJ SOLN
40.0000 meq | INTRAMUSCULAR | Status: DC
  Filled 2020-06-16: qty 9.85

## 2020-06-16 MED ORDER — TRANEXAMIC ACID (OHS) BOLUS VIA INFUSION
15.0000 mg/kg | INTRAVENOUS | Status: AC
  Administered 2020-06-19: 1824 mg via INTRAVENOUS
  Filled 2020-06-16: qty 1824

## 2020-06-16 MED ORDER — MANNITOL 20 % IV SOLN
Freq: Once | INTRAVENOUS | Status: DC
  Filled 2020-06-16: qty 13

## 2020-06-16 MED ORDER — SODIUM CHLORIDE 0.9 % IV SOLN
INTRAVENOUS | Status: DC
  Filled 2020-06-16: qty 30

## 2020-06-16 MED ORDER — EPINEPHRINE HCL 5 MG/250ML IV SOLN IN NS
0.0000 ug/min | INTRAVENOUS | Status: DC
  Filled 2020-06-16: qty 250

## 2020-06-16 MED ORDER — DEXMEDETOMIDINE HCL IN NACL 400 MCG/100ML IV SOLN
0.1000 ug/kg/h | INTRAVENOUS | Status: AC
  Administered 2020-06-19: .3 ug/kg/h via INTRAVENOUS
  Filled 2020-06-16: qty 100

## 2020-06-16 MED ORDER — NITROGLYCERIN IN D5W 200-5 MCG/ML-% IV SOLN
2.0000 ug/min | INTRAVENOUS | Status: DC
  Filled 2020-06-16: qty 250

## 2020-06-16 MED ORDER — PLASMA-LYTE 148 IV SOLN
INTRAVENOUS | Status: DC
  Filled 2020-06-16: qty 2.5

## 2020-06-16 MED ORDER — NOREPINEPHRINE 4 MG/250ML-% IV SOLN
0.0000 ug/min | INTRAVENOUS | Status: DC
  Filled 2020-06-16: qty 250

## 2020-06-16 MED ORDER — SODIUM CHLORIDE 0.9 % IV SOLN
750.0000 mg | INTRAVENOUS | Status: AC
  Administered 2020-06-19: 750 mg via INTRAVENOUS
  Filled 2020-06-16: qty 750

## 2020-06-16 MED ORDER — TRANEXAMIC ACID (OHS) PUMP PRIME SOLUTION
2.0000 mg/kg | INTRAVENOUS | Status: DC
  Filled 2020-06-16: qty 2.43

## 2020-06-16 MED ORDER — MILRINONE LACTATE IN DEXTROSE 20-5 MG/100ML-% IV SOLN
0.3000 ug/kg/min | INTRAVENOUS | Status: DC
  Filled 2020-06-16: qty 100

## 2020-06-16 MED ORDER — SODIUM CHLORIDE 0.9 % IV SOLN
1.5000 g | INTRAVENOUS | Status: AC
  Administered 2020-06-19: 1.5 g via INTRAVENOUS
  Filled 2020-06-16: qty 1.5

## 2020-06-16 MED ORDER — PHENYLEPHRINE HCL-NACL 20-0.9 MG/250ML-% IV SOLN
30.0000 ug/min | INTRAVENOUS | Status: AC
  Administered 2020-06-19: 20 ug/min via INTRAVENOUS
  Filled 2020-06-16: qty 250

## 2020-06-16 MED ORDER — POTASSIUM CHLORIDE 2 MEQ/ML IV SOLN
80.0000 meq | INTRAVENOUS | Status: DC
  Filled 2020-06-16: qty 40

## 2020-06-16 MED ORDER — VANCOMYCIN HCL 1500 MG/300ML IV SOLN
1500.0000 mg | INTRAVENOUS | Status: AC
  Administered 2020-06-19: 1500 mg via INTRAVENOUS
  Filled 2020-06-16: qty 300

## 2020-06-16 MED ORDER — INSULIN REGULAR(HUMAN) IN NACL 100-0.9 UT/100ML-% IV SOLN
INTRAVENOUS | Status: AC
  Administered 2020-06-19: 3 [IU]/h via INTRAVENOUS
  Administered 2020-06-19: 1 [IU]/h via INTRAVENOUS
  Filled 2020-06-16: qty 100

## 2020-06-16 NOTE — Progress Notes (Signed)
Patient denies pain between scapula or chest pain. On Heparin drip;platelets slightly decreased to 140,000. Monitor over the weekend. Echo showed no significant valvular disease (LVEF 55-60%, trivial MR, and mild TR). Duplex carotid US showed no significant internal carotid artery stenosis bilaterally. Will order PA/LAT CXR as part of pre op workup.

## 2020-06-16 NOTE — Progress Notes (Addendum)
Progress Note  Patient Name: Larry DessKenneth S May Date of Encounter: 06/16/2020  [facility] Cardiologist: Norman HerrlichBrian Munley, MD   Subjective   Pt has had no further back pain - his anginal equivalent.  Inpatient Medications    Scheduled Meds: . aspirin  81 mg Oral Daily  . atorvastatin  80 mg Oral Daily  . insulin aspart  0-15 Units Subcutaneous TID WC  . metoprolol tartrate  25 mg Oral BID  . sodium chloride flush  3 mL Intravenous Q12H   Continuous Infusions: . sodium chloride    . heparin 1,800 Units/hr (06/15/20 2352)   PRN Meds: sodium chloride, acetaminophen, ondansetron (ZOFRAN) IV, sodium chloride flush   Vital Signs    Vitals:   06/15/20 1639 06/15/20 2051 06/15/20 2221 06/16/20 0635  BP: 128/65 111/68 118/63   Pulse: 65 63 63   Resp:  18    Temp: 98.3 F (36.8 C) 98.6 F (37 C)  98 F (36.7 C)  TempSrc: Oral Oral  Oral  SpO2: 97% 96%    Weight:      Height:        Intake/Output Summary (Last 24 hours) at 06/16/2020 0848 Last data filed at 06/16/2020 0835 Gross per 24 hour  Intake 626.93 ml  Output 3700 ml  Net -3073.07 ml   Last 3 Weights 06/14/2020  Weight (lbs) 268 lb 1.3 oz  Weight (kg) 121.6 kg      Telemetry    Sinus rhythm in the 60s - Personally Reviewed  ECG    Sinus rhythm HR 66, first degree heart block, TWI lateral leads - Personally Reviewed  Physical Exam   GEN: No acute distress.   Neck: No JVD Cardiac: RRR, no murmurs, rubs, or gallops.  Respiratory: Clear to auscultation bilaterally. GI: Soft, nontender, non-distended  MS: No edema; No deformity. Neuro:  Nonfocal  Psych: Normal affect   Labs    High Sensitivity Troponin:  No results for input(s): TROPONINIHS in the last 720 hours.    Chemistry Recent Labs  Lab 06/15/20 0253  NA 137  K 4.5  CL 105  CO2 23  GLUCOSE 160*  BUN 18  CREATININE 1.49*  CALCIUM 8.6*  GFRNONAA 48*  ANIONGAP 9     Hematology Recent Labs  Lab 06/16/20 0209  WBC 7.0  RBC 4.19*   HGB 12.1*  HCT 36.2*  MCV 86.4  MCH 28.9  MCHC 33.4  RDW 12.9  PLT 140*    BNPNo results for input(s): BNP, PROBNP in the last 168 hours.   DDimer No results for input(s): DDIMER in the last 168 hours.   Radiology    CARDIAC CATHETERIZATION  Result Date: 06/14/2020  Mid RCA to Dist RCA lesion is 80% stenosed.  Dist RCA lesion is 40% stenosed.  RPDA lesion is 99% stenosed.  1st Mrg lesion is 50% stenosed.  2nd Mrg lesion is 99% stenosed.  Dist Cx lesion is 100% stenosed.  Ost Cx lesion is 80% stenosed.  Prox LAD lesion is 70% stenosed.  1st Diag-1 lesion is 70% stenosed.  1st Diag-2 lesion is 60% stenosed.  Dist LAD lesion is 40% stenosed.  Severe three vessel CAD Moderately severe, eccentric mid LAD stenosis involving the moderate to large caliber diagonal branch which also has severe ostial/proximal disease. The Circumflex is a very large caliber vessel (6mm). There is severe ostial stenosis. The first OM branch has severe disease. The distal AV groove Circumflex is occluded and fills from right to left collaterals.  The RCA is a large dominant vessel with severe mid stenosis in the bend of the vessel and severe PDA stenosis. Recommendations: He has severe three vessel CAD. The LAD lesion is moderately severe and involves the ostium of a moderate to large caliber diagonal branch. The Diagonal branch also has severe proximal disease. This would be high risk for PCI. The ostial Circumflex is a very large vessel (6 mm) with severe ostial stenosis and not favorable for PCI. The large dominant RCA has several segments of severe disease. -I think CABG is the best revascularization option. I will ask CT surgery to see him to discuss CABG. -Repeat echo here at [facility]. He had an echo at [place] but it was a poor study per report and the images are not available to review. -Continue ASA, statin and beta blocker. -Sliding scale insulin coverage. -Restart IV heparin 8 hours post sheath pull.    ECHOCARDIOGRAM COMPLETE  Result Date: 06/15/2020    ECHOCARDIOGRAM REPORT   Patient Name:   Larry May Date of Exam: 06/15/2020 Medical Rec #:  [id number]        Height:       74.0 in Accession #:    [id number]       Weight:       268.1 lb Date of Birth:  [birth year]        BSA:          2.462 m Patient Age:    74 years         BP:           109/80 mmHg Patient Gender: M                HR:           66 bpm. Exam Location:  Inpatient Procedure: 2D Echo Indications:    CAD Native Vessel I25.10  History:        Patient has no prior history of Echocardiogram examinations.                 Risk Factors:Hypertension, Diabetes and Dyslipidemia.  Sonographer:    Thurman Coyer RDCS (AE) Referring Phys: [id number] CHRISTOPHER D MCALHANY IMPRESSIONS  1. Left ventricular ejection fraction, by estimation, is 55 to 60%. The left ventricle has normal function. The left ventricle has no regional wall motion abnormalities. There is mild concentric left ventricular hypertrophy. Left ventricular diastolic parameters are consistent with Grade I diastolic dysfunction (impaired relaxation).  2. Right ventricular systolic function is normal. The right ventricular size is normal. There is normal pulmonary artery systolic pressure.  3. The mitral valve is normal in structure. Trivial mitral valve regurgitation. No evidence of mitral stenosis.  4. The aortic valve is normal in structure. Aortic valve regurgitation is not visualized. No aortic stenosis is present.  5. The inferior vena cava is normal in size with greater than 50% respiratory variability, suggesting right atrial pressure of 3 mmHg. FINDINGS  Left Ventricle: Left ventricular ejection fraction, by estimation, is 55 to 60%. The left ventricle has normal function. The left ventricle has no regional wall motion abnormalities. The left ventricular internal cavity size was normal in size. There is  mild concentric left ventricular hypertrophy. Left ventricular diastolic  parameters are consistent with Grade I diastolic dysfunction (impaired relaxation). Normal left ventricular filling pressure. Right Ventricle: The right ventricular size is normal. No increase in right ventricular wall thickness. Right ventricular systolic function is normal. There is normal pulmonary artery systolic pressure. Left Atrium:  Left atrial size was normal in size. Right Atrium: Right atrial size was normal in size. Pericardium: There is no evidence of pericardial effusion. Mitral Valve: The mitral valve is normal in structure. Trivial mitral valve regurgitation. No evidence of mitral valve stenosis. Tricuspid Valve: The tricuspid valve is normal in structure. Tricuspid valve regurgitation is mild . No evidence of tricuspid stenosis. Aortic Valve: The aortic valve is normal in structure. Aortic valve regurgitation is not visualized. No aortic stenosis is present. Pulmonic Valve: The pulmonic valve was normal in structure. Pulmonic valve regurgitation is not visualized. No evidence of pulmonic stenosis. Aorta: The aortic root is normal in size and structure. Venous: The inferior vena cava is normal in size with greater than 50% respiratory variability, suggesting right atrial pressure of 3 mmHg. IAS/Shunts: No atrial level shunt detected by color flow Doppler.  LEFT VENTRICLE PLAX 2D LVIDd:         5.06 cm  Diastology LVIDs:         3.70 cm  LV e' medial:    5.70 cm/s LV PW:         1.38 cm  LV E/e' medial:  11.9 LV IVS:        1.20 cm  LV e' lateral:   8.59 cm/s LVOT diam:     2.30 cm  LV E/e' lateral: 7.9 LV SV:         76 LV SV Index:   31 LVOT Area:     4.15 cm  RIGHT VENTRICLE RV S prime:     10.60 cm/s TAPSE (M-mode): 1.6 cm LEFT ATRIUM           Index       RIGHT ATRIUM           Index LA diam:      3.50 cm 1.42 cm/m  RA Area:     22.10 cm LA Vol (A2C): 37.3 ml 15.15 ml/m RA Volume:   66.80 ml  27.14 ml/m LA Vol (A4C): 51.7 ml 21.00 ml/m  AORTIC VALVE LVOT Vmax:   84.20 cm/s LVOT Vmean:   53.800 cm/s LVOT VTI:    0.184 m  AORTA Ao Root diam: 3.50 cm MITRAL VALVE MV Area (PHT): 2.50 cm    SHUNTS MV Decel Time: 303 msec    Systemic VTI:  0.18 m MV E velocity: 67.70 cm/s  Systemic Diam: 2.30 cm MV A velocity: 69.80 cm/s MV E/A ratio:  0.97 Tobias Alexander MD Electronically signed by Tobias Alexander MD Signature Date/Time: 06/15/2020/3:16:14 PM    Final    VAS US DOPPLER PRE CABG  Result Date: 06/15/2020 PREOPERATIVE VASCULAR EVALUATION  Indications:  Pre-CABG. Risk Factors: Hypertension, hyperlipidemia, Diabetes. Performing Technologist: Blanch Media RVS  Examination Guidelines: A complete evaluation includes B-mode imaging, spectral Doppler, color Doppler, and power Doppler as needed of all accessible portions of each vessel. Bilateral testing is considered an integral part of a complete examination. Limited examinations for reoccurring indications may be performed as noted.  Right Carotid Findings: +----------+--------+--------+--------+------------+--------+           PSV cm/sEDV cm/sStenosisDescribe    Comments +----------+--------+--------+--------+------------+--------+ CCA Prox  98      17              heterogenous         +----------+--------+--------+--------+------------+--------+ CCA Distal78      14              heterogenous         +----------+--------+--------+--------+------------+--------+  ICA Prox  52      19      1-39%   heterogenous         +----------+--------+--------+--------+------------+--------+ ICA Distal43      9                                    +----------+--------+--------+--------+------------+--------+ ECA       112                                          +----------+--------+--------+--------+------------+--------+ Portions of this table do not appear on this page. +----------+--------+-------+--------+------------+           PSV cm/sEDV cmsDescribeArm Pressure +----------+--------+-------+--------+------------+  Subclavian90                                  +----------+--------+-------+--------+------------+ +---------+--------+--------+--------------+ VertebralPSV cm/sEDV cm/sNot identified +---------+--------+--------+--------------+ Left Carotid Findings: +----------+--------+--------+--------+------------+--------+           PSV cm/sEDV cm/sStenosisDescribe    Comments +----------+--------+--------+--------+------------+--------+ CCA Prox  84      15              heterogenous         +----------+--------+--------+--------+------------+--------+ CCA Distal62      9               heterogenous         +----------+--------+--------+--------+------------+--------+ ICA Prox  53      17      1-39%   heterogenous         +----------+--------+--------+--------+------------+--------+ ICA Distal39      12                                   +----------+--------+--------+--------+------------+--------+ ECA       70                                           +----------+--------+--------+--------+------------+--------+ +----------+--------+--------+--------+------------+ SubclavianPSV cm/sEDV cm/sDescribeArm Pressure +----------+--------+--------+--------+------------+           96                                   +----------+--------+--------+--------+------------+ +---------+--------+--+--------+--+---------+ VertebralPSV cm/s31EDV cm/s12Antegrade +---------+--------+--+--------+--+---------+  ABI Findings: +--------+------------------+-----+---------+--------+ Right   Rt Pressure (mmHg)IndexWaveform Comment  +--------+------------------+-----+---------+--------+ Brachial                       triphasic         +--------+------------------+-----+---------+--------+ ATA                            triphasic         +--------+------------------+-----+---------+--------+ PTA                            triphasic          +--------+------------------+-----+---------+--------+ +--------+------------------+-----+---------+-------+ Left    Lt Pressure (mmHg)IndexWaveform Comment +--------+------------------+-----+---------+-------+ [id number]  triphasic        +--------+------------------+-----+---------+-------+ ATA                            triphasic        +--------+------------------+-----+---------+-------+ PTA                            triphasic        +--------+------------------+-----+---------+-------+  Right Doppler Findings: +--------+--------+-----+---------+--------+ Site    PressureIndexDoppler  Comments +--------+--------+-----+---------+--------+ Brachial             triphasic         +--------+--------+-----+---------+--------+ Radial               triphasic         +--------+--------+-----+---------+--------+ Ulnar                triphasic         +--------+--------+-----+---------+--------+  Left Doppler Findings: +--------+--------+-----+---------+--------+ Site    PressureIndexDoppler  Comments +--------+--------+-----+---------+--------+ [id number]          triphasic         +--------+--------+-----+---------+--------+ Radial               triphasic         +--------+--------+-----+---------+--------+ Ulnar                triphasic         +--------+--------+-----+---------+--------+  Summary: Right Carotid: Velocities in the right ICA are consistent with a 1-39% stenosis. Left Carotid: Velocities in the left ICA are consistent with a 1-39% stenosis. Vertebrals: Left vertebral artery demonstrates antegrade flow. Right vertebral             artery was not visualized. Right Upper Extremity: Doppler waveform obliterate with right radial compression. Doppler waveforms remain within normal limits with right ulnar compression. Left Upper Extremity: Doppler waveforms decrease >50% with left radial compression. Doppler waveforms  remain within normal limits with left ulnar compression.  Electronically signed by Heath Lark on 06/15/2020 at 7:07:35 PM.    Final     Cardiac Studies   Left heart cath 06/14/20:  Mid RCA to Dist RCA lesion is 80% stenosed.  Dist RCA lesion is 40% stenosed.  RPDA lesion is 99% stenosed.  1st Mrg lesion is 50% stenosed.  2nd Mrg lesion is 99% stenosed.  Dist Cx lesion is 100% stenosed.  Ost Cx lesion is 80% stenosed.  Prox LAD lesion is 70% stenosed.  1st Diag-1 lesion is 70% stenosed.  1st Diag-2 lesion is 60% stenosed.  Dist LAD lesion is 40% stenosed.   Severe three vessel CAD Moderately severe, eccentric mid LAD stenosis involving the moderate to large caliber diagonal branch which also has severe ostial/proximal disease.  The Circumflex is a very large caliber vessel (36mm). There is severe ostial stenosis. The first OM branch has severe disease. The distal AV groove Circumflex is occluded and fills from right to left collaterals.  The RCA is a large dominant vessel with severe mid stenosis in the bend of the vessel and severe PDA stenosis.   Recommendations: He has severe three vessel CAD. The LAD lesion is moderately severe and involves the ostium of a moderate to large caliber diagonal branch. The Diagonal branch also has severe proximal disease. This would be high risk for PCI. The ostial Circumflex is a very large vessel (6 mm) with severe ostial stenosis and not favorable for PCI.  The large dominant RCA has several segments of severe disease.  -I think CABG is the best revascularization option. I will ask CT surgery to see him to discuss CABG.  -Repeat echo here at [facility]. He had an echo at [place] but it was a poor study per report and the images are not available to review.  -Continue ASA, statin and beta blocker.  -Sliding scale insulin coverage.  -Restart IV heparin 8 hours post sheath pull.    Echo 06/15/20: 1. Left ventricular ejection fraction, by  estimation, is 55 to 60%. The  left ventricle has normal function. The left ventricle has no regional  wall motion abnormalities. There is mild concentric left ventricular  hypertrophy. Left ventricular diastolic  parameters are consistent with Grade I diastolic dysfunction (impaired  relaxation).  2. Right ventricular systolic function is normal. The right ventricular  size is normal. There is normal pulmonary artery systolic pressure.  3. The mitral valve is normal in structure. Trivial mitral valve  regurgitation. No evidence of mitral stenosis.  4. The aortic valve is normal in structure. Aortic valve regurgitation is  not visualized. No aortic stenosis is present.  5. The inferior vena cava is normal in size with greater than 50%  respiratory variability, suggesting right atrial pressure of 3 mmHg.   Patient Profile     74 y.o. male with a PMH significant for HTN, DM, and HLD who presented to [facility] with CP found to have NSTEMI. He was transferred to [facility] for heart cath which showed severe multivessel disease. CT surgery consulted and he is awaiting CABG.  Pt's anginal equivalent seems to be back pain with occasional left arm pain. He had been having this since January, worse when he walks uphill from [employer]. [place] is very active on a horse [place].    Assessment & Plan    NSTEMI Severe multivessel disease - trop > 2, EKG with TWI lateral leads - heart cath with severe multivessel disease - CT surgery was consulted, possible CABG on 11/8 - continue heparin gtt, ASA, statin, and BB - no further back pain since admission   Hypertension - PTA - was on 20 mg lisinopril and spiro-HCTZ 25-25 - holding both ACEI and spiro for renal function - have added lopressor - pressures have been 110-120s systolic   Hyperlipidemia with LDL goal < 70 - continue 80 mg lipitor (started this admission) - I will Larry fasting lipid profile to labs tomorrow morning   DM2 - A1c  6.7% - SSI    CKD stage  - sCr 1.49 - BMP tomorrow   For questions or updates, please contact [facility] Please consult [email or web address] for contact info under     Signed, Marcelino Duster, PA  06/16/2020, 8:48 AM    Patient seen, examined. Available data reviewed. Agree with findings, assessment, and plan as outlined by Bettina Gavia, PA-C.  The patient is doing well this morning, but he is anxious to have his surgery done.  He understands the scheduling issues and the high volume of coronary bypass surgeries right now have led to next availability on Monday.  He has no chest pain or shortness of breath at rest.  He continues on IV heparin.  On my exam he is alert, oriented, in no distress.  Lungs are clear, heart is regular rate rhythm no murmur gallop, abdomen soft nontender, extremities with chronic stasis changes in the lower legs bilaterally but only trace edema.  Plans for multivessel CABG on  Monday.  Otherwise as outlined above.  Tonny Bollman, M.D. 06/16/2020 11:30 AM

## 2020-06-16 NOTE — Progress Notes (Signed)
CARDIAC REHAB PHASE I   PRE:  Rate/Rhythm: 67 SR  BP:  Supine: 116/50  Sitting:   Standing:    SaO2: 98%RA  MODE:  Ambulation: 600 ft   POST:  Rate/Rhythm: 83 SR  BP:  Supine:   Sitting: 118/53  Standing:    SaO2: 97%RA 1330-1415 Pt walked 600 ft on RA independently while I managed IV pole and monitor on wheels. Pt stated felt good to be up. Back to bed after walk. Stated lower back bothering him some but he thought was from not being up. Gave IS and he demonstrated 2500 ml correctly.  He stood without use of arms practicing sternal precautions. Tolerated well.   Luetta Nutting, RN BSN  06/16/2020 2:10 PM

## 2020-06-16 NOTE — Progress Notes (Signed)
ANTICOAGULATION CONSULT NOTE  Pharmacy Consult for IV Heparin Indication: chest pain/ACS  No Known Allergies  Patient Measurements: Per pharmacist at [facility]: Weight: 123.5 kg (121.6 kg here); Height: 74 inches Heparin Dosing Weight: 108.5 kg  Vital Signs: Temp: 98 F (36.7 C) (11/05 0635) Temp Source: Oral (11/05 0635) BP: 118/63 (11/04 2221) Pulse Rate: 63 (11/04 2221)  Labs: Recent Labs    06/15/20 0253 06/15/20 0253 06/15/20 1232 06/15/20 2137 06/16/20 0209 06/16/20 0815  HGB  --   --   --   --  12.1*  --   HCT  --   --   --   --  36.2*  --   PLT  --   --   --   --  140*  --   HEPARINUNFRC 0.31   < > 0.18* 0.20*  --  0.42  CREATININE 1.49*  --   --   --   --   --    < > = values in this interval not displayed.    Estimated Creatinine Clearance: 58.5 mL/min (A) (by C-G formula based on SCr of 1.49 mg/dL (H)).   Medical History: Past Medical History:  Diagnosis Date  . Diabetes (HCC)   . Hyperlipidemia   . Hypertension     Assessment: 74 yr old man transferred from [facility] to [facility] for cardiac cath today, which showed severe 3-vessel CAD. Cardiac surgery consult has been ordered for CABG; pharmacy was consulted to start heparin 8 hrs after sheath pull (per cath procedure log, sheath was removed at 1351 PM this afternoon).  Spoke with pharmacist at [facility], who informed me that pt was on therapeutic Lovenox there (125 mg SQ Q 12 hrs, with last dose charted there at 0220 AM today). [place] lab data: H/H 12.6/37.4, platelets 147, Scr 1.4, CrCl 62 ml/min.  Per pt, he was not taking any anticoagulants PTA to [facility].  11/4: Heparin level low this afternoon at 0.18, no bleeding issues noted.   11/5: Heparin level this morning is therapeutic x1 at 0.42, no bleeding issues noted. Platelets slightly low at 140,000, will continue to monitor.   Goal of Therapy:  Heparin level 0.3-0.7 units/ml Monitor platelets by  anticoagulation protocol: Yes   Plan:  Continue heparin 1600 units/hr Check confirmatory heparin level in 8 hours Daily CBC while on heparin Possible CABG 11/8  Levada Schilling PharmD Candidate 2022 06/16/2020 10:10 AM

## 2020-06-17 LAB — LIPID PANEL
Cholesterol: 120 mg/dL (ref 0–200)
HDL: 35 mg/dL — ABNORMAL LOW (ref >40)
LDL Cholesterol: 66 mg/dL (ref 0–99)
Total CHOL/HDL Ratio: 3.4 RATIO
Triglycerides: 96 mg/dL (ref <150)
VLDL: 19 mg/dL (ref 0–40)

## 2020-06-17 LAB — CBC
HCT: 36.7 % — ABNORMAL LOW (ref 39.0–52.0)
Hemoglobin: 12.5 g/dL — ABNORMAL LOW (ref 13.0–17.0)
MCH: 29.4 pg (ref 26.0–34.0)
MCHC: 34.1 g/dL (ref 30.0–36.0)
MCV: 86.4 fL (ref 80.0–100.0)
Platelets: 128 10*3/uL — ABNORMAL LOW (ref 150–400)
RBC: 4.25 MIL/uL (ref 4.22–5.81)
RDW: 13 % (ref 11.5–15.5)
WBC: 7.3 10*3/uL (ref 4.0–10.5)
nRBC: 0 % (ref 0.0–0.2)

## 2020-06-17 LAB — BASIC METABOLIC PANEL
Anion gap: 11 (ref 5–15)
BUN: 20 mg/dL (ref 8–23)
CO2: 23 mmol/L (ref 22–32)
Calcium: 8.8 mg/dL — ABNORMAL LOW (ref 8.9–10.3)
Chloride: 104 mmol/L (ref 98–111)
Creatinine, Ser: 1.67 mg/dL — ABNORMAL HIGH (ref 0.61–1.24)
GFR, Estimated: 42 mL/min — ABNORMAL LOW (ref >60)
Glucose, Bld: 159 mg/dL — ABNORMAL HIGH (ref 70–99)
Potassium: 4.3 mmol/L (ref 3.5–5.1)
Sodium: 138 mmol/L (ref 135–145)

## 2020-06-17 LAB — HEPARIN LEVEL (UNFRACTIONATED)
Heparin Unfractionated: 0.23 IU/mL — ABNORMAL LOW (ref 0.30–0.70)
Heparin Unfractionated: 0.3 IU/mL (ref 0.30–0.70)

## 2020-06-17 LAB — GLUCOSE, CAPILLARY
Glucose-Capillary: 150 mg/dL — ABNORMAL HIGH (ref 70–99)
Glucose-Capillary: 178 mg/dL — ABNORMAL HIGH (ref 70–99)
Glucose-Capillary: 186 mg/dL — ABNORMAL HIGH (ref 70–99)

## 2020-06-17 NOTE — Progress Notes (Signed)
ANTICOAGULATION CONSULT NOTE Pharmacy Consult for Heparin Indication: chest pain/ACS  No Known Allergies  Patient Measurements: Per pharmacist at [facility]: Weight: 123.5 kg (121.6 kg here); Height: 74 inches Heparin Dosing Weight: 108.5 kg  Vital Signs: Temp: 98.2 F (36.8 C) (11/05 1946) Temp Source: Oral (11/05 1946) BP: 117/58 (11/05 2057) Pulse Rate: 66 (11/05 2057)  Labs: Recent Labs    06/15/20 0253 06/15/20 1232 06/15/20 2137 06/16/20 0209 06/16/20 0815 06/17/20 0302  HGB  --   --   --  12.1*  --  12.5*  HCT  --   --   --  36.2*  --  36.7*  PLT  --   --   --  140*  --  128*  HEPARINUNFRC 0.31   < > 0.20*  --  0.42 0.23*  CREATININE 1.49*  --   --   --   --  1.67*   < > = values in this interval not displayed.    Estimated Creatinine Clearance: 52.2 mL/min (A) (by C-G formula based on SCr of 1.67 mg/dL (H)).  Assessment: 74 y.o. male with CAD s/p cath and awaiting possible CABG for heparin  Goal of Therapy:  Heparin level 0.3-0.7 units/ml Monitor platelets by anticoagulation protocol: Yes   Plan:  Increase Heparin  2000 units/hr  Eddie Candle 06/17/2020 4:26 AM

## 2020-06-17 NOTE — Progress Notes (Signed)
ANTICOAGULATION CONSULT NOTE Pharmacy Consult for Heparin Indication: chest pain/ACS  No Known Allergies  Patient Measurements: Per pharmacist at [facility]: Weight: 123.5 kg (121.6 kg here); Height: 74 inches Heparin Dosing Weight: 108.5 kg  Vital Signs: Temp: 98.1 F (36.7 C) (11/06 1123) Temp Source: Oral (11/06 1123) BP: 113/56 (11/06 1123) Pulse Rate: 64 (11/06 1123)  Labs: Recent Labs    06/15/20 0253 06/15/20 1232 06/15/20 2137 06/16/20 0209 06/16/20 0815 06/17/20 0302 06/17/20 1429  HGB  --   --   --  12.1*  --  12.5*  --   HCT  --   --   --  36.2*  --  36.7*  --   PLT  --   --   --  140*  --  128*  --   HEPARINUNFRC 0.31   < >   < >  --  0.42 0.23* 0.30  CREATININE 1.49*  --   --   --   --  1.67*  --    < > = values in this interval not displayed.    Estimated Creatinine Clearance: 52.2 mL/min (A) (by C-G formula based on SCr of 1.67 mg/dL (H)).  Assessment: 74 y.o. male with CAD s/p cath and awaiting CABG for heparin.  Heparin level this afternoon is at goal.  No overt bleeding or complications noted.  Goal of Therapy:  Heparin level 0.3-0.7 units/ml Monitor platelets by anticoagulation protocol: Yes   Plan:  Continue IV heparin at current rate. Daily heparin level and CBC.  Reece Leader, Colon Flattery, BCCP Clinical Pharmacist  06/17/2020 3:52 PM   [facility] pharmacy phone numbers are listed on [email or web address]

## 2020-06-17 NOTE — Progress Notes (Signed)
Progress Note  Patient Name: Danielle DessKenneth S Sangha Date of Encounter: 06/17/2020  [facility] Cardiologist: Norman HerrlichBrian Munley, MD   Subjective   No chest pains, no SOB  Inpatient Medications    Scheduled Meds: . aspirin  81 mg Oral Daily  . atorvastatin  80 mg Oral Daily  . [START ON 06/19/2020] epinephrine  0-10 mcg/min Intravenous To OR  . [START ON 06/19/2020] heparin-papaverine-plasmalyte irrigation   Irrigation To OR  . insulin aspart  0-15 Units Subcutaneous TID WC  . [START ON 06/19/2020] insulin   Intravenous To OR  . [START ON 06/19/2020] [place] Blood Cardioplegia vial (lidocaine/magnesium/mannitol 0.26g-4g-6.4g)   Intracoronary Once  . [START ON 06/19/2020] magnesium sulfate  40 mEq Other To OR  . metoprolol tartrate  25 mg Oral BID  . [START ON 06/19/2020] phenylephrine  30-200 mcg/min Intravenous To OR  . [START ON 06/19/2020] potassium chloride  80 mEq Other To OR  . sodium chloride flush  3 mL Intravenous Q12H  . [START ON 06/19/2020] tranexamic acid  15 mg/kg Intravenous To OR  . [START ON 06/19/2020] tranexamic acid  2 mg/kg Intracatheter To OR   Continuous Infusions: . sodium chloride    . [START ON 06/19/2020] cefUROXime (ZINACEF)  IV    . [START ON 06/19/2020] cefUROXime (ZINACEF)  IV    . [START ON 06/19/2020] dexmedetomidine    . [START ON 06/19/2020] heparin 30,000 units/NS 1000 mL solution for CELLSAVER    . heparin 2,000 Units/hr (06/17/20 0443)  . [START ON 06/19/2020] milrinone    . [START ON 06/19/2020] nitroGLYCERIN    . [START ON 06/19/2020] norepinephrine    . [START ON 06/19/2020] tranexamic acid (CYKLOKAPRON) infusion ([facility])    . [START ON 06/19/2020] vancomycin     PRN Meds: sodium chloride, acetaminophen, ondansetron (ZOFRAN) IV, sodium chloride flush   Vital Signs    Vitals:   06/16/20 1946 06/16/20 2057 06/17/20 0443 06/17/20 0901  BP: (!) 115/56 (!) 117/58 (!) 101/57 129/68  Pulse: 69 66 (!) 56 67  Resp: 18 14 19    Temp: 98.2 F (36.8 C)  98 F  (36.7 C)   TempSrc: Oral  Oral   SpO2: 95% 96% 98%   Weight:      Height:        Intake/Output Summary (Last 24 hours) at 06/17/2020 0959 Last data filed at 06/17/2020 0555 Gross per 24 hour  Intake 370.64 ml  Output 1400 ml  Net -1029.36 ml   Last 3 Weights 06/14/2020  Weight (lbs) 268 lb 1.3 oz  Weight (kg) 121.6 kg      Telemetry    NSR - Personally Reviewed  ECG    n/a - Personally Reviewed  Physical Exam   GEN: No acute distress.   Neck: No JVD Cardiac: RRR, no murmurs, rubs, or gallops.  Respiratory: Clear to auscultation bilaterally. GI: Soft, nontender, non-distended  MS: No edema; No deformity. Neuro:  Nonfocal  Psych: Normal affect   Labs    High Sensitivity Troponin:  No results for input(s): TROPONINIHS in the last 720 hours.    Chemistry Recent Labs  Lab 06/15/20 0253 06/17/20 0302  NA 137 138  K 4.5 4.3  CL 105 104  CO2 23 23  GLUCOSE 160* 159*  BUN 18 20  CREATININE 1.49* 1.67*  CALCIUM 8.6* 8.8*  GFRNONAA 48* 42*  ANIONGAP 9 11     Hematology Recent Labs  Lab 06/16/20 0209 06/17/20 0302  WBC 7.0 7.3  RBC 4.19*  4.25  HGB 12.1* 12.5*  HCT 36.2* 36.7*  MCV 86.4 86.4  MCH 28.9 29.4  MCHC 33.4 34.1  RDW 12.9 13.0  PLT 140* 128*    BNPNo results for input(s): BNP, PROBNP in the last 168 hours.   DDimer No results for input(s): DDIMER in the last 168 hours.   Radiology    DG Chest 2 View  Result Date: 06/16/2020 CLINICAL DATA:  Preoperative for bypass surgery EXAM: CHEST - 2 VIEW COMPARISON:  None. FINDINGS: Normal heart size. Atherosclerotic mildly tortuous thoracic aorta. Otherwise normal mediastinal contour. No pneumothorax. No pleural effusion. Lungs appear clear, with no acute consolidative airspace disease and no pulmonary edema. IMPRESSION: No active cardiopulmonary disease. Electronically Signed   By: Delbert Phenix M.D.   On: 06/16/2020 10:29   ECHOCARDIOGRAM COMPLETE  Result Date: 06/15/2020    ECHOCARDIOGRAM REPORT    Patient Name:   VENTURA HOLLENBECK Date of Exam: 06/15/2020 Medical Rec #:  [id number]        Height:       74.0 in Accession #:    [id number]       Weight:       268.1 lb Date of Birth:  02/21/1946        BSA:          2.462 m Patient Age:    74 years         BP:           109/80 mmHg Patient Gender: M                HR:           66 bpm. Exam Location:  Inpatient Procedure: 2D Echo Indications:    CAD Native Vessel I25.10  History:        Patient has no prior history of Echocardiogram examinations.                 Risk Factors:Hypertension, Diabetes and Dyslipidemia.  Sonographer:    Thurman Coyer RDCS (AE) Referring Phys: [id number] CHRISTOPHER D MCALHANY IMPRESSIONS  1. Left ventricular ejection fraction, by estimation, is 55 to 60%. The left ventricle has normal function. The left ventricle has no regional wall motion abnormalities. There is mild concentric left ventricular hypertrophy. Left ventricular diastolic parameters are consistent with Grade I diastolic dysfunction (impaired relaxation).  2. Right ventricular systolic function is normal. The right ventricular size is normal. There is normal pulmonary artery systolic pressure.  3. The mitral valve is normal in structure. Trivial mitral valve regurgitation. No evidence of mitral stenosis.  4. The aortic valve is normal in structure. Aortic valve regurgitation is not visualized. No aortic stenosis is present.  5. The inferior vena cava is normal in size with greater than 50% respiratory variability, suggesting right atrial pressure of 3 mmHg. FINDINGS  Left Ventricle: Left ventricular ejection fraction, by estimation, is 55 to 60%. The left ventricle has normal function. The left ventricle has no regional wall motion abnormalities. The left ventricular internal cavity size was normal in size. There is  mild concentric left ventricular hypertrophy. Left ventricular diastolic parameters are consistent with Grade I diastolic dysfunction (impaired relaxation).  Normal left ventricular filling pressure. Right Ventricle: The right ventricular size is normal. No increase in right ventricular wall thickness. Right ventricular systolic function is normal. There is normal pulmonary artery systolic pressure. Left Atrium: Left atrial size was normal in size. Right Atrium: Right atrial size was normal in size. Pericardium: There is  no evidence of pericardial effusion. Mitral Valve: The mitral valve is normal in structure. Trivial mitral valve regurgitation. No evidence of mitral valve stenosis. Tricuspid Valve: The tricuspid valve is normal in structure. Tricuspid valve regurgitation is mild . No evidence of tricuspid stenosis. Aortic Valve: The aortic valve is normal in structure. Aortic valve regurgitation is not visualized. No aortic stenosis is present. Pulmonic Valve: The pulmonic valve was normal in structure. Pulmonic valve regurgitation is not visualized. No evidence of pulmonic stenosis. Aorta: The aortic root is normal in size and structure. Venous: The inferior vena cava is normal in size with greater than 50% respiratory variability, suggesting right atrial pressure of 3 mmHg. IAS/Shunts: No atrial level shunt detected by color flow Doppler.  LEFT VENTRICLE PLAX 2D LVIDd:         5.06 cm  Diastology LVIDs:         3.70 cm  LV e' medial:    5.70 cm/s LV PW:         1.38 cm  LV E/e' medial:  11.9 LV IVS:        1.20 cm  LV e' lateral:   8.59 cm/s LVOT diam:     2.30 cm  LV E/e' lateral: 7.9 LV SV:         76 LV SV Index:   31 LVOT Area:     4.15 cm  RIGHT VENTRICLE RV S prime:     10.60 cm/s TAPSE (M-mode): 1.6 cm LEFT ATRIUM           Index       RIGHT ATRIUM           Index LA diam:      3.50 cm 1.42 cm/m  RA Area:     22.10 cm LA Vol (A2C): 37.3 ml 15.15 ml/m RA Volume:   66.80 ml  27.14 ml/m LA Vol (A4C): 51.7 ml 21.00 ml/m  AORTIC VALVE LVOT Vmax:   84.20 cm/s LVOT Vmean:  53.800 cm/s LVOT VTI:    0.184 m  AORTA Ao Root diam: 3.50 cm MITRAL VALVE MV Area (PHT):  2.50 cm    SHUNTS MV Decel Time: 303 msec    Systemic VTI:  0.18 m MV E velocity: 67.70 cm/s  Systemic Diam: 2.30 cm MV A velocity: 69.80 cm/s MV E/A ratio:  0.97 Tobias Alexander MD Electronically signed by Tobias Alexander MD Signature Date/Time: 06/15/2020/3:16:14 PM    Final    VAS US DOPPLER PRE CABG  Result Date: 06/15/2020 PREOPERATIVE VASCULAR EVALUATION  Indications:  Pre-CABG. Risk Factors: Hypertension, hyperlipidemia, Diabetes. Performing Technologist: Blanch Media RVS  Examination Guidelines: A complete evaluation includes B-mode imaging, spectral Doppler, color Doppler, and power Doppler as needed of all accessible portions of each vessel. Bilateral testing is considered an integral part of a complete examination. Limited examinations for reoccurring indications may be performed as noted.  Right Carotid Findings: +----------+--------+--------+--------+------------+--------+           PSV cm/sEDV cm/sStenosisDescribe    Comments +----------+--------+--------+--------+------------+--------+ CCA Prox  98      17              heterogenous         +----------+--------+--------+--------+------------+--------+ CCA Distal78      14              heterogenous         +----------+--------+--------+--------+------------+--------+ ICA Prox  52      19      1-39%  heterogenous         +----------+--------+--------+--------+------------+--------+ ICA Distal43      9                                    +----------+--------+--------+--------+------------+--------+ ECA       112                                          +----------+--------+--------+--------+------------+--------+ Portions of this table do not appear on this page. +----------+--------+-------+--------+------------+           PSV cm/sEDV cmsDescribeArm Pressure +----------+--------+-------+--------+------------+ Subclavian90                                   +----------+--------+-------+--------+------------+ +---------+--------+--------+--------------+ VertebralPSV cm/sEDV cm/sNot identified +---------+--------+--------+--------------+ Left Carotid Findings: +----------+--------+--------+--------+------------+--------+           PSV cm/sEDV cm/sStenosisDescribe    Comments +----------+--------+--------+--------+------------+--------+ CCA Prox  84      15              heterogenous         +----------+--------+--------+--------+------------+--------+ CCA Distal62      9               heterogenous         +----------+--------+--------+--------+------------+--------+ ICA Prox  53      17      1-39%   heterogenous         +----------+--------+--------+--------+------------+--------+ ICA Distal39      12                                   +----------+--------+--------+--------+------------+--------+ ECA       70                                           +----------+--------+--------+--------+------------+--------+ +----------+--------+--------+--------+------------+ SubclavianPSV cm/sEDV cm/sDescribeArm Pressure +----------+--------+--------+--------+------------+           96                                   +----------+--------+--------+--------+------------+ +---------+--------+--+--------+--+---------+ VertebralPSV cm/s31EDV cm/s12Antegrade +---------+--------+--+--------+--+---------+  ABI Findings: +--------+------------------+-----+---------+--------+ Right   Rt Pressure (mmHg)IndexWaveform Comment  +--------+------------------+-----+---------+--------+ Brachial                       triphasic         +--------+------------------+-----+---------+--------+ ATA                            triphasic         +--------+------------------+-----+---------+--------+ PTA                            triphasic         +--------+------------------+-----+---------+--------+  +--------+------------------+-----+---------+-------+ Left    Lt Pressure (mmHg)IndexWaveform Comment +--------+------------------+-----+---------+-------+ [id number]                    triphasic        +--------+------------------+-----+---------+-------+  ATA                            triphasic        +--------+------------------+-----+---------+-------+ PTA                            triphasic        +--------+------------------+-----+---------+-------+  Right Doppler Findings: +--------+--------+-----+---------+--------+ Site    PressureIndexDoppler  Comments +--------+--------+-----+---------+--------+ Brachial             triphasic         +--------+--------+-----+---------+--------+ Radial               triphasic         +--------+--------+-----+---------+--------+ Ulnar                triphasic         +--------+--------+-----+---------+--------+  Left Doppler Findings: +--------+--------+-----+---------+--------+ Site    PressureIndexDoppler  Comments +--------+--------+-----+---------+--------+ [id number]          triphasic         +--------+--------+-----+---------+--------+ Radial               triphasic         +--------+--------+-----+---------+--------+ Ulnar                triphasic         +--------+--------+-----+---------+--------+  Summary: Right Carotid: Velocities in the right ICA are consistent with a 1-39% stenosis. Left Carotid: Velocities in the left ICA are consistent with a 1-39% stenosis. Vertebrals: Left vertebral artery demonstrates antegrade flow. Right vertebral             artery was not visualized. Right Upper Extremity: Doppler waveform obliterate with right radial compression. Doppler waveforms remain within normal limits with right ulnar compression. Left Upper Extremity: Doppler waveforms decrease >50% with left radial compression. Doppler waveforms remain within normal limits with left ulnar compression.   Electronically signed by Heath Lark on 06/15/2020 at 7:07:35 PM.    Final     Cardiac Studies   06/14/20 cath  Mid RCA to Dist RCA lesion is 80% stenosed.  Dist RCA lesion is 40% stenosed.  RPDA lesion is 99% stenosed.  1st Mrg lesion is 50% stenosed.  2nd Mrg lesion is 99% stenosed.  Dist Cx lesion is 100% stenosed.  Ost Cx lesion is 80% stenosed.  Prox LAD lesion is 70% stenosed.  1st Diag-1 lesion is 70% stenosed.  1st Diag-2 lesion is 60% stenosed.  Dist LAD lesion is 40% stenosed.      Patient Profile        74 y.o. male with a PMH significant for HTN, DM, and HLD who presented to [facility] with CP found to have NSTEMI. He was transferred to [facility] for heart cath which showed severe multivessel disease. CT surgery consulted and he is awaiting CABG.  Pt's anginal equivalent seems to be back pain with occasional left arm pain. He had been having this since January, worse when he walks uphill from [employer]. He is very active on a horse farm.    Assessment & Plan    1. NSTEMI - cath this admit with severe multivessel disease as reported above - 06/15/20 echo: LVEF 55-60%, no WMAs, grade I DDx, normal RV function - medical therapy with ASA 81, atorva 80, hep gtt, lopressor 25mg  bid. No ACE/ARB due to renal function  - no current symptoms - plans  for CABG Monday   2. HTN PTA - was on 20 mg lisinopril and spiro-HCTZ 25-25 - holding both ACEI and spiro for renal function - have added lopressor  3. Hyperlipidemia - LDL 66 this admit, continue statin  For questions or updates, please contact [facility] Please consult [email or web address] for contact info under        Signed, Dina Rich, MD  06/17/2020, 9:59 AM

## 2020-06-18 ENCOUNTER — Inpatient Hospital Stay (HOSPITAL_COMMUNITY): Payer: Medicare HMO

## 2020-06-18 ENCOUNTER — Other Ambulatory Visit: Payer: Self-pay

## 2020-06-18 ENCOUNTER — Encounter (HOSPITAL_COMMUNITY): Payer: Self-pay | Admitting: Cardiovascular Disease

## 2020-06-18 LAB — BLOOD GAS, ARTERIAL
Acid-Base Excess: 0.2 mmol/L (ref 0.0–2.0)
Bicarbonate: 23.8 mmol/L (ref 20.0–28.0)
FIO2: 98
O2 Saturation: 95.9 %
Patient temperature: 37.5
pCO2 arterial: 36.3 mmHg (ref 32.0–48.0)
pH, Arterial: 7.435 (ref 7.350–7.450)
pO2, Arterial: 77.9 mmHg — ABNORMAL LOW (ref 83.0–108.0)

## 2020-06-18 LAB — HEPARIN LEVEL (UNFRACTIONATED)
Heparin Unfractionated: 0.28 IU/mL — ABNORMAL LOW (ref 0.30–0.70)
Heparin Unfractionated: 0.25 IU/mL — ABNORMAL LOW (ref 0.30–0.70)

## 2020-06-18 LAB — CBC
HCT: 37.8 % — ABNORMAL LOW (ref 39.0–52.0)
Hemoglobin: 12.4 g/dL — ABNORMAL LOW (ref 13.0–17.0)
MCH: 28.6 pg (ref 26.0–34.0)
MCHC: 32.8 g/dL (ref 30.0–36.0)
MCV: 87.1 fL (ref 80.0–100.0)
Platelets: 151 10*3/uL (ref 150–400)
RBC: 4.34 MIL/uL (ref 4.22–5.81)
RDW: 13.2 % (ref 11.5–15.5)
WBC: 8 10*3/uL (ref 4.0–10.5)
nRBC: 0 % (ref 0.0–0.2)

## 2020-06-18 LAB — GLUCOSE, CAPILLARY
Glucose-Capillary: 182 mg/dL — ABNORMAL HIGH (ref 70–99)
Glucose-Capillary: 154 mg/dL — ABNORMAL HIGH (ref 70–99)
Glucose-Capillary: 181 mg/dL — ABNORMAL HIGH (ref 70–99)
Glucose-Capillary: 149 mg/dL — ABNORMAL HIGH (ref 70–99)

## 2020-06-18 LAB — RESP PANEL BY RT PCR (RSV, FLU A&B, COVID)
Influenza A by PCR: NEGATIVE
Influenza B by PCR: NEGATIVE
Respiratory Syncytial Virus by PCR: NEGATIVE
SARS Coronavirus 2 by RT PCR: NEGATIVE

## 2020-06-18 LAB — SURGICAL PCR SCREEN
MRSA, PCR: NEGATIVE
Staphylococcus aureus: NEGATIVE

## 2020-06-18 LAB — PROTIME-INR
INR: 1.1 (ref 0.8–1.2)
Prothrombin Time: 13.8 seconds (ref 11.4–15.2)

## 2020-06-18 MED ORDER — CHLORHEXIDINE GLUCONATE CLOTH 2 % EX PADS: 6.0000 | MEDICATED_PAD | Freq: Once | CUTANEOUS | Status: DC

## 2020-06-18 MED ORDER — MUPIROCIN 2 % EX OINT: 1.0000 "application " | TOPICAL_OINTMENT | Freq: Two times a day (BID) | CUTANEOUS | Status: DC

## 2020-06-18 MED ORDER — CHLORHEXIDINE GLUCONATE CLOTH 2 % EX PADS
6.0000 | MEDICATED_PAD | Freq: Once | CUTANEOUS | Status: AC
  Administered 2020-06-18: 6 via TOPICAL

## 2020-06-18 MED ORDER — TEMAZEPAM 15 MG PO CAPS
15.0000 mg | ORAL_CAPSULE | Freq: Once | ORAL | Status: AC | PRN
  Administered 2020-06-18: 15 mg via ORAL
  Filled 2020-06-18: qty 1

## 2020-06-18 MED ORDER — BISACODYL 5 MG PO TBEC
5.0000 mg | DELAYED_RELEASE_TABLET | Freq: Once | ORAL | Status: AC
  Administered 2020-06-18: 5 mg via ORAL
  Filled 2020-06-18: qty 1

## 2020-06-18 MED ORDER — METOPROLOL TARTRATE 12.5 MG HALF TABLET
12.5000 mg | ORAL_TABLET | Freq: Once | ORAL | Status: AC
  Administered 2020-06-19: 12.5 mg via ORAL
  Filled 2020-06-18: qty 1

## 2020-06-18 MED ORDER — CHLORHEXIDINE GLUCONATE 0.12 % MT SOLN
15.0000 mL | Freq: Once | OROMUCOSAL | Status: AC
  Administered 2020-06-19: 15 mL via OROMUCOSAL
  Filled 2020-06-18: qty 15

## 2020-06-18 NOTE — Anesthesia Preprocedure Evaluation (Addendum)
Anesthesia Evaluation  Patient identified by MRN, date of birth, ID band Patient awake    Reviewed: Allergy & Precautions, NPO status , Patient's Chart, lab work & pertinent test results  Airway Mallampati: III  TM Distance: >3 FB Neck ROM: Full    Dental  (+) Dental Advisory Given   Pulmonary neg pulmonary ROS,    breath sounds clear to auscultation       Cardiovascular hypertension, Pt. on medications + Past MI   Rhythm:Regular Rate:Normal  Normal EF. Valves okay   Neuro/Psych negative neurological ROS     GI/Hepatic negative GI ROS, Neg liver ROS,   Endo/Other  diabetes, Type 2  Renal/GU CRFRenal disease     Musculoskeletal   Abdominal   Peds  Hematology  (+) anemia ,   Anesthesia Other Findings   Reproductive/Obstetrics                            Lab Results  Component Value Date   WBC 8.0 06/18/2020   HGB 12.4 (L) 06/18/2020   HCT 37.8 (L) 06/18/2020   MCV 87.1 06/18/2020   PLT 151 06/18/2020   Lab Results  Component Value Date   CREATININE 1.67 (H) 06/17/2020   BUN 20 06/17/2020   NA 138 06/17/2020   K 4.3 06/17/2020   CL 104 06/17/2020   CO2 23 06/17/2020    Anesthesia Physical Anesthesia Plan  ASA: IV  Anesthesia Plan: General   Post-op Pain Management:    Induction: Intravenous  PONV Risk Score and Plan: 2 and Dexamethasone, Ondansetron and Treatment may vary due to age or medical condition  Airway Management Planned: Oral ETT  Additional Equipment: Arterial line, CVP, TEE and Ultrasound Guidance Line Placement  Intra-op Plan:   Post-operative Plan: Post-operative intubation/ventilation  Informed Consent: I have reviewed the patients History and Physical, chart, labs and discussed the procedure including the risks, benefits and alternatives for the proposed anesthesia with the patient or authorized representative who has indicated his/her understanding  and acceptance.     Dental advisory given  Plan Discussed with: CRNA  Anesthesia Plan Comments:        Anesthesia Quick Evaluation

## 2020-06-18 NOTE — Progress Notes (Signed)
ANTICOAGULATION CONSULT NOTE Pharmacy Consult for Heparin Indication: chest pain/ACS  No Known Allergies  Patient Measurements: Per pharmacist at [facility]: Weight: 123.5 kg (121.6 kg here); Height: 74 inches Heparin Dosing Weight: 108.5 kg  Vital Signs: Temp: 98.4 F (36.9 C) (11/07 0354) Temp Source: Oral (11/07 0354) BP: 112/68 (11/07 0354) Pulse Rate: 63 (11/07 0354)  Labs: Recent Labs    06/16/20 0209 06/16/20 0815 06/17/20 0302 06/17/20 1429 06/18/20 0228  HGB 12.1*  --  12.5*  --  12.4*  HCT 36.2*  --  36.7*  --  37.8*  PLT 140*  --  128*  --  151  HEPARINUNFRC  --    < > 0.23* 0.30 0.28*  CREATININE  --   --  1.67*  --   --    < > = values in this interval not displayed.    Estimated Creatinine Clearance: 53.1 mL/min (A) (by C-G formula based on SCr of 1.67 mg/dL (H)).  Assessment: 74 y.o. male with CAD s/p cath and awaiting CABG for heparin.  Heparin level this afternoon is slightly subtherapeutic at 0.28.  No overt bleeding or complications noted per nursing. Hgb, hct, plt are stable.  Goal of Therapy:  Heparin level 0.3-0.7 units/ml Monitor platelets by anticoagulation protocol: Yes   Plan:  Increase IV heparin to 2100 units/hr Check heparin level in 8 hours Daily heparin level and CBC.  Lamar Sprinkles, PharmD PGY1 Pharmacy Resident 06/18/2020 7:05 AM   [facility] pharmacy phone numbers are listed on [email or web address]

## 2020-06-18 NOTE — Progress Notes (Signed)
Documentation by nursing student Jennifer Siu verified by this supervising RN 

## 2020-06-18 NOTE — Progress Notes (Signed)
ANTICOAGULATION CONSULT NOTE Pharmacy Consult for Heparin Indication: chest pain/ACS  No Known Allergies  Patient Measurements: Per pharmacist at [facility]: Weight: 123.5 kg (121.6 kg here); Height: 74 inches Heparin Dosing Weight: 108.5 kg  Vital Signs: Temp: 98.6 F (37 C) (11/07 1616) Temp Source: Oral (11/07 1616) BP: 116/71 (11/07 1616) Pulse Rate: 63 (11/07 1616)  Labs: Recent Labs    06/16/20 0209 06/16/20 0815 06/17/20 0302 06/17/20 0302 06/17/20 1429 06/18/20 0228 06/18/20 1637  HGB 12.1*  --  12.5*  --   --  12.4*  --   HCT 36.2*  --  36.7*  --   --  37.8*  --   PLT 140*  --  128*  --   --  151  --   HEPARINUNFRC  --    < > 0.23*   < > 0.30 0.28* 0.25*  CREATININE  --   --  1.67*  --   --   --   --    < > = values in this interval not displayed.    Estimated Creatinine Clearance: 53.1 mL/min (A) (by C-G formula based on SCr of 1.67 mg/dL (H)).  Assessment: 74 y.o. male with CAD s/p cath and awaiting CABG for heparin.  Heparin level this evening is slightly subtherapeutic at 0.25.  No overt bleeding or complications noted per nursing. Hgb, hct, plt are stable.  Goal of Therapy:  Heparin level 0.3-0.7 units/ml Monitor platelets by anticoagulation protocol: Yes   Plan:  Increase IV heparin to 2200 units/hr Daily heparin level and CBC. Heparin off for CABG in the AM.  Reece Leader, Loura Back, BCPS, [facility] Clinical Pharmacist  06/18/2020 7:21 PM   [facility] pharmacy phone numbers are listed on [email or web address]   [facility] pharmacy phone numbers are listed on [email or web address]

## 2020-06-18 NOTE — Progress Notes (Signed)
      [address]       [name] [address]             [phone]      Patient without chest pain.  Nervous about surgery tomorrow.  All questions addressed and answered.  For CABG tomorrow with Dr. Cliffton Asters.   Lowella Dandy, PA-C

## 2020-06-18 NOTE — Progress Notes (Signed)
Progress Note  Patient Name: Larry May Date of Encounter: 06/18/2020  [facility] Cardiologist: Norman Herrlich, MD  Subjective   No complaints  Inpatient Medications    Scheduled Meds: . aspirin  81 mg Oral Daily  . atorvastatin  80 mg Oral Daily  . [START ON 06/19/2020] epinephrine  0-10 mcg/min Intravenous To OR  . [START ON 06/19/2020] heparin-papaverine-plasmalyte irrigation   Irrigation To OR  . insulin aspart  0-15 Units Subcutaneous TID WC  . [START ON 06/19/2020] insulin   Intravenous To OR  . [START ON 06/19/2020] [place] Blood Cardioplegia vial (lidocaine/magnesium/mannitol 0.26g-4g-6.4g)   Intracoronary Once  . [START ON 06/19/2020] magnesium sulfate  40 mEq Other To OR  . metoprolol tartrate  25 mg Oral BID  . [START ON 06/19/2020] phenylephrine  30-200 mcg/min Intravenous To OR  . [START ON 06/19/2020] potassium chloride  80 mEq Other To OR  . sodium chloride flush  3 mL Intravenous Q12H  . [START ON 06/19/2020] tranexamic acid  15 mg/kg Intravenous To OR  . [START ON 06/19/2020] tranexamic acid  2 mg/kg Intracatheter To OR   Continuous Infusions: . sodium chloride    . [START ON 06/19/2020] cefUROXime (ZINACEF)  IV    . [START ON 06/19/2020] cefUROXime (ZINACEF)  IV    . [START ON 06/19/2020] dexmedetomidine    . [START ON 06/19/2020] heparin 30,000 units/NS 1000 mL solution for CELLSAVER    . heparin 2,100 Units/hr (06/18/20 0800)  . [START ON 06/19/2020] milrinone    . [START ON 06/19/2020] nitroGLYCERIN    . [START ON 06/19/2020] norepinephrine    . [START ON 06/19/2020] tranexamic acid (CYKLOKAPRON) infusion ([facility])    . [START ON 06/19/2020] vancomycin     PRN Meds: sodium chloride, acetaminophen, ondansetron (ZOFRAN) IV, sodium chloride flush   Vital Signs    Vitals:   06/17/20 0901 06/17/20 1123 06/17/20 1940 06/18/20 0354  BP: 129/68 (!) 113/56 (!) 109/57 112/68  Pulse: 67 64  63  Resp:    20  Temp:  98.1 F (36.7 C) (!) 97.5 F (36.4 C) 98.4 F  (36.9 C)  TempSrc:  Oral Oral Oral  SpO2:  98%  97%  Weight:    125.9 kg  Height:        Intake/Output Summary (Last 24 hours) at 06/18/2020 0939 Last data filed at 06/18/2020 0846 Gross per 24 hour  Intake 1185.67 ml  Output 1000 ml  Net 185.67 ml   Last 3 Weights 06/18/2020 06/14/2020  Weight (lbs) 277 lb 9 oz 268 lb 1.3 oz  Weight (kg) 125.9 kg 121.6 kg      Telemetry    SR - Personally Reviewed  ECG    n/a - Personally Reviewed  Physical Exam   GEN: No acute distress.   Neck: No JVD Cardiac: RRR, no murmurs, rubs, or gallops.  Respiratory: Clear to auscultation bilaterally. GI: Soft, nontender, non-distended  MS: No edema; No deformity. Neuro:  Nonfocal  Psych: Normal affect   Labs    High Sensitivity Troponin:  No results for input(s): TROPONINIHS in the last 720 hours.    Chemistry Recent Labs  Lab 06/15/20 0253 06/17/20 0302  NA 137 138  K 4.5 4.3  CL 105 104  CO2 23 23  GLUCOSE 160* 159*  BUN 18 20  CREATININE 1.49* 1.67*  CALCIUM 8.6* 8.8*  GFRNONAA 48* 42*  ANIONGAP 9 11     Hematology Recent Labs  Lab 06/16/20 0209 06/17/20 0302 06/18/20  0228  WBC 7.0 7.3 8.0  RBC 4.19* 4.25 4.34  HGB 12.1* 12.5* 12.4*  HCT 36.2* 36.7* 37.8*  MCV 86.4 86.4 87.1  MCH 28.9 29.4 28.6  MCHC 33.4 34.1 32.8  RDW 12.9 13.0 13.2  PLT 140* 128* 151    BNPNo results for input(s): BNP, PROBNP in the last 168 hours.   DDimer No results for input(s): DDIMER in the last 168 hours.   Radiology    DG Chest 2 View  Result Date: 06/16/2020 CLINICAL DATA:  Preoperative for bypass surgery EXAM: CHEST - 2 VIEW COMPARISON:  None. FINDINGS: Normal heart size. Atherosclerotic mildly tortuous thoracic aorta. Otherwise normal mediastinal contour. No pneumothorax. No pleural effusion. Lungs appear clear, with no acute consolidative airspace disease and no pulmonary edema. IMPRESSION: No active cardiopulmonary disease. Electronically Signed   By: Delbert Phenix M.D.   On:  06/16/2020 10:29    Cardiac Studies   06/14/20 cath  Mid RCA to Dist RCA lesion is 80% stenosed.  Dist RCA lesion is 40% stenosed.  RPDA lesion is 99% stenosed.  1st Mrg lesion is 50% stenosed.  2nd Mrg lesion is 99% stenosed.  Dist Cx lesion is 100% stenosed.  Ost Cx lesion is 80% stenosed.  Prox LAD lesion is 70% stenosed.  1st Diag-1 lesion is 70% stenosed.  1st Diag-2 lesion is 60% stenosed.  Dist LAD lesion is 40% stenosed.  Patient Profile      74 y.o.malewith a PMH significant for HTN, DM, and HLD who presented to [facility] with CP found to have NSTEMI. He was transferred to [facility] for heart cath which showed severe multivessel disease. CT surgery consulted and he is awaiting CABG.  Pt's anginal equivalent seems to be back pain with occasional left arm pain. He had been having this since January, worse when he walks uphill from [employer]. He is very active on a horse [place].   Assessment & Plan    1. NSTEMI - cath this admit with severe multivessel disease as reported above - 06/15/20 echo: LVEF 55-60%, no WMAs, grade I DDx, normal RV function - medical therapy with ASA 81, atorva 80, hep gtt, lopressor 25mg  bid. No ACE/ARB due to renal function  - denies any symptoms, plan for CABG tomorrow.    2. HTN PTA - was on 20 mg lisinopril andspiro-HCTZ 25-25 - holding both ACEI and spiro for renal function - have added lopressor  3. Hyperlipidemia - LDL 66 this admit, continue statin    For questions or updates, please contact [facility] Please consult [email or web address] for contact info under        Signed, , MD  06/18/2020, 9:39 AM

## 2020-06-18 NOTE — Progress Notes (Signed)
   06/18/20 1953  Assess: MEWS Score  Temp 99.4 F (37.4 C)  BP 121/62  Pulse Rate 67  ECG Heart Rate 68  Resp (!) 25  Level of Consciousness Alert  SpO2 94 %  O2 Device Room Air  Patient Activity (if Appropriate) In bed  Assess: MEWS Score  MEWS Temp 0  MEWS Systolic 0  MEWS Pulse 0  MEWS RR 1  MEWS LOC 0  MEWS Score 1  MEWS Score Color Green  Treat  Pain Scale 0-10  Pain Score 0

## 2020-06-19 ENCOUNTER — Inpatient Hospital Stay (HOSPITAL_COMMUNITY): Payer: Medicare HMO | Admitting: Anesthesiology

## 2020-06-19 ENCOUNTER — Inpatient Hospital Stay (HOSPITAL_COMMUNITY): Payer: Medicare HMO

## 2020-06-19 ENCOUNTER — Encounter (HOSPITAL_COMMUNITY): Payer: Self-pay | Admitting: Thoracic Surgery (Cardiothoracic Vascular Surgery)

## 2020-06-19 ENCOUNTER — Inpatient Hospital Stay (HOSPITAL_COMMUNITY)
Admission: AD | Disposition: A | Discharge status: Home / Self Care | Payer: Self-pay | Source: Other Acute Inpatient Hospital | Attending: Thoracic Surgery (Cardiothoracic Vascular Surgery)

## 2020-06-19 DIAGNOSIS — Z951 Presence of aortocoronary bypass graft: Secondary | ICD-10-CM

## 2020-06-19 DIAGNOSIS — I2510 Atherosclerotic heart disease of native coronary artery without angina pectoris: Secondary | ICD-10-CM | POA: Diagnosis present

## 2020-06-19 DIAGNOSIS — I25110 Atherosclerotic heart disease of native coronary artery with unstable angina pectoris: Secondary | ICD-10-CM

## 2020-06-19 HISTORY — PX: CORONARY ARTERY BYPASS GRAFT: SHX141

## 2020-06-19 HISTORY — DX: Atherosclerotic heart disease of native coronary artery without angina pectoris: I25.10

## 2020-06-19 HISTORY — PX: TEE WITHOUT CARDIOVERSION: SHX5443

## 2020-06-19 HISTORY — DX: Presence of aortocoronary bypass graft: Z95.1

## 2020-06-19 LAB — COMPREHENSIVE METABOLIC PANEL
ALT: 31 U/L (ref 0–44)
AST: 29 U/L (ref 15–41)
Albumin: 3.3 g/dL — ABNORMAL LOW (ref 3.5–5.0)
Alkaline Phosphatase: 57 U/L (ref 38–126)
Anion gap: 7 (ref 5–15)
BUN: 25 mg/dL — ABNORMAL HIGH (ref 8–23)
CO2: 25 mmol/L (ref 22–32)
Calcium: 8.6 mg/dL — ABNORMAL LOW (ref 8.9–10.3)
Chloride: 103 mmol/L (ref 98–111)
Creatinine, Ser: 1.8 mg/dL — ABNORMAL HIGH (ref 0.61–1.24)
GFR, Estimated: 39 mL/min — ABNORMAL LOW (ref >60)
Glucose, Bld: 183 mg/dL — ABNORMAL HIGH (ref 70–99)
Potassium: 4.6 mmol/L (ref 3.5–5.1)
Sodium: 135 mmol/L (ref 135–145)
Total Bilirubin: 0.8 mg/dL (ref 0.3–1.2)
Total Protein: 6.6 g/dL (ref 6.5–8.1)

## 2020-06-19 LAB — GLUCOSE, CAPILLARY
Glucose-Capillary: 185 mg/dL — ABNORMAL HIGH (ref 70–99)
Glucose-Capillary: 119 mg/dL — ABNORMAL HIGH (ref 70–99)
Glucose-Capillary: 131 mg/dL — ABNORMAL HIGH (ref 70–99)
Glucose-Capillary: 101 mg/dL — ABNORMAL HIGH (ref 70–99)
Glucose-Capillary: 133 mg/dL — ABNORMAL HIGH (ref 70–99)
Glucose-Capillary: 149 mg/dL — ABNORMAL HIGH (ref 70–99)
Glucose-Capillary: 140 mg/dL — ABNORMAL HIGH (ref 70–99)
Glucose-Capillary: 134 mg/dL — ABNORMAL HIGH (ref 70–99)
Glucose-Capillary: 137 mg/dL — ABNORMAL HIGH (ref 70–99)

## 2020-06-19 LAB — POCT I-STAT 7, (LYTES, BLD GAS, ICA,H+H)
Acid-Base Excess: 1 mmol/L (ref 0.0–2.0)
Acid-base deficit: 1 mmol/L (ref 0.0–2.0)
Acid-base deficit: 2 mmol/L (ref 0.0–2.0)
Bicarbonate: 24.9 mmol/L (ref 20.0–28.0)
Bicarbonate: 26.9 mmol/L (ref 20.0–28.0)
Bicarbonate: 23.3 mmol/L (ref 20.0–28.0)
Calcium, Ion: 1.19 mmol/L (ref 1.15–1.40)
Calcium, Ion: 1.04 mmol/L — ABNORMAL LOW (ref 1.15–1.40)
Calcium, Ion: 1.2 mmol/L (ref 1.15–1.40)
HCT: 33 % — ABNORMAL LOW (ref 39.0–52.0)
HCT: 26 % — ABNORMAL LOW (ref 39.0–52.0)
HCT: 25 % — ABNORMAL LOW (ref 39.0–52.0)
Hemoglobin: 11.2 g/dL — ABNORMAL LOW (ref 13.0–17.0)
Hemoglobin: 8.8 g/dL — ABNORMAL LOW (ref 13.0–17.0)
Hemoglobin: 8.5 g/dL — ABNORMAL LOW (ref 13.0–17.0)
O2 Saturation: 100 %
O2 Saturation: 100 %
O2 Saturation: 100 %
Potassium: 4.3 mmol/L (ref 3.5–5.1)
Potassium: 4.5 mmol/L (ref 3.5–5.1)
Potassium: 4.1 mmol/L (ref 3.5–5.1)
Sodium: 138 mmol/L (ref 135–145)
Sodium: 139 mmol/L (ref 135–145)
Sodium: 140 mmol/L (ref 135–145)
TCO2: 26 mmol/L (ref 22–32)
TCO2: 28 mmol/L (ref 22–32)
TCO2: 24 mmol/L (ref 22–32)
pCO2 arterial: 46.5 mmHg (ref 32.0–48.0)
pCO2 arterial: 47 mmHg (ref 32.0–48.0)
pCO2 arterial: 39 mmHg (ref 32.0–48.0)
pH, Arterial: 7.336 — ABNORMAL LOW (ref 7.350–7.450)
pH, Arterial: 7.365 (ref 7.350–7.450)
pH, Arterial: 7.385 (ref 7.350–7.450)
pO2, Arterial: 312 mmHg — ABNORMAL HIGH (ref 83.0–108.0)
pO2, Arterial: 352 mmHg — ABNORMAL HIGH (ref 83.0–108.0)
pO2, Arterial: 171 mmHg — ABNORMAL HIGH (ref 83.0–108.0)

## 2020-06-19 LAB — POCT I-STAT, CHEM 8
BUN: 25 mg/dL — ABNORMAL HIGH (ref 8–23)
BUN: 27 mg/dL — ABNORMAL HIGH (ref 8–23)
BUN: 24 mg/dL — ABNORMAL HIGH (ref 8–23)
BUN: 25 mg/dL — ABNORMAL HIGH (ref 8–23)
BUN: 22 mg/dL (ref 8–23)
Calcium, Ion: 1.14 mmol/L — ABNORMAL LOW (ref 1.15–1.40)
Calcium, Ion: 1.21 mmol/L (ref 1.15–1.40)
Calcium, Ion: 1.04 mmol/L — ABNORMAL LOW (ref 1.15–1.40)
Calcium, Ion: 1.07 mmol/L — ABNORMAL LOW (ref 1.15–1.40)
Calcium, Ion: 1.22 mmol/L (ref 1.15–1.40)
Chloride: 104 mmol/L (ref 98–111)
Chloride: 105 mmol/L (ref 98–111)
Chloride: 103 mmol/L (ref 98–111)
Chloride: 103 mmol/L (ref 98–111)
Chloride: 106 mmol/L (ref 98–111)
Creatinine, Ser: 1.4 mg/dL — ABNORMAL HIGH (ref 0.61–1.24)
Creatinine, Ser: 1.5 mg/dL — ABNORMAL HIGH (ref 0.61–1.24)
Creatinine, Ser: 1.4 mg/dL — ABNORMAL HIGH (ref 0.61–1.24)
Creatinine, Ser: 1.4 mg/dL — ABNORMAL HIGH (ref 0.61–1.24)
Creatinine, Ser: 1.2 mg/dL (ref 0.61–1.24)
Glucose, Bld: 133 mg/dL — ABNORMAL HIGH (ref 70–99)
Glucose, Bld: 147 mg/dL — ABNORMAL HIGH (ref 70–99)
Glucose, Bld: 118 mg/dL — ABNORMAL HIGH (ref 70–99)
Glucose, Bld: 125 mg/dL — ABNORMAL HIGH (ref 70–99)
Glucose, Bld: 137 mg/dL — ABNORMAL HIGH (ref 70–99)
HCT: 30 % — ABNORMAL LOW (ref 39.0–52.0)
HCT: 32 % — ABNORMAL LOW (ref 39.0–52.0)
HCT: 26 % — ABNORMAL LOW (ref 39.0–52.0)
HCT: 26 % — ABNORMAL LOW (ref 39.0–52.0)
HCT: 27 % — ABNORMAL LOW (ref 39.0–52.0)
Hemoglobin: 10.2 g/dL — ABNORMAL LOW (ref 13.0–17.0)
Hemoglobin: 10.9 g/dL — ABNORMAL LOW (ref 13.0–17.0)
Hemoglobin: 8.8 g/dL — ABNORMAL LOW (ref 13.0–17.0)
Hemoglobin: 8.8 g/dL — ABNORMAL LOW (ref 13.0–17.0)
Hemoglobin: 9.2 g/dL — ABNORMAL LOW (ref 13.0–17.0)
Potassium: 4.2 mmol/L (ref 3.5–5.1)
Potassium: 4.3 mmol/L (ref 3.5–5.1)
Potassium: 5 mmol/L (ref 3.5–5.1)
Potassium: 5 mmol/L (ref 3.5–5.1)
Potassium: 4.1 mmol/L (ref 3.5–5.1)
Sodium: 137 mmol/L (ref 135–145)
Sodium: 139 mmol/L (ref 135–145)
Sodium: 138 mmol/L (ref 135–145)
Sodium: 138 mmol/L (ref 135–145)
Sodium: 140 mmol/L (ref 135–145)
TCO2: 23 mmol/L (ref 22–32)
TCO2: 25 mmol/L (ref 22–32)
TCO2: 23 mmol/L (ref 22–32)
TCO2: 25 mmol/L (ref 22–32)
TCO2: 21 mmol/L — ABNORMAL LOW (ref 22–32)

## 2020-06-19 LAB — POCT I-STAT EG7
Acid-base deficit: 2 mmol/L (ref 0.0–2.0)
Bicarbonate: 23.8 mmol/L (ref 20.0–28.0)
Calcium, Ion: 1.04 mmol/L — ABNORMAL LOW (ref 1.15–1.40)
HCT: 26 % — ABNORMAL LOW (ref 39.0–52.0)
Hemoglobin: 8.8 g/dL — ABNORMAL LOW (ref 13.0–17.0)
O2 Saturation: 81 %
Potassium: 5.1 mmol/L (ref 3.5–5.1)
Sodium: 139 mmol/L (ref 135–145)
TCO2: 25 mmol/L (ref 22–32)
pCO2, Ven: 44.4 mmHg (ref 44.0–60.0)
pH, Ven: 7.338 (ref 7.250–7.430)
pO2, Ven: 48 mmHg — ABNORMAL HIGH (ref 32.0–45.0)

## 2020-06-19 LAB — CBC
HCT: 37.4 % — ABNORMAL LOW (ref 39.0–52.0)
HCT: 30.9 % — ABNORMAL LOW (ref 39.0–52.0)
HCT: 31.8 % — ABNORMAL LOW (ref 39.0–52.0)
Hemoglobin: 12.4 g/dL — ABNORMAL LOW (ref 13.0–17.0)
Hemoglobin: 10.1 g/dL — ABNORMAL LOW (ref 13.0–17.0)
Hemoglobin: 10.6 g/dL — ABNORMAL LOW (ref 13.0–17.0)
MCH: 29 pg (ref 26.0–34.0)
MCH: 28.5 pg (ref 26.0–34.0)
MCH: 29 pg (ref 26.0–34.0)
MCHC: 33.2 g/dL (ref 30.0–36.0)
MCHC: 32.7 g/dL (ref 30.0–36.0)
MCHC: 33.3 g/dL (ref 30.0–36.0)
MCV: 87.4 fL (ref 80.0–100.0)
MCV: 87 fL (ref 80.0–100.0)
MCV: 87.1 fL (ref 80.0–100.0)
Platelets: 154 10*3/uL (ref 150–400)
Platelets: 94 10*3/uL — ABNORMAL LOW (ref 150–400)
Platelets: 142 10*3/uL — ABNORMAL LOW (ref 150–400)
RBC: 4.28 MIL/uL (ref 4.22–5.81)
RBC: 3.55 MIL/uL — ABNORMAL LOW (ref 4.22–5.81)
RBC: 3.65 MIL/uL — ABNORMAL LOW (ref 4.22–5.81)
RDW: 13.3 % (ref 11.5–15.5)
RDW: 13.1 % (ref 11.5–15.5)
RDW: 13.2 % (ref 11.5–15.5)
WBC: 8.1 10*3/uL (ref 4.0–10.5)
WBC: 9.5 10*3/uL (ref 4.0–10.5)
WBC: 10.7 10*3/uL — ABNORMAL HIGH (ref 4.0–10.5)
nRBC: 0 % (ref 0.0–0.2)
nRBC: 0 % (ref 0.0–0.2)
nRBC: 0 % (ref 0.0–0.2)

## 2020-06-19 LAB — BASIC METABOLIC PANEL
Anion gap: 6 (ref 5–15)
BUN: 22 mg/dL (ref 8–23)
CO2: 20 mmol/L — ABNORMAL LOW (ref 22–32)
Calcium: 7.9 mg/dL — ABNORMAL LOW (ref 8.9–10.3)
Chloride: 107 mmol/L (ref 98–111)
Creatinine, Ser: 1.34 mg/dL — ABNORMAL HIGH (ref 0.61–1.24)
GFR, Estimated: 55 mL/min — ABNORMAL LOW (ref >60)
Glucose, Bld: 152 mg/dL — ABNORMAL HIGH (ref 70–99)
Potassium: 5.2 mmol/L — ABNORMAL HIGH (ref 3.5–5.1)
Sodium: 133 mmol/L — ABNORMAL LOW (ref 135–145)

## 2020-06-19 LAB — PROTIME-INR
INR: 1.1 (ref 0.8–1.2)
INR: 1.4 — ABNORMAL HIGH (ref 0.8–1.2)
INR: 1.3 — ABNORMAL HIGH (ref 0.8–1.2)
Prothrombin Time: 13.7 seconds (ref 11.4–15.2)
Prothrombin Time: 16.5 seconds — ABNORMAL HIGH (ref 11.4–15.2)
Prothrombin Time: 15.9 seconds — ABNORMAL HIGH (ref 11.4–15.2)

## 2020-06-19 LAB — PLATELET COUNT: Platelets: 114 10*3/uL — ABNORMAL LOW (ref 150–400)

## 2020-06-19 LAB — TYPE AND SCREEN
ABO/RH(D): B POS
Antibody Screen: NEGATIVE

## 2020-06-19 LAB — HEMOGLOBIN AND HEMATOCRIT, BLOOD
HCT: 26.3 % — ABNORMAL LOW (ref 39.0–52.0)
Hemoglobin: 8.8 g/dL — ABNORMAL LOW (ref 13.0–17.0)

## 2020-06-19 LAB — APTT
aPTT: 94 seconds — ABNORMAL HIGH (ref 24–36)
aPTT: 32 seconds (ref 24–36)
aPTT: 35 seconds (ref 24–36)

## 2020-06-19 LAB — ECHO INTRAOPERATIVE TEE
AV Mean grad: 4 mmHg
AV Peak grad: 6.3 mmHg
Ao pk vel: 1.25 m/s
Height: 74 in
Weight: 4369.6 oz

## 2020-06-19 LAB — ABO/RH: ABO/RH(D): B POS

## 2020-06-19 LAB — HEMOGLOBIN A1C
Hgb A1c MFr Bld: 6.7 % — ABNORMAL HIGH (ref 4.8–5.6)
Mean Plasma Glucose: 145.59 mg/dL

## 2020-06-19 LAB — MAGNESIUM: Magnesium: 2.7 mg/dL — ABNORMAL HIGH (ref 1.7–2.4)

## 2020-06-19 LAB — HEPARIN LEVEL (UNFRACTIONATED): Heparin Unfractionated: 0.49 IU/mL (ref 0.30–0.70)

## 2020-06-19 SURGERY — CORONARY ARTERY BYPASS GRAFTING (CABG)
Anesthesia: General | Site: Chest

## 2020-06-19 MED ORDER — MIDAZOLAM HCL 2 MG/2ML IJ SOLN: 2.0000 mg | INTRAMUSCULAR | Status: DC | PRN

## 2020-06-19 MED ORDER — ROCURONIUM BROMIDE 10 MG/ML (PF) SYRINGE
PREFILLED_SYRINGE | INTRAVENOUS | Status: DC | PRN
  Administered 2020-06-19: 50 mg via INTRAVENOUS
  Administered 2020-06-19: 100 mg via INTRAVENOUS
  Administered 2020-06-19: 50 mg via INTRAVENOUS

## 2020-06-19 MED ORDER — PHENYLEPHRINE HCL-NACL 20-0.9 MG/250ML-% IV SOLN
0.0000 ug/min | INTRAVENOUS | Status: DC
  Administered 2020-06-20: 20 ug/min via INTRAVENOUS
  Administered 2020-06-20: 15 ug/min via INTRAVENOUS
  Filled 2020-06-19 (×2): qty 250

## 2020-06-19 MED ORDER — ACETAMINOPHEN 160 MG/5ML PO SOLN
650.0000 mg | Freq: Once | ORAL | Status: AC
  Administered 2020-06-19: 650 mg

## 2020-06-19 MED ORDER — SODIUM BICARBONATE 8.4 % IV SOLN: 100.0000 meq | Freq: Once | INTRAVENOUS | Status: AC

## 2020-06-19 MED ORDER — VANCOMYCIN HCL IN DEXTROSE 1-5 GM/200ML-% IV SOLN
1000.0000 mg | Freq: Once | INTRAVENOUS | Status: AC
  Administered 2020-06-19: 1000 mg via INTRAVENOUS
  Filled 2020-06-19: qty 200

## 2020-06-19 MED ORDER — LACTATED RINGERS IV SOLN: INTRAVENOUS | Status: DC | PRN

## 2020-06-19 MED ORDER — NICARDIPINE HCL IN NACL 20-0.86 MG/200ML-% IV SOLN
0.0000 mg/h | INTRAVENOUS | Status: DC
  Filled 2020-06-19 (×2): qty 200

## 2020-06-19 MED ORDER — DEXMEDETOMIDINE HCL IN NACL 400 MCG/100ML IV SOLN
0.0000 ug/kg/h | INTRAVENOUS | Status: DC
  Administered 2020-06-19: 0.7 ug/kg/h via INTRAVENOUS

## 2020-06-19 MED ORDER — SODIUM CHLORIDE (PF) 0.9 % IJ SOLN
INTRAMUSCULAR | Status: AC
  Filled 2020-06-19: qty 10

## 2020-06-19 MED ORDER — METOPROLOL TARTRATE 12.5 MG HALF TABLET
12.5000 mg | ORAL_TABLET | Freq: Two times a day (BID) | ORAL | Status: DC
  Administered 2020-06-20 – 2020-06-23 (×7): 12.5 mg via ORAL
  Filled 2020-06-19 (×7): qty 1

## 2020-06-19 MED ORDER — POTASSIUM CHLORIDE 10 MEQ/50ML IV SOLN
10.0000 meq | INTRAVENOUS | Status: AC
  Administered 2020-06-19 (×3): 10 meq via INTRAVENOUS

## 2020-06-19 MED ORDER — OXYCODONE HCL 5 MG PO TABS
5.0000 mg | ORAL_TABLET | ORAL | Status: DC | PRN
  Administered 2020-06-20 – 2020-06-25 (×7): 5 mg via ORAL
  Filled 2020-06-19 (×7): qty 1

## 2020-06-19 MED ORDER — ALBUMIN HUMAN 5 % IV SOLN
250.0000 mL | INTRAVENOUS | Status: AC | PRN
  Administered 2020-06-19 – 2020-06-20 (×4): 12.5 g via INTRAVENOUS
  Filled 2020-06-19 (×2): qty 250

## 2020-06-19 MED ORDER — NITROGLYCERIN IN D5W 200-5 MCG/ML-% IV SOLN: 0.0000 ug/min | INTRAVENOUS | Status: DC

## 2020-06-19 MED ORDER — TRAMADOL HCL 50 MG PO TABS
50.0000 mg | ORAL_TABLET | ORAL | Status: DC | PRN
  Administered 2020-06-20: 50 mg via ORAL
  Filled 2020-06-19: qty 1

## 2020-06-19 MED ORDER — FENTANYL CITRATE (PF) 250 MCG/5ML IJ SOLN
INTRAMUSCULAR | Status: AC
  Filled 2020-06-19: qty 25

## 2020-06-19 MED ORDER — INSULIN REGULAR(HUMAN) IN NACL 100-0.9 UT/100ML-% IV SOLN: INTRAVENOUS | Status: DC

## 2020-06-19 MED ORDER — ACETAMINOPHEN 160 MG/5ML PO SOLN: 1000.0000 mg | Freq: Four times a day (QID) | ORAL | Status: AC

## 2020-06-19 MED ORDER — DOCUSATE SODIUM 100 MG PO CAPS
200.0000 mg | ORAL_CAPSULE | Freq: Every day | ORAL | Status: DC
  Administered 2020-06-20 – 2020-06-22 (×3): 200 mg via ORAL
  Administered 2020-06-23: 100 mg via ORAL
  Administered 2020-06-24 – 2020-06-25 (×2): 200 mg via ORAL
  Filled 2020-06-19 (×6): qty 2

## 2020-06-19 MED ORDER — CHLORHEXIDINE GLUCONATE 0.12 % MT SOLN
15.0000 mL | OROMUCOSAL | Status: AC
  Administered 2020-06-19: 15 mL via OROMUCOSAL

## 2020-06-19 MED ORDER — ACETAMINOPHEN 650 MG RE SUPP: 650.0000 mg | Freq: Once | RECTAL | Status: AC

## 2020-06-19 MED ORDER — SODIUM BICARBONATE 8.4 % IV SOLN
50.0000 meq | Freq: Once | INTRAVENOUS | Status: AC
  Administered 2020-06-19: 50 meq via INTRAVENOUS

## 2020-06-19 MED ORDER — CHLORHEXIDINE GLUCONATE CLOTH 2 % EX PADS
6.0000 | MEDICATED_PAD | Freq: Every day | CUTANEOUS | Status: DC
  Administered 2020-06-19 – 2020-06-24 (×4): 6 via TOPICAL

## 2020-06-19 MED ORDER — METOPROLOL TARTRATE 5 MG/5ML IV SOLN: 2.5000 mg | INTRAVENOUS | Status: DC | PRN

## 2020-06-19 MED ORDER — FENTANYL CITRATE (PF) 250 MCG/5ML IJ SOLN
INTRAMUSCULAR | Status: DC | PRN
  Administered 2020-06-19 (×3): 100 ug via INTRAVENOUS
  Administered 2020-06-19 (×2): 250 ug via INTRAVENOUS
  Administered 2020-06-19: 150 ug via INTRAVENOUS
  Administered 2020-06-19: 250 ug via INTRAVENOUS
  Administered 2020-06-19: 50 ug via INTRAVENOUS

## 2020-06-19 MED ORDER — SODIUM CHLORIDE 0.9 % IV SOLN
250.0000 mL | INTRAVENOUS | Status: DC
  Administered 2020-06-20: 250 mL via INTRAVENOUS

## 2020-06-19 MED ORDER — METOPROLOL TARTRATE 25 MG/10 ML ORAL SUSPENSION: 12.5000 mg | Freq: Two times a day (BID) | ORAL | Status: DC

## 2020-06-19 MED ORDER — TRANEXAMIC ACID 1000 MG/10ML IV SOLN
INTRAVENOUS | Status: DC | PRN
  Administered 2020-06-19: 1.5 mg/kg/h via INTRAVENOUS

## 2020-06-19 MED ORDER — MIDAZOLAM HCL (PF) 10 MG/2ML IJ SOLN
INTRAMUSCULAR | Status: AC
  Filled 2020-06-19: qty 2

## 2020-06-19 MED ORDER — PLASMA-LYTE 148 IV SOLN
INTRAVENOUS | Status: DC | PRN
  Administered 2020-06-19: 500 mL via INTRAVASCULAR

## 2020-06-19 MED ORDER — FAMOTIDINE IN NACL 20-0.9 MG/50ML-% IV SOLN
20.0000 mg | Freq: Two times a day (BID) | INTRAVENOUS | Status: AC
  Administered 2020-06-19 (×2): 20 mg via INTRAVENOUS
  Filled 2020-06-19: qty 50

## 2020-06-19 MED ORDER — LACTATED RINGERS IV SOLN: INTRAVENOUS | Status: DC

## 2020-06-19 MED ORDER — PROPOFOL 10 MG/ML IV BOLUS
INTRAVENOUS | Status: DC | PRN
  Administered 2020-06-19: 20 mg via INTRAVENOUS
  Administered 2020-06-19: 50 mg via INTRAVENOUS

## 2020-06-19 MED ORDER — SODIUM CHLORIDE 0.9% FLUSH: 3.0000 mL | INTRAVENOUS | Status: DC | PRN

## 2020-06-19 MED ORDER — ASPIRIN 81 MG PO CHEW
324.0000 mg | CHEWABLE_TABLET | Freq: Every day | ORAL | Status: DC
  Filled 2020-06-19 (×2): qty 4

## 2020-06-19 MED ORDER — BISACODYL 10 MG RE SUPP
10.0000 mg | Freq: Every day | RECTAL | Status: DC
  Filled 2020-06-19: qty 1

## 2020-06-19 MED ORDER — MAGNESIUM SULFATE 4 GM/100ML IV SOLN
4.0000 g | Freq: Once | INTRAVENOUS | Status: AC
  Administered 2020-06-19: 4 g via INTRAVENOUS

## 2020-06-19 MED ORDER — SODIUM CHLORIDE 0.9 % IV SOLN: INTRAVENOUS | Status: DC

## 2020-06-19 MED ORDER — MORPHINE SULFATE (PF) 2 MG/ML IV SOLN
1.0000 mg | INTRAVENOUS | Status: DC | PRN
  Administered 2020-06-19: 1 mg via INTRAVENOUS
  Administered 2020-06-19 – 2020-06-20 (×4): 2 mg via INTRAVENOUS
  Filled 2020-06-19 (×6): qty 1

## 2020-06-19 MED ORDER — ROCURONIUM BROMIDE 10 MG/ML (PF) SYRINGE
PREFILLED_SYRINGE | INTRAVENOUS | Status: AC
  Filled 2020-06-19: qty 10

## 2020-06-19 MED ORDER — HEPARIN SODIUM (PORCINE) 1000 UNIT/ML IJ SOLN
INTRAMUSCULAR | Status: AC
  Filled 2020-06-19: qty 2

## 2020-06-19 MED ORDER — SODIUM CHLORIDE 0.9 % IV SOLN: 1.5000 g | Freq: Two times a day (BID) | INTRAVENOUS | Status: DC

## 2020-06-19 MED ORDER — DEXMEDETOMIDINE HCL IN NACL 400 MCG/100ML IV SOLN
0.1000 ug/kg/h | INTRAVENOUS | Status: DC
  Filled 2020-06-19: qty 100

## 2020-06-19 MED ORDER — HEPARIN SODIUM (PORCINE) 1000 UNIT/ML IJ SOLN
INTRAMUSCULAR | Status: DC | PRN
  Administered 2020-06-19: 45000 [IU] via INTRAVENOUS
  Administered 2020-06-19: 5000 [IU] via INTRAVENOUS

## 2020-06-19 MED ORDER — SODIUM CHLORIDE 0.9% FLUSH
3.0000 mL | Freq: Two times a day (BID) | INTRAVENOUS | Status: DC
  Administered 2020-06-20 – 2020-06-22 (×4): 3 mL via INTRAVENOUS

## 2020-06-19 MED ORDER — MIDAZOLAM HCL 5 MG/5ML IJ SOLN
INTRAMUSCULAR | Status: DC | PRN
  Administered 2020-06-19: 2 mg via INTRAVENOUS
  Administered 2020-06-19: 6 mg via INTRAVENOUS
  Administered 2020-06-19: 4 mg via INTRAVENOUS
  Administered 2020-06-19: 2 mg via INTRAVENOUS

## 2020-06-19 MED ORDER — 0.9 % SODIUM CHLORIDE (POUR BTL) OPTIME
TOPICAL | Status: DC | PRN
  Administered 2020-06-19: 5000 mL

## 2020-06-19 MED ORDER — ONDANSETRON HCL 4 MG/2ML IJ SOLN: 4.0000 mg | Freq: Four times a day (QID) | INTRAMUSCULAR | Status: DC | PRN

## 2020-06-19 MED ORDER — PANTOPRAZOLE SODIUM 40 MG PO TBEC
40.0000 mg | DELAYED_RELEASE_TABLET | Freq: Every day | ORAL | Status: DC
  Administered 2020-06-21 – 2020-06-25 (×5): 40 mg via ORAL
  Filled 2020-06-19 (×5): qty 1

## 2020-06-19 MED ORDER — PROTAMINE SULFATE 10 MG/ML IV SOLN
INTRAVENOUS | Status: AC
  Filled 2020-06-19: qty 20

## 2020-06-19 MED ORDER — ASPIRIN EC 325 MG PO TBEC
325.0000 mg | DELAYED_RELEASE_TABLET | Freq: Every day | ORAL | Status: DC
  Administered 2020-06-20 – 2020-06-25 (×6): 325 mg via ORAL
  Filled 2020-06-19 (×6): qty 1

## 2020-06-19 MED ORDER — HEMOSTATIC AGENTS (NO CHARGE) OPTIME
TOPICAL | Status: DC | PRN
  Administered 2020-06-19: 1 via TOPICAL

## 2020-06-19 MED ORDER — SODIUM BICARBONATE 8.4 % IV SOLN
INTRAVENOUS | Status: AC
  Administered 2020-06-19: 100 meq via INTRAVENOUS
  Filled 2020-06-19: qty 50

## 2020-06-19 MED ORDER — BISACODYL 5 MG PO TBEC
10.0000 mg | DELAYED_RELEASE_TABLET | Freq: Every day | ORAL | Status: DC
  Administered 2020-06-20 – 2020-06-25 (×5): 10 mg via ORAL
  Filled 2020-06-19 (×5): qty 2

## 2020-06-19 MED ORDER — DEXTROSE 50 % IV SOLN: 0.0000 mL | INTRAVENOUS | Status: DC | PRN

## 2020-06-19 MED ORDER — PROTAMINE SULFATE 10 MG/ML IV SOLN
INTRAVENOUS | Status: AC
  Filled 2020-06-19: qty 25

## 2020-06-19 MED ORDER — HEPARIN SODIUM (PORCINE) 1000 UNIT/ML IJ SOLN
INTRAMUSCULAR | Status: AC
  Filled 2020-06-19: qty 1

## 2020-06-19 MED ORDER — PHENYLEPHRINE 40 MCG/ML (10ML) SYRINGE FOR IV PUSH (FOR BLOOD PRESSURE SUPPORT)
PREFILLED_SYRINGE | INTRAVENOUS | Status: DC | PRN
  Administered 2020-06-19 (×2): 40 ug via INTRAVENOUS

## 2020-06-19 MED ORDER — SODIUM CHLORIDE 0.45 % IV SOLN: INTRAVENOUS | Status: DC | PRN

## 2020-06-19 MED ORDER — SODIUM CHLORIDE (PF) 0.9 % IJ SOLN
OROMUCOSAL | Status: DC | PRN
  Administered 2020-06-19 (×2): 4 mL via TOPICAL

## 2020-06-19 MED ORDER — ROCURONIUM BROMIDE 10 MG/ML (PF) SYRINGE
PREFILLED_SYRINGE | INTRAVENOUS | Status: AC
  Filled 2020-06-19: qty 20

## 2020-06-19 MED ORDER — MIDAZOLAM HCL 2 MG/2ML IJ SOLN
INTRAMUSCULAR | Status: AC
  Filled 2020-06-19: qty 4

## 2020-06-19 MED ORDER — PROPOFOL 10 MG/ML IV BOLUS
INTRAVENOUS | Status: AC
  Filled 2020-06-19: qty 20

## 2020-06-19 MED ORDER — LACTATED RINGERS IV SOLN
500.0000 mL | Freq: Once | INTRAVENOUS | Status: AC | PRN
  Administered 2020-06-19: 500 mL via INTRAVENOUS

## 2020-06-19 MED ORDER — ALBUMIN HUMAN 5 % IV SOLN: INTRAVENOUS | Status: DC | PRN

## 2020-06-19 MED ORDER — PROTAMINE SULFATE 10 MG/ML IV SOLN
INTRAVENOUS | Status: DC | PRN
  Administered 2020-06-19: 470 mg via INTRAVENOUS

## 2020-06-19 MED ORDER — ACETAMINOPHEN 500 MG PO TABS
1000.0000 mg | ORAL_TABLET | Freq: Four times a day (QID) | ORAL | Status: AC
  Administered 2020-06-20 – 2020-06-24 (×16): 1000 mg via ORAL
  Filled 2020-06-19 (×19): qty 2

## 2020-06-19 SURGICAL SUPPLY — 89 items
BAG DECANTER FOR FLEXI CONT (MISCELLANEOUS) ×3 IMPLANT
BLADE CLIPPER SURG (BLADE) ×3 IMPLANT
BLADE STERNUM SYSTEM 6 (BLADE) ×3 IMPLANT
BLADE SURG 11 STRL SS (BLADE) ×3 IMPLANT
BNDG ELASTIC 4X5.8 VLCR STR LF (GAUZE/BANDAGES/DRESSINGS) ×3 IMPLANT
BNDG ELASTIC 6X5.8 VLCR STR LF (GAUZE/BANDAGES/DRESSINGS) ×3 IMPLANT
BNDG GAUZE ELAST 4 BULKY (GAUZE/BANDAGES/DRESSINGS) ×3 IMPLANT
CABLE SURGICAL S-101-97-12 (CABLE) ×3 IMPLANT
CANISTER SUCT 3000ML PPV (MISCELLANEOUS) ×3 IMPLANT
CANNULA MC2 2 STG 29/37 NON-V (CANNULA) ×2 IMPLANT
CANNULA MC2 TWO STAGE (CANNULA) ×1
CANNULA NON VENT 20FR 12 (CANNULA) ×3 IMPLANT
CANNULA NON VENT 22FR 12 (CANNULA) ×3 IMPLANT
CATH ROBINSON RED A/P 18FR (CATHETERS) ×6 IMPLANT
CLIP RETRACTION 3.0MM CORONARY (MISCELLANEOUS) ×3 IMPLANT
CLIP VESOCCLUDE MED 24/CT (CLIP) IMPLANT
CLIP VESOCCLUDE SM WIDE 24/CT (CLIP) IMPLANT
CONN ST 1/2X1/2  BEN (MISCELLANEOUS) ×1
CONN ST 1/2X1/2 BEN (MISCELLANEOUS) ×2 IMPLANT
CONNECTOR BLAKE 2:1 CARIO BLK (MISCELLANEOUS) ×3 IMPLANT
COVER SURGICAL LIGHT HANDLE (MISCELLANEOUS) ×3 IMPLANT
DERMABOND ADVANCED (GAUZE/BANDAGES/DRESSINGS) ×1
DERMABOND ADVANCED .7 DNX12 (GAUZE/BANDAGES/DRESSINGS) ×2 IMPLANT
DRAIN CHANNEL 19F RND (DRAIN) ×9 IMPLANT
DRAIN CONNECTOR BLAKE 1:1 (MISCELLANEOUS) ×3 IMPLANT
DRAPE CARDIOVASCULAR INCISE (DRAPES) ×1
DRAPE INCISE IOBAN 66X45 STRL (DRAPES) IMPLANT
DRAPE SLUSH/WARMER DISC (DRAPES) ×3 IMPLANT
DRAPE SRG 135X102X78XABS (DRAPES) ×2 IMPLANT
DRSG AQUACEL AG ADV 3.5X10 (GAUZE/BANDAGES/DRESSINGS) ×3 IMPLANT
DRSG COVADERM 4X14 (GAUZE/BANDAGES/DRESSINGS) ×3 IMPLANT
ELECT BLADE 4.0 EZ CLEAN MEGAD (MISCELLANEOUS) ×6
ELECT REM PT RETURN 9FT ADLT (ELECTROSURGICAL) ×6
ELECTRODE BLDE 4.0 EZ CLN MEGD (MISCELLANEOUS) ×4 IMPLANT
ELECTRODE REM PT RTRN 9FT ADLT (ELECTROSURGICAL) ×4 IMPLANT
FELT TEFLON 1X6 (MISCELLANEOUS) ×6 IMPLANT
GAUZE SPONGE 4X4 12PLY STRL (GAUZE/BANDAGES/DRESSINGS) ×6 IMPLANT
GAUZE SPONGE 4X4 12PLY STRL LF (GAUZE/BANDAGES/DRESSINGS) ×6 IMPLANT
GLOVE BIO SURGEON STRL SZ 6.5 (GLOVE) ×15 IMPLANT
GLOVE BIO SURGEON STRL SZ7 (GLOVE) ×6 IMPLANT
GLOVE BIOGEL M STRL SZ7.5 (GLOVE) ×9 IMPLANT
GLOVE BIOGEL PI IND STRL 6.5 (GLOVE) ×12 IMPLANT
GLOVE BIOGEL PI INDICATOR 6.5 (GLOVE) ×6
GOWN STRL REUS W/ TWL LRG LVL3 (GOWN DISPOSABLE) ×14 IMPLANT
GOWN STRL REUS W/ TWL XL LVL3 (GOWN DISPOSABLE) ×4 IMPLANT
GOWN STRL REUS W/TWL LRG LVL3 (GOWN DISPOSABLE) ×7
GOWN STRL REUS W/TWL XL LVL3 (GOWN DISPOSABLE) ×2
HEMOSTAT POWDER SURGIFOAM 1G (HEMOSTASIS) ×6 IMPLANT
INSERT SUTURE HOLDER (MISCELLANEOUS) ×3 IMPLANT
KIT BASIN OR (CUSTOM PROCEDURE TRAY) ×3 IMPLANT
KIT SUCTION CATH 14FR (SUCTIONS) ×3 IMPLANT
KIT TURNOVER KIT B (KITS) ×3 IMPLANT
KIT VASOVIEW HEMOPRO 2 VH 4000 (KITS) ×3 IMPLANT
LEAD PACING MYOCARDI (MISCELLANEOUS) ×3 IMPLANT
MARKER GRAFT CORONARY BYPASS (MISCELLANEOUS) ×9 IMPLANT
NS IRRIG 1000ML POUR BTL (IV SOLUTION) ×15 IMPLANT
PACK ACCESSORY CANNULA KIT (KITS) ×3 IMPLANT
PACK E OPEN HEART (SUTURE) ×3 IMPLANT
PACK OPEN HEART (CUSTOM PROCEDURE TRAY) ×3 IMPLANT
PAD ARMBOARD 7.5X6 YLW CONV (MISCELLANEOUS) ×6 IMPLANT
PAD ELECT DEFIB RADIOL ZOLL (MISCELLANEOUS) ×3 IMPLANT
PENCIL BUTTON HOLSTER BLD 10FT (ELECTRODE) ×3 IMPLANT
POSITIONER HEAD DONUT 9IN (MISCELLANEOUS) ×3 IMPLANT
PUNCH AORTIC ROTATE 4.0MM (MISCELLANEOUS) ×3 IMPLANT
SET CARDIOPLEGIA MPS 5001102 (MISCELLANEOUS) ×3 IMPLANT
SPONGE LAP 18X18 RF (DISPOSABLE) ×3 IMPLANT
SUPPORT HEART JANKE-BARRON (MISCELLANEOUS) ×3 IMPLANT
SUT BONE WAX W31G (SUTURE) ×3 IMPLANT
SUT ETHIBOND X763 2 0 SH 1 (SUTURE) ×6 IMPLANT
SUT MNCRL AB 3-0 PS2 18 (SUTURE) ×6 IMPLANT
SUT MNCRL AB 4-0 PS2 18 (SUTURE) ×3 IMPLANT
SUT PDS AB 1 CTX 36 (SUTURE) ×6 IMPLANT
SUT PROLENE 4 0 RB 1 (SUTURE) ×1
SUT PROLENE 4 0 SH DA (SUTURE) ×3 IMPLANT
SUT PROLENE 4-0 RB1 .5 CRCL 36 (SUTURE) ×2 IMPLANT
SUT PROLENE 5 0 C 1 36 (SUTURE) ×9 IMPLANT
SUT PROLENE 7 0 BV 1 (SUTURE) ×15 IMPLANT
SUT PROLENE 7 0 BV1 MDA (SUTURE) ×3 IMPLANT
SUT STEEL 6MS V (SUTURE) ×6 IMPLANT
SUT VIC AB 2-0 CT1 27 (SUTURE) ×1
SUT VIC AB 2-0 CT1 TAPERPNT 27 (SUTURE) ×2 IMPLANT
SYSTEM SAHARA CHEST DRAIN ATS (WOUND CARE) ×3 IMPLANT
TAPE PAPER 3X10 WHT MICROPORE (GAUZE/BANDAGES/DRESSINGS) ×3 IMPLANT
TOWEL GREEN STERILE (TOWEL DISPOSABLE) ×3 IMPLANT
TOWEL GREEN STERILE FF (TOWEL DISPOSABLE) ×3 IMPLANT
TRAY FOLEY SLVR 16FR TEMP STAT (SET/KITS/TRAYS/PACK) ×3 IMPLANT
TUBING LAP HI FLOW INSUFFLATIO (TUBING) ×3 IMPLANT
UNDERPAD 30X36 HEAVY ABSORB (UNDERPADS AND DIAPERS) ×3 IMPLANT
WATER STERILE IRR 1000ML POUR (IV SOLUTION) ×6 IMPLANT

## 2020-06-19 NOTE — Anesthesia Procedure Notes (Signed)
Arterial Line Insertion Start/End11/03/2020 6:45 AM, 06/19/2020 7:05 AM Performed by: Jodell Cipro, CRNA, CRNA  Patient location: Pre-op. Preanesthetic checklist: patient identified, IV checked, site marked, risks and benefits discussed, surgical consent, monitors and equipment checked, pre-op evaluation, timeout performed and anesthesia consent Lidocaine 1% used for infiltration and patient sedated Left, radial was placed Catheter size: 20 G Hand hygiene performed  and maximum sterile barriers used  Allen's test indicative of satisfactory collateral circulation Attempts: 2 Procedure performed without using ultrasound guided technique. Following insertion, dressing applied and Biopatch. Post procedure assessment: normal  Patient tolerated the procedure well with no immediate complications.

## 2020-06-19 NOTE — Progress Notes (Signed)
     [address]       [place] [address]             [phone]       No events Vitals:   06/19/20 0703 06/19/20 0704  BP:    Pulse: 63 62  Resp: 14 19  Temp:    SpO2: 99% 97%   Alert NAD Sinus EWOB  74 yo male 3V CAD OR today for CABG 4.  Camillia Marcy Keane Scrape

## 2020-06-19 NOTE — Anesthesia Procedure Notes (Signed)
Central Venous Catheter Insertion Performed by: Marcene Duos, MD, anesthesiologist Start/End11/03/2020 7:00 AM, 06/19/2020 7:15 AM Patient location: Pre-op. Preanesthetic checklist: patient identified, IV checked, site marked, risks and benefits discussed, surgical consent, monitors and equipment checked, pre-op evaluation, timeout performed and anesthesia consent Position: Trendelenburg Lidocaine 1% used for infiltration and patient sedated Hand hygiene performed , maximum sterile barriers used  and Seldinger technique used Catheter size: 8.5 Fr Total catheter length 10. Central line was placed.Sheath introducer Swan type:thermodilution Procedure performed using ultrasound guided technique. Ultrasound Notes:anatomy identified, needle tip was noted to be adjacent to the nerve/plexus identified, no ultrasound evidence of intravascular and/or intraneural injection and image(s) printed for medical record Attempts: 1 Following insertion, line sutured, dressing applied and Biopatch. Post procedure assessment: blood return through all ports, free fluid flow and no air  Patient tolerated the procedure well with no immediate complications.

## 2020-06-19 NOTE — Progress Notes (Signed)
Patient performed a NIF of -21 and a VC of 230 ml's. Patient appears too sleepy at this time for extubation. RT placed patient back on initial full support settings.

## 2020-06-19 NOTE — Op Note (Signed)
[address]       [name] [address]             [phone]                                          06/19/2020 Patient:  Danielle Dess Pre-Op Dx:  3 vessel coronary artery disease   NSTEMI   HTN   Obesity     Diabetes Mellitus Post-op Dx:  same Procedure: CABG X 4.  LIMA LAD, RSVG PDA, OM2, D1   Endoscopic greater saphenous vein harvest on the right Intra-operative Transesophageal Echocardiogram  Surgeon and Role:      * Batsheva Stevick, Eliezer Lofts, MD - Primary    * D.ZImmerman, PA-C - assisting Anesthesia  general EBL:  Blood Administration: none Xclamp Time:  72 min Pump Time:  128 min  Drains: 19 F blake drain: L, mediastinal  Wires: none Counts: correct   Indications: 74 year old male transferred from [facility] on 06/14/2019 with an NSTEMI and chest pain.  He underwent a left heart cath which showed severe three-vessel coronary artery disease.  CTS was consulted to assist with management and he was deemed a good operative candidate for surgical revascularization. Findings: Good LIMA.  Very large vein grafts.  Heavily calcified LAD.  A soft target was noted in the distal LAD.  Good PDA target.  Good flows on the vein graft.  Good OM and diagonal target.  Due to the size of the vein graft a very large anastomosis was created.  Good post cardiopulmonary bypass function on TEE.  Operative Technique: All invasive lines were placed in pre-op holding.  After the risks, benefits and alternatives were thoroughly discussed, the patient was brought to the operative theatre.  Anesthesia was induced, and the patient was prepped and draped in normal sterile fashion.  An appropriate surgical pause was performed, and pre-operative antibiotics were dosed accordingly.  We began with simultaneous incisions along the right leg for harvesting of the greater saphenous vein and the chest for the sternotomy.  In regards to the sternotomy, this was carried  down with bovie cautery, and the sternum was divided with a reciprocating saw.  Meticulous hemostasis was obtained.  The left internal thoracic artery was exposed and harvested in in pedicled fashion.  The patient was systemically heparinized, and the artery was divided distally, and placed in a papaverine sponge.    The sternal elevator was removed, and a retractor was placed.  The pericardium was divided in the midline and fashioned into a cradle with pericardial stitches.   After we confirmed an appropriate ACT, the ascending aorta was cannulated in standard fashion.  The right atrial appendage was used for venous cannulation site.  Cardiopulmonary bypass was initiated, and the heart retractor was placed. The cross clamp was applied, and a dose of anterograde cardioplegia was given with good arrest of the heart.  We moved to the posterior wall of the heart, and found a good target on the PDA.  An arteriotomy was made, and the vein graft was anastomosed to it in an end to side fashion.  Next we exposed the lateral wall, and found a good target on the OM2.  An end to side anastomosis with the vein graft was then created.  Next, we exposed the anterior wall of the heart and identified a  good target on D1.   An arteriotomy was created.  The vein was anastomosed in an end to side fashion.  Finally, we exposed a good target on the LAD, and fashioned an end to side anastomosis between it and the LITA.  We began to re-warm, and a re-animation dose of cardioplegia was given.  The heart was de-aired, and the cross clamp was removed.  Meticulous hemostasis was obtained.    A partial occludding clamp was then placed on the ascending aorta, and we created an end to side anastomosis between it and the proximal vein grafts.  The proximal sites were marked with rings.  Hemostasis was obtained, and we separated from cardiopulmonary bypass without event.the heparin was reversed with protamine.  Chest tubes and wires were  placed, and the sternum was re-approximated with with sternal wires.  The soft tissue and skin were re-approximated wth absorbable suture.    The patient tolerated the procedure without any immediate complications, and was transferred to the ICU in guarded condition.  Rosiland Sen Keane Scrape

## 2020-06-19 NOTE — Brief Op Note (Addendum)
06/14/2020 - 06/19/2020  11:18 AM  PATIENT:  Larry May  74 y.o. male  PRE-OPERATIVE DIAGNOSIS:  Coronary artery disease  POST-OPERATIVE DIAGNOSIS:  Coronary artery disease  PROCEDURE: TRANSESOPHAGEAL ECHOCARDIOGRAM (TEE), CORONARY ARTERY BYPASS GRAFTING (CABG) times 4 (LIMA to LAD, SVG to DIAGONAL 1, SVG to OM2, SVG to PDA) using left internal mammary artery and right greater saphenous vein harvested endoscopically  SVG HARVEST TIME: 49 minutes; SVG PREP TIME: 15 minutes  SURGEON:  Surgeon(s) and Role:    Lightfoot, Eliezer Lofts, MD - Primary  PHYSICIAN ASSISTANT: Doree Fudge PA-C   ANESTHESIA:   general  EBL:  Per anesthesia and perfusion record  DRAINS: Chest tubes placed in the mediastinal and pleural spaces   COUNTS CORRECT:  YES   DICTATION: .Dragon Dictation  PLAN OF CARE: Admit to inpatient   PATIENT DISPOSITION:  ICU - intubated and hemodynamically stable.   Delay start of Pharmacological VTE agent (>24hrs) due to surgical blood loss or risk of bleeding: yes  BASELINE WEIGHT: 123.9 kg

## 2020-06-19 NOTE — Procedures (Signed)
Extubation Procedure Note  Patient Details:   Name: Larry May DOB: 12-19-1945 MRN: [id number]   Airway Documentation:    Vent end date: 06/19/20 Vent end time: 2150   Evaluation  O2 sats: stable throughout Complications: No apparent complications Patient did tolerate procedure well. Bilateral Breath Sounds: Diminished, Rhonchi, Clear   Yes, pt able to cough to clear secretions and vocalize name and DOB. Pt had NIF of -40 and greater and VC of 1L pt positive for cuff leak prior to extubation and placed on 4L humidified nasal cannula and tolerating well at this time. Pt had IS of 650 and greater several times.   Tacy Learn 06/19/2020, 9:56 PM

## 2020-06-19 NOTE — Transfer of Care (Signed)
Immediate Anesthesia Transfer of Care Note  Patient: Larry May  Procedure(s) Performed: CORONARY ARTERY BYPASS GRAFTING (CABG) times four , using left internal mammary artery and right leg greater saphenous vein harvested endoscopically (N/A Chest) TRANSESOPHAGEAL ECHOCARDIOGRAM (TEE) (N/A )  Patient Location: SICU  Anesthesia Type:General  Level of Consciousness: sedated and Patient remains intubated per anesthesia plan  Airway & Oxygen Therapy: Patient remains intubated per anesthesia plan and Patient placed on Ventilator (see vital sign flow sheet for setting)  Post-op Assessment: Report given to RN, Post -op Vital signs reviewed and stable and Patient moving all extremities  Post vital signs: Reviewed and stable  Last Vitals:  Vitals Value Taken Time  BP 94/69 06/19/20 1308  Temp    Pulse 75 06/19/20 1312  Resp 15 06/19/20 1312  SpO2 93 % 06/19/20 1312  Vitals shown include unvalidated device data.  Last Pain:  Vitals:   06/18/20 1953  TempSrc: Oral  PainSc: 0-No pain      Patients Stated Pain Goal: 0 (06/14/20 2000)  Complications: No complications documented.

## 2020-06-19 NOTE — Progress Notes (Signed)
Rapid wean protocol started at this time 

## 2020-06-19 NOTE — Progress Notes (Signed)
Pt sent to PACU in NAD. Report given to CRNA. Belongings sent with patient including clothing, cellphone, and glasses.

## 2020-06-19 NOTE — Discharge Summary (Signed)
Physician Discharge Summary       [address]       [name] [address]             [phone]    Patient ID: Larry May MRN: [id number] DOB/AGE: Jul 29, 1946 74 y.o.  Admit date: 06/14/2020 Discharge date: 06/25/2020  Admission Diagnoses: 1.  NSTEMI (non-ST elevated myocardial infarction) (HCC) 2. Coronary artery disease  Discharge Diagnoses:  1. S/P CABG x 4 2. Expected post op blood loss anemia 3. History of diabetes mellitus (HCC) 4. History of hyperlipidemia 5. History of hypertension  Consults: None  Procedure (s):  CABG X 4.  LIMA LAD, RSVG PDA, OM2, D1   Endoscopic greater saphenous vein harvest on the right Intra-operative Transesophageal Echocardiogram by Dr. Cliffton Asters on 06/19/2020.  History of Presenting Illness: This is a 74 year old male with a past medical history of hypertension, diabetes mellitus, and hyperlipidemia who initially presented to [facility] on 11/02 with chest pain. The patient reports he has had multiple episodes of pain between the scapula since January 2021. He noticed it tended to occur after eating "cheesy meals" and thought it might be his gallbladder. Associated with this at times was nausea. He has low back pain and sees a Land. The pain between his shoulder blades seemed to "go away" after his adjustment.;however, it returned at 11 pm so he went to a local Urgent Care. He was advised to go to [facility] ED for further evaluation as there was a concern for ACS. The patient states he was tired of waiting in the ED (EKG and labs were done) so he returned home to "feed his animals and watch tv". He ultimately returned to [facility] ED and was instructed he would be admitted.   EKG in ED showed non specific T wave changes. Troponin I (high sensitivity) initially was 0.93 and increased to a maximum of 2.75. He was also noted to have elevated D dimer but CT angio showed no PE. His creatinine was 1.4 at [facility]. Echo done showed no significant valvular disease or pericardial effusion. He ultimately ruled in for a NSTEMI and was transferred to [facility] for further evaluation and treatment.   Dr. Cliffton Asters has been consulted for consideration of coronary artery bypass grafting surgery.  Pre operative carotid duplex showed no significant internal carotid artery stenosis bilaterally. Dr. Cliffton Asters discussed the need for coronary artery bypass grafting surgery. Potential risks, benefits and complications of the surgery were discussed with the patient and he agreed to proceed with surgery. He underwent a CABG x 4 on 06/19/2020 by Dr. Irving Copas.  Brief Hospital Course:  Patient was extubated later the evening of surgery without difficulty. He remained afebrile and hemodynamically stable. He was weaned off Neo synephrine. Theone Murdoch, a line, and foley were removed early in the post op course. Chest tube output decreased and then were removed on 11/10. Patient was started on Midodrine. He was volume overloaded and diuresed. His creatinine increased post op up to 1.6. Over time, it gradually decreased. Of note, his creatinine upon admission was 1.49. He remained in sinus rhythm. He was weaned off the Insulin drip. Metformin was not started at discharge due to elevated creatinine.  He has been started on Jardiance and will need close observation as an outpatient for his diabetes/metabolic syndrome.Marland Kitchen His HGA1C was 6.7. He had expected post op blood loss anemia. He did not require a post op transfusion. His last H and H was 8.3/25.5.  On chest x ray post op day 2, he was found to have a left apical pneumothorax;this did decrease and he did not require a chest tube. He was felt surgically stable for transfer from the ICU to 4E for further convalescence on 11/11. He initially required 2 liters of oxygen via [place]. He was later ambulating on room air with good oxygenation. His wounds are healing without sign of infection. He  did not have any pacing wires placed during surgery.  He is in sinus rhythm with some PACs.  Metoprolol has been uptitrated.  He did have some postoperative volume overload but has diuresed well and does not appear to need Lasix at the time of discharge as his creatinine is slightly up from baseline to 1.7.  He has been tolerating a diet and has had a bowel movement. He is felt surgically stable for discharge.   Latest Vital Signs: Blood pressure 135/74, pulse 75, temperature 98.6 F (37 C), temperature source Oral, resp. rate 20, height 6\' 2"  (1.88 m), weight 126.8 kg, SpO2 96 %.  Physical Exam General appearance: alert, cooperative and no distress Heart: regular rate and rhythm Lungs: clear to auscultation bilaterally Abdomen: benign Extremities: trace edema Wound: incis healing well Discharge Condition: Stable and discharge to home.  Recent laboratory studies:  Lab Results  Component Value Date   WBC 6.8 06/23/2020   HGB 8.3 (L) 06/23/2020   HCT 25.5 (L) 06/23/2020   MCV 87.6 06/23/2020   PLT 144 (L) 06/23/2020   Lab Results  Component Value Date   NA 141 06/25/2020   K 3.9 06/25/2020   CL 106 06/25/2020   CO2 23 06/25/2020   CREATININE 1.70 (H) 06/25/2020   GLUCOSE 114 (H) 06/25/2020      Diagnostic Studies: DG Chest 2 View  Result Date: 06/16/2020 CLINICAL DATA:  Preoperative for bypass surgery EXAM: CHEST - 2 VIEW COMPARISON:  None. FINDINGS: Normal heart size. Atherosclerotic mildly tortuous thoracic aorta. Otherwise normal mediastinal contour. No pneumothorax. No pleural effusion. Lungs appear clear, with no acute consolidative airspace disease and no pulmonary edema. IMPRESSION: No active cardiopulmonary disease. Electronically Signed   By: Delbert Phenix M.D.   On: 06/16/2020 10:29   CARDIAC CATHETERIZATION  Result Date: 06/14/2020  Mid RCA to Dist RCA lesion is 80% stenosed.  Dist RCA lesion is 40% stenosed.  RPDA lesion is 99% stenosed.  1st Mrg lesion is 50%  stenosed.  2nd Mrg lesion is 99% stenosed.  Dist Cx lesion is 100% stenosed.  Ost Cx lesion is 80% stenosed.  Prox LAD lesion is 70% stenosed.  1st Diag-1 lesion is 70% stenosed.  1st Diag-2 lesion is 60% stenosed.  Dist LAD lesion is 40% stenosed.  Severe three vessel CAD Moderately severe, eccentric mid LAD stenosis involving the moderate to large caliber diagonal branch which also has severe ostial/proximal disease. The Circumflex is a very large caliber vessel (6mm). There is severe ostial stenosis. The first OM branch has severe disease. The distal AV groove Circumflex is occluded and fills from right to left collaterals. The RCA is a large dominant vessel with severe mid stenosis in the bend of the vessel and severe PDA stenosis. Recommendations: He has severe three vessel CAD. The LAD lesion is moderately severe and involves the ostium of a moderate to large caliber diagonal branch. The Diagonal branch also has severe proximal disease. This would be high risk for PCI. The ostial Circumflex is a very large vessel (6 mm) with severe ostial stenosis  and not favorable for PCI. The large dominant RCA has several segments of severe disease. -I think CABG is the best revascularization option. I will ask CT surgery to see him to discuss CABG. -Repeat echo here at [facility]. He had an echo at [place] [facility] but it was a poor study per report and the images are not available to review. -Continue ASA, statin and beta blocker. -Sliding scale insulin coverage. -Restart IV heparin 8 hours post sheath pull.   DG Chest Port 1 View  Result Date: 06/22/2020 CLINICAL DATA:  Pneumothorax. EXAM: PORTABLE CHEST 1 VIEW COMPARISON:  June 21, 2020. FINDINGS: Stable cardiomediastinal silhouette. Status post coronary bypass graft. Small left apical pneumothorax is noted which is significantly decreased compared to prior exam. Bilateral supraclavicular subcutaneous emphysema is noted. Bibasilar subsegmental atelectasis is noted.  Left-sided chest tube is no longer visualized. Small left pleural effusion may be present. Bony thorax is unremarkable. IMPRESSION: Small left apical pneumothorax is noted which is significantly decreased compared to prior exam. Left-sided chest tube is no longer visualized. Bibasilar subsegmental atelectasis is noted. Bilateral supraclavicular subcutaneous emphysema is noted. Electronically Signed   By: Lupita Raider M.D.   On: 06/22/2020 09:09   DG Chest Port 1 View  Result Date: 06/21/2020 CLINICAL DATA:  Chest post coronary bypass graft EXAM: PORTABLE CHEST 1 VIEW COMPARISON:  06/20/2020 FINDINGS: Cardiac shadow is stable. Postsurgical changes are again seen. Right jugular sheath and catheter are noted extending into the superior vena cava. Previously seen left-sided pneumothorax is again identified and stable. Left chest tube is again noted and stable. Mild bibasilar atelectasis is noted. IMPRESSION: New bibasilar atelectasis. Stable left pneumothorax.  Left chest tube remains in place. Electronically Signed   By: Alcide Clever M.D.   On: 06/21/2020 08:10   DG Chest Port 1 View  Result Date: 06/20/2020 CLINICAL DATA:  Open-heart surgery with sore chest EXAM: PORTABLE CHEST 1 VIEW COMPARISON:  Yesterday FINDINGS: Extubation with stable atelectatic type opacity. CABG and cardiomegaly. Right IJ line with tip at the SVC. New left apical pneumothorax which is small to moderate, measuring 5.2 cm in thickness. Left chest tube is in place. IMPRESSION: 1. New left apical pneumothorax with basal chest tube in place. 2. Stable atelectasis after extubation. Electronically Signed   By: Marnee Spring M.D.   On: 06/20/2020 07:40   DG Chest Port 1 View  Result Date: 06/19/2020 CLINICAL DATA:  Orogastric tube placement. EXAM: PORTABLE CHEST 1 VIEW COMPARISON:  June 18, 2020. FINDINGS: Stable cardiomediastinal silhouette. Status post coronary bypass graft. Endotracheal and nasogastric tubes appear to be in  grossly good position. Right internal jugular catheter is unchanged. Right lung is clear. No definite pneumothorax is noted. Mild left basilar atelectasis is noted with associated pleural effusion. Bony thorax is unremarkable. IMPRESSION: Endotracheal and nasogastric tubes in grossly good position. Mild left basilar atelectasis is noted with associated pleural effusion. No definite pneumothorax is noted. Electronically Signed   By: Lupita Raider M.D.   On: 06/19/2020 14:44   DG Chest Port 1 View  Result Date: 06/18/2020 CLINICAL DATA:  Coronary artery disease, preoperative evaluation for bypass surgery EXAM: PORTABLE CHEST 1 VIEW COMPARISON:  06/16/2020 FINDINGS: The heart size and mediastinal contours are within normal limits. Both lungs are clear. The visualized skeletal structures are unremarkable. IMPRESSION: No active disease. Electronically Signed   By: Sharlet Salina M.D.   On: 06/18/2020 23:11   ECHOCARDIOGRAM COMPLETE  Result Date: 06/15/2020    ECHOCARDIOGRAM REPORT  Patient Name:   CORYDON SCHWEISS Date of Exam: 06/15/2020 Medical Rec #:  [id number]        Height:       74.0 in Accession #:    [id number]       Weight:       268.1 lb Date of Birth:  [birth year]        BSA:          2.462 m Patient Age:    74 years         BP:           109/80 mmHg Patient Gender: M                HR:           66 bpm. Exam Location:  Inpatient Procedure: 2D Echo Indications:    CAD Native Vessel I25.10  History:        Patient has no prior history of Echocardiogram examinations.                 Risk Factors:Hypertension, Diabetes and Dyslipidemia.  Sonographer:    Thurman Coyer RDCS (AE) Referring Phys: [id number] CHRISTOPHER D MCALHANY IMPRESSIONS  1. Left ventricular ejection fraction, by estimation, is 55 to 60%. The left ventricle has normal function. The left ventricle has no regional wall motion abnormalities. There is mild concentric left ventricular hypertrophy. Left ventricular diastolic parameters are  consistent with Grade I diastolic dysfunction (impaired relaxation).  2. Right ventricular systolic function is normal. The right ventricular size is normal. There is normal pulmonary artery systolic pressure.  3. The mitral valve is normal in structure. Trivial mitral valve regurgitation. No evidence of mitral stenosis.  4. The aortic valve is normal in structure. Aortic valve regurgitation is not visualized. No aortic stenosis is present.  5. The inferior vena cava is normal in size with greater than 50% respiratory variability, suggesting right atrial pressure of 3 mmHg. FINDINGS  Left Ventricle: Left ventricular ejection fraction, by estimation, is 55 to 60%. The left ventricle has normal function. The left ventricle has no regional wall motion abnormalities. The left ventricular internal cavity size was normal in size. There is  mild concentric left ventricular hypertrophy. Left ventricular diastolic parameters are consistent with Grade I diastolic dysfunction (impaired relaxation). Normal left ventricular filling pressure. Right Ventricle: The right ventricular size is normal. No increase in right ventricular wall thickness. Right ventricular systolic function is normal. There is normal pulmonary artery systolic pressure. Left Atrium: Left atrial size was normal in size. Right Atrium: Right atrial size was normal in size. Pericardium: There is no evidence of pericardial effusion. Mitral Valve: The mitral valve is normal in structure. Trivial mitral valve regurgitation. No evidence of mitral valve stenosis. Tricuspid Valve: The tricuspid valve is normal in structure. Tricuspid valve regurgitation is mild . No evidence of tricuspid stenosis. Aortic Valve: The aortic valve is normal in structure. Aortic valve regurgitation is not visualized. No aortic stenosis is present. Pulmonic Valve: The pulmonic valve was normal in structure. Pulmonic valve regurgitation is not visualized. No evidence of pulmonic stenosis.  Aorta: The aortic root is normal in size and structure. Venous: The inferior vena cava is normal in size with greater than 50% respiratory variability, suggesting right atrial pressure of 3 mmHg. IAS/Shunts: No atrial level shunt detected by color flow Doppler.  LEFT VENTRICLE PLAX 2D LVIDd:         5.06 cm  Diastology LVIDs:  3.70 cm  LV e' medial:    5.70 cm/s LV PW:         1.38 cm  LV E/e' medial:  11.9 LV IVS:        1.20 cm  LV e' lateral:   8.59 cm/s LVOT diam:     2.30 cm  LV E/e' lateral: 7.9 LV SV:         76 LV SV Index:   31 LVOT Area:     4.15 cm  RIGHT VENTRICLE RV S prime:     10.60 cm/s TAPSE (M-mode): 1.6 cm LEFT ATRIUM           Index       RIGHT ATRIUM           Index LA diam:      3.50 cm 1.42 cm/m  RA Area:     22.10 cm LA Vol (A2C): 37.3 ml 15.15 ml/m RA Volume:   66.80 ml  27.14 ml/m LA Vol (A4C): 51.7 ml 21.00 ml/m  AORTIC VALVE LVOT Vmax:   84.20 cm/s LVOT Vmean:  53.800 cm/s LVOT VTI:    0.184 m  AORTA Ao Root diam: 3.50 cm MITRAL VALVE MV Area (PHT): 2.50 cm    SHUNTS MV Decel Time: 303 msec    Systemic VTI:  0.18 m MV E velocity: 67.70 cm/s  Systemic Diam: 2.30 cm MV A velocity: 69.80 cm/s MV E/A ratio:  0.97 Tobias Alexander MD Electronically signed by Tobias Alexander MD Signature Date/Time: 06/15/2020/3:16:14 PM    Final    ECHO INTRAOPERATIVE TEE  Result Date: 06/19/2020  *INTRAOPERATIVE TRANSESOPHAGEAL REPORT *  Patient Name:   CINQUE BEGLEY Date of Exam: 06/19/2020 Medical Rec #:  [id number]        Height:       74.0 in Accession #:    [id number]       Weight:       273.1 lb Date of Birth:  [birth year]        BSA:          2.48 m Patient Age:    74 years         BP:           118/56 mmHg Patient Gender: M                HR:           55 bpm. Exam Location:  Anesthesiology Transesophogeal exam was perform intraoperatively during surgical procedure. Patient was closely monitored under general anesthesia during the entirety of examination. Indications:     CAD Native  Vessel i25.0 Sonographer:     Irving Burton Senior RDCS Performing Phys: [id number] Gwenith Daily FUXNATFT Diagnosing Phys: Marcene Duos MD Complications: No known complications during this procedure. POST-OP IMPRESSIONS - Left Ventricle: The left ventricle is unchanged from pre-bypass. - Right Ventricle: The right ventricle appears unchanged from pre-bypass. - Aorta: The aorta appears unchanged from pre-bypass. - Left Atrium: The left atrium appears unchanged from pre-bypass. - Left Atrial Appendage: The left atrial appendage appears unchanged from pre-bypass. - Aortic Valve: The aortic valve appears unchanged from pre-bypass. - Mitral Valve: The mitral valve appears unchanged from pre-bypass. - Tricuspid Valve: The tricuspid valve appears unchanged from pre-bypass. - Interatrial Septum: The interatrial septum appears unchanged from pre-bypass. - Interventricular Septum: The interventricular septum appears unchanged from pre-bypass. - Pericardium: The pericardium appears unchanged from pre-bypass. PRE-OP FINDINGS  Left Ventricle: The left ventricle has low normal systolic  function, with an ejection fraction of 50-55%. The cavity size was normal. There is mildly increased left ventricular wall thickness. Right Ventricle: The right ventricle has normal systolic function. The cavity was normal. There is no increase in right ventricular wall thickness. Left Atrium: Left atrial size was normal in size. Right Atrium: Right atrial size was normal in size. Interatrial Septum: No atrial level shunt detected by color flow Doppler. Pericardium: There is no evidence of pericardial effusion. Mitral Valve: The mitral valve is normal in structure. Moderate thickening of the mitral valve leaflet. Mitral valve regurgitation is trivial by color flow Doppler. There is No evidence of mitral stenosis. Tricuspid Valve: The tricuspid valve was normal in structure. Tricuspid valve regurgitation was not visualized by color flow Doppler. The tricuspid  valve is mildly thickened. Aortic Valve: The aortic valve is tricuspid There is mild thickening of the aortic valve Aortic valve regurgitation was not visualized by color flow Doppler. There is no stenosis of the aortic valve. Pulmonic Valve: The pulmonic valve was normal in structure. Pulmonic valve regurgitation is not visualized by color flow Doppler. Aorta: The aortic root, ascending aorta and aortic arch are normal in size and structure. +--------------+--------++ LEFT VENTRICLE         +--------------+--------++ PLAX 2D                +--------------+--------++ LVOT diam:    2.30 cm  +--------------+--------++ LVOT Area:    4.15 cm +--------------+--------++                        +--------------+--------++ +-------------+-----------++ AORTIC VALVE             +-------------+-----------++ AV Vmax:     125.00 cm/s +-------------+-----------++ AV Vmean:    89.500 cm/s +-------------+-----------++ AV VTI:      0.264 m     +-------------+-----------++ AV Peak Grad:6.2 mmHg    +-------------+-----------++ AV Mean Grad:4.0 mmHg    +-------------+-----------++  +--------------+-------+ SHUNTS                +--------------+-------+ Systemic Diam:2.30 cm +--------------+-------+  Marcene Duos MD Electronically signed by Marcene Duos MD Signature Date/Time: 06/19/2020/1:40:18 PM    Final    VAS US DOPPLER PRE CABG  Result Date: 06/15/2020 PREOPERATIVE VASCULAR EVALUATION  Indications:  Pre-CABG. Risk Factors: Hypertension, hyperlipidemia, Diabetes. Performing Technologist: Blanch Media RVS  Examination Guidelines: A complete evaluation includes B-mode imaging, spectral Doppler, color Doppler, and power Doppler as needed of all accessible portions of each vessel. Bilateral testing is considered an integral part of a complete examination. Limited examinations for reoccurring indications may be performed as noted.  Right Carotid Findings:  +----------+--------+--------+--------+------------+--------+           PSV cm/sEDV cm/sStenosisDescribe    Comments +----------+--------+--------+--------+------------+--------+ CCA Prox  98      17              heterogenous         +----------+--------+--------+--------+------------+--------+ CCA Distal78      14              heterogenous         +----------+--------+--------+--------+------------+--------+ ICA Prox  52      19      1-39%   heterogenous         +----------+--------+--------+--------+------------+--------+ ICA Distal43      9                                    +----------+--------+--------+--------+------------+--------+  ECA       112                                          +----------+--------+--------+--------+------------+--------+ Portions of this table do not appear on this page. +----------+--------+-------+--------+------------+           PSV cm/sEDV cmsDescribeArm Pressure +----------+--------+-------+--------+------------+ Subclavian90                                  +----------+--------+-------+--------+------------+ +---------+--------+--------+--------------+ VertebralPSV cm/sEDV cm/sNot identified +---------+--------+--------+--------------+ Left Carotid Findings: +----------+--------+--------+--------+------------+--------+           PSV cm/sEDV cm/sStenosisDescribe    Comments +----------+--------+--------+--------+------------+--------+ CCA Prox  84      15              heterogenous         +----------+--------+--------+--------+------------+--------+ CCA Distal62      9               heterogenous         +----------+--------+--------+--------+------------+--------+ ICA Prox  53      17      1-39%   heterogenous         +----------+--------+--------+--------+------------+--------+ ICA Distal39      12                                    +----------+--------+--------+--------+------------+--------+ ECA       70                                           +----------+--------+--------+--------+------------+--------+ +----------+--------+--------+--------+------------+ SubclavianPSV cm/sEDV cm/sDescribeArm Pressure +----------+--------+--------+--------+------------+           96                                   +----------+--------+--------+--------+------------+ +---------+--------+--+--------+--+---------+ VertebralPSV cm/s31EDV cm/s12Antegrade +---------+--------+--+--------+--+---------+  ABI Findings: +--------+------------------+-----+---------+--------+ Right   Rt Pressure (mmHg)IndexWaveform Comment  +--------+------------------+-----+---------+--------+ Brachial                       triphasic         +--------+------------------+-----+---------+--------+ ATA                            triphasic         +--------+------------------+-----+---------+--------+ PTA                            triphasic         +--------+------------------+-----+---------+--------+ +--------+------------------+-----+---------+-------+ Left    Lt Pressure (mmHg)IndexWaveform Comment +--------+------------------+-----+---------+-------+ [id number]                    triphasic        +--------+------------------+-----+---------+-------+ ATA                            triphasic        +--------+------------------+-----+---------+-------+ PTA  triphasic        +--------+------------------+-----+---------+-------+  Right Doppler Findings: +--------+--------+-----+---------+--------+ Site    PressureIndexDoppler  Comments +--------+--------+-----+---------+--------+ Brachial             triphasic         +--------+--------+-----+---------+--------+ Radial               triphasic         +--------+--------+-----+---------+--------+ Ulnar                 triphasic         +--------+--------+-----+---------+--------+  Left Doppler Findings: +--------+--------+-----+---------+--------+ Site    PressureIndexDoppler  Comments +--------+--------+-----+---------+--------+ [id number]          triphasic         +--------+--------+-----+---------+--------+ Radial               triphasic         +--------+--------+-----+---------+--------+ Ulnar                triphasic         +--------+--------+-----+---------+--------+  Summary: Right Carotid: Velocities in the right ICA are consistent with a 1-39% stenosis. Left Carotid: Velocities in the left ICA are consistent with a 1-39% stenosis. Vertebrals: Left vertebral artery demonstrates antegrade flow. Right vertebral             artery was not visualized. Right Upper Extremity: Doppler waveform obliterate with right radial compression. Doppler waveforms remain within normal limits with right ulnar compression. Left Upper Extremity: Doppler waveforms decrease >50% with left radial compression. Doppler waveforms remain within normal limits with left ulnar compression.  Electronically signed by Heath Larkhomas Hawken on 06/15/2020 at 7:07:35 PM.    Final    Discharge Instructions    Amb Referral to Cardiac Rehabilitation   Complete by: As directed    Referring to [place] CRP 2   Diagnosis:  CABG NSTEMI     CABG X ___: 4   After initial evaluation and assessments completed: Virtual Based Care may be provided alone or in conjunction with Phase 2 Cardiac Rehab based on patient barriers.: Yes   Discharge patient   Complete by: As directed    Discharge disposition: 01-Home or Self Care   Discharge patient date: 06/25/2020      Discharge Medications: Allergies as of 06/25/2020   No Known Allergies     Medication List    STOP taking these medications   lisinopril 20 MG tablet Commonly known as: ZESTRIL   metFORMIN 1000 MG tablet Commonly known as: GLUCOPHAGE     spironolactone-hydrochlorothiazide 25-25 MG tablet Commonly known as: ALDACTAZIDE     TAKE these medications   aspirin 325 MG EC tablet Take 1 tablet (325 mg total) by mouth daily.   atorvastatin 80 MG tablet Commonly known as: LIPITOR Take 1 tablet (80 mg total) by mouth daily.   empagliflozin 10 MG Tabs tablet Commonly known as: JARDIANCE Take 1 tablet (10 mg total) by mouth daily.   Metoprolol Tartrate 37.5 MG Tabs Take 37.5 mg by mouth 2 (two) times daily.   traMADol 50 MG tablet Commonly known as: ULTRAM Take 1 tablet (50 mg total) by mouth every 6 (six) hours as needed for up to 7 days for moderate pain.      The patient has been discharged on:   1.Beta Blocker:  Yes [place].Etienne[y   ]  No   [   ]                              If No, reason:  2.Ace Inhibitor/ARB: Yes [   ]                                     No  [  n  ]                                     If No, reason:renal insuff  3.Statin:   Yes [ y  ]                  No  [   ]                  If No, reason:  4.Ecasa:  Yes  [ y  ]                  No   [   ]                  If No, reason:  Follow Up Appointments:  Follow-up Information    Baldo Daub, MD. Go on 07/12/2020.   Specialty: Cardiology Why: Appointment is at 10:00 am Contact information: [address] [place] [address] [phone]        Corliss Skains, MD. Go on 07/14/2020.   Specialty: Cardiothoracic Surgery Why: Appointment is at 9:45 am Contact information: [address] [place] [address] [phone]        Street, [name], MD. Call in 1 day(s).   Specialty: Family Medicine Contact information: [address] [place] [address] [phone]               Signed: Noel Christmas 06/25/2020, 8:44 AM

## 2020-06-19 NOTE — Anesthesia Postprocedure Evaluation (Signed)
Anesthesia Post Note  Patient: TIMMOTHY BARANOWSKI  Procedure(s) Performed: CORONARY ARTERY BYPASS GRAFTING (CABG) times four , using left internal mammary artery and right leg greater saphenous vein harvested endoscopically (N/A Chest) TRANSESOPHAGEAL ECHOCARDIOGRAM (TEE) (N/A )     Patient location during evaluation: SICU Anesthesia Type: General Level of consciousness: sedated Pain management: pain level controlled Vital Signs Assessment: post-procedure vital signs reviewed and stable Respiratory status: patient remains intubated per anesthesia plan Cardiovascular status: stable Postop Assessment: no apparent nausea or vomiting Anesthetic complications: no   No complications documented.  Last Vitals:  Vitals:   06/19/20 1345 06/19/20 1400  BP: 97/60 122/76  Pulse: 73 74  Resp: 12 12  Temp:    SpO2: 97% 100%    Last Pain:  Vitals:   06/18/20 1953  TempSrc: Oral  PainSc: 0-No pain                 Kennieth Rad

## 2020-06-19 NOTE — Discharge Instructions (Signed)

## 2020-06-19 NOTE — Progress Notes (Signed)
Echocardiogram Echocardiogram Transesophageal has been performed.  Czar Ysaguirre F Panzy Bubeck 06/19/2020, 1:07 PM 

## 2020-06-19 NOTE — Anesthesia Procedure Notes (Signed)
Procedure Name: Intubation Date/Time: 06/19/2020 7:58 AM Performed by: Leonor Liv, CRNA Pre-anesthesia Checklist: Patient identified, Emergency Drugs available, Suction available and Patient being monitored Patient Re-evaluated:Patient Re-evaluated prior to induction Oxygen Delivery Method: Circle System Utilized Preoxygenation: Pre-oxygenation with 100% oxygen Induction Type: IV induction Ventilation: Mask ventilation without difficulty Laryngoscope Size: Mac and 4 Grade View: Grade II Tube type: Oral Tube size: 8.0 mm Number of attempts: 1 Airway Equipment and Method: Stylet and Oral airway Placement Confirmation: ETT inserted through vocal cords under direct vision,  positive ETCO2 and breath sounds checked- equal and bilateral Secured at: 23 cm Tube secured with: Tape Dental Injury: Teeth and Oropharynx as per pre-operative assessment  Comments: Poor dentition

## 2020-06-20 ENCOUNTER — Inpatient Hospital Stay (HOSPITAL_COMMUNITY): Payer: Medicare HMO

## 2020-06-20 ENCOUNTER — Encounter (HOSPITAL_COMMUNITY): Payer: Self-pay | Admitting: Thoracic Surgery (Cardiothoracic Vascular Surgery)

## 2020-06-20 LAB — BASIC METABOLIC PANEL
Anion gap: 8 (ref 5–15)
Anion gap: 6 (ref 5–15)
BUN: 19 mg/dL (ref 8–23)
BUN: 22 mg/dL (ref 8–23)
CO2: 20 mmol/L — ABNORMAL LOW (ref 22–32)
CO2: 25 mmol/L (ref 22–32)
Calcium: 7.2 mg/dL — ABNORMAL LOW (ref 8.9–10.3)
Calcium: 8 mg/dL — ABNORMAL LOW (ref 8.9–10.3)
Chloride: 110 mmol/L (ref 98–111)
Chloride: 105 mmol/L (ref 98–111)
Creatinine, Ser: 1.2 mg/dL (ref 0.61–1.24)
Creatinine, Ser: 1.59 mg/dL — ABNORMAL HIGH (ref 0.61–1.24)
GFR, Estimated: >60 mL/min (ref >60)
GFR, Estimated: 45 mL/min — ABNORMAL LOW (ref >60)
Glucose, Bld: 131 mg/dL — ABNORMAL HIGH (ref 70–99)
Glucose, Bld: 228 mg/dL — ABNORMAL HIGH (ref 70–99)
Potassium: 3.7 mmol/L (ref 3.5–5.1)
Potassium: 4.5 mmol/L (ref 3.5–5.1)
Sodium: 138 mmol/L (ref 135–145)
Sodium: 136 mmol/L (ref 135–145)

## 2020-06-20 LAB — POCT I-STAT 7, (LYTES, BLD GAS, ICA,H+H)
Acid-base deficit: 7 mmol/L — ABNORMAL HIGH (ref 0.0–2.0)
Acid-base deficit: 6 mmol/L — ABNORMAL HIGH (ref 0.0–2.0)
Acid-base deficit: 1 mmol/L (ref 0.0–2.0)
Acid-base deficit: 1 mmol/L (ref 0.0–2.0)
Bicarbonate: 18.2 mmol/L — ABNORMAL LOW (ref 20.0–28.0)
Bicarbonate: 18.8 mmol/L — ABNORMAL LOW (ref 20.0–28.0)
Bicarbonate: 24.1 mmol/L (ref 20.0–28.0)
Bicarbonate: 23.9 mmol/L (ref 20.0–28.0)
Calcium, Ion: 1.08 mmol/L — ABNORMAL LOW (ref 1.15–1.40)
Calcium, Ion: 1.11 mmol/L — ABNORMAL LOW (ref 1.15–1.40)
Calcium, Ion: 1.17 mmol/L (ref 1.15–1.40)
Calcium, Ion: 1.16 mmol/L (ref 1.15–1.40)
HCT: 26 % — ABNORMAL LOW (ref 39.0–52.0)
HCT: 27 % — ABNORMAL LOW (ref 39.0–52.0)
HCT: 30 % — ABNORMAL LOW (ref 39.0–52.0)
HCT: 30 % — ABNORMAL LOW (ref 39.0–52.0)
Hemoglobin: 8.8 g/dL — ABNORMAL LOW (ref 13.0–17.0)
Hemoglobin: 9.2 g/dL — ABNORMAL LOW (ref 13.0–17.0)
Hemoglobin: 10.2 g/dL — ABNORMAL LOW (ref 13.0–17.0)
Hemoglobin: 10.2 g/dL — ABNORMAL LOW (ref 13.0–17.0)
O2 Saturation: 96 %
O2 Saturation: 94 %
O2 Saturation: 100 %
O2 Saturation: 98 %
Patient temperature: 37.2
Patient temperature: 37.8
Patient temperature: 98.6
Patient temperature: 98.6
Potassium: 3.2 mmol/L — ABNORMAL LOW (ref 3.5–5.1)
Potassium: 4.9 mmol/L (ref 3.5–5.1)
Potassium: 4.6 mmol/L (ref 3.5–5.1)
Potassium: 4.5 mmol/L (ref 3.5–5.1)
Sodium: 144 mmol/L (ref 135–145)
Sodium: 140 mmol/L (ref 135–145)
Sodium: 140 mmol/L (ref 135–145)
Sodium: 140 mmol/L (ref 135–145)
TCO2: 19 mmol/L — ABNORMAL LOW (ref 22–32)
TCO2: 20 mmol/L — ABNORMAL LOW (ref 22–32)
TCO2: 25 mmol/L (ref 22–32)
TCO2: 25 mmol/L (ref 22–32)
pCO2 arterial: 36 mmHg (ref 32.0–48.0)
pCO2 arterial: 34.7 mmHg (ref 32.0–48.0)
pCO2 arterial: 39.1 mmHg (ref 32.0–48.0)
pCO2 arterial: 39.7 mmHg (ref 32.0–48.0)
pH, Arterial: 7.314 — ABNORMAL LOW (ref 7.350–7.450)
pH, Arterial: 7.346 — ABNORMAL LOW (ref 7.350–7.450)
pH, Arterial: 7.397 (ref 7.350–7.450)
pH, Arterial: 7.387 (ref 7.350–7.450)
pO2, Arterial: 91 mmHg (ref 83.0–108.0)
pO2, Arterial: 76 mmHg — ABNORMAL LOW (ref 83.0–108.0)
pO2, Arterial: 182 mmHg — ABNORMAL HIGH (ref 83.0–108.0)
pO2, Arterial: 98 mmHg (ref 83.0–108.0)

## 2020-06-20 LAB — GLUCOSE, CAPILLARY
Glucose-Capillary: 100 mg/dL — ABNORMAL HIGH (ref 70–99)
Glucose-Capillary: 121 mg/dL — ABNORMAL HIGH (ref 70–99)
Glucose-Capillary: 122 mg/dL — ABNORMAL HIGH (ref 70–99)
Glucose-Capillary: 122 mg/dL — ABNORMAL HIGH (ref 70–99)
Glucose-Capillary: 134 mg/dL — ABNORMAL HIGH (ref 70–99)
Glucose-Capillary: 136 mg/dL — ABNORMAL HIGH (ref 70–99)
Glucose-Capillary: 146 mg/dL — ABNORMAL HIGH (ref 70–99)
Glucose-Capillary: 190 mg/dL — ABNORMAL HIGH (ref 70–99)
Glucose-Capillary: 192 mg/dL — ABNORMAL HIGH (ref 70–99)
Glucose-Capillary: 196 mg/dL — ABNORMAL HIGH (ref 70–99)
Glucose-Capillary: 203 mg/dL — ABNORMAL HIGH (ref 70–99)

## 2020-06-20 LAB — CBC
HCT: 28.1 % — ABNORMAL LOW (ref 39.0–52.0)
HCT: 27.1 % — ABNORMAL LOW (ref 39.0–52.0)
Hemoglobin: 9.2 g/dL — ABNORMAL LOW (ref 13.0–17.0)
Hemoglobin: 8.9 g/dL — ABNORMAL LOW (ref 13.0–17.0)
MCH: 28.5 pg (ref 26.0–34.0)
MCH: 29 pg (ref 26.0–34.0)
MCHC: 32.7 g/dL (ref 30.0–36.0)
MCHC: 32.8 g/dL (ref 30.0–36.0)
MCV: 87 fL (ref 80.0–100.0)
MCV: 88.3 fL (ref 80.0–100.0)
Platelets: 120 10*3/uL — ABNORMAL LOW (ref 150–400)
Platelets: 120 10*3/uL — ABNORMAL LOW (ref 150–400)
RBC: 3.23 MIL/uL — ABNORMAL LOW (ref 4.22–5.81)
RBC: 3.07 MIL/uL — ABNORMAL LOW (ref 4.22–5.81)
RDW: 13.3 % (ref 11.5–15.5)
RDW: 13.7 % (ref 11.5–15.5)
WBC: 9.6 10*3/uL (ref 4.0–10.5)
WBC: 10.3 10*3/uL (ref 4.0–10.5)
nRBC: 0 % (ref 0.0–0.2)
nRBC: 0 % (ref 0.0–0.2)

## 2020-06-20 LAB — MAGNESIUM
Magnesium: 2.1 mg/dL (ref 1.7–2.4)
Magnesium: 2.3 mg/dL (ref 1.7–2.4)

## 2020-06-20 MED ORDER — ALBUMIN HUMAN 5 % IV SOLN
25.0000 g | Freq: Once | INTRAVENOUS | Status: AC
  Administered 2020-06-20: 25 g via INTRAVENOUS
  Filled 2020-06-20: qty 500

## 2020-06-20 MED ORDER — INSULIN DETEMIR 100 UNIT/ML ~~LOC~~ SOLN
10.0000 [IU] | Freq: Every day | SUBCUTANEOUS | Status: DC
  Administered 2020-06-20 – 2020-06-22 (×3): 10 [IU] via SUBCUTANEOUS
  Filled 2020-06-20 (×4): qty 0.1

## 2020-06-20 MED ORDER — INSULIN ASPART 100 UNIT/ML ~~LOC~~ SOLN
0.0000 [IU] | SUBCUTANEOUS | Status: DC
  Administered 2020-06-20 (×3): 4 [IU] via SUBCUTANEOUS
  Administered 2020-06-21: 8 [IU] via SUBCUTANEOUS
  Administered 2020-06-21: 2 [IU] via SUBCUTANEOUS
  Administered 2020-06-21: 8 [IU] via SUBCUTANEOUS
  Administered 2020-06-21 (×2): 4 [IU] via SUBCUTANEOUS
  Administered 2020-06-21: 2 [IU] via SUBCUTANEOUS
  Administered 2020-06-22: 8 [IU] via SUBCUTANEOUS
  Administered 2020-06-22 (×3): 2 [IU] via SUBCUTANEOUS

## 2020-06-20 MED ORDER — INSULIN DETEMIR 100 UNIT/ML ~~LOC~~ SOLN: 10.0000 [IU] | Freq: Every day | SUBCUTANEOUS | Status: DC

## 2020-06-20 MED ORDER — POTASSIUM CHLORIDE CRYS ER 20 MEQ PO TBCR
20.0000 meq | EXTENDED_RELEASE_TABLET | ORAL | Status: AC
  Administered 2020-06-20 (×3): 20 meq via ORAL
  Filled 2020-06-20 (×3): qty 1

## 2020-06-20 MED ORDER — SODIUM CHLORIDE 0.9 % IV SOLN
1.5000 g | Freq: Two times a day (BID) | INTRAVENOUS | Status: AC
  Administered 2020-06-20 – 2020-06-21 (×3): 1.5 g via INTRAVENOUS
  Filled 2020-06-20 (×3): qty 1.5

## 2020-06-20 MED ORDER — MIDODRINE HCL 5 MG PO TABS
5.0000 mg | ORAL_TABLET | Freq: Three times a day (TID) | ORAL | Status: DC
  Administered 2020-06-20 – 2020-06-25 (×13): 5 mg via ORAL
  Filled 2020-06-20 (×13): qty 1

## 2020-06-20 MED ORDER — ENOXAPARIN SODIUM 30 MG/0.3ML ~~LOC~~ SOLN
30.0000 mg | Freq: Every day | SUBCUTANEOUS | Status: DC
  Administered 2020-06-20 – 2020-06-24 (×5): 30 mg via SUBCUTANEOUS
  Filled 2020-06-20 (×5): qty 0.3

## 2020-06-20 MED FILL — Potassium Chloride Inj 2 mEq/ML: INTRAVENOUS | Qty: 40 | Status: AC

## 2020-06-20 MED FILL — Heparin Sodium (Porcine) Inj 1000 Unit/ML: INTRAMUSCULAR | Qty: 30 | Status: AC

## 2020-06-20 MED FILL — Magnesium Sulfate Inj 50%: INTRAMUSCULAR | Qty: 10 | Status: AC

## 2020-06-20 NOTE — Progress Notes (Signed)
      [address]       [place] [address]             [phone]                 1 Day Post-Op Procedure(s) (LRB): CORONARY ARTERY BYPASS GRAFTING (CABG) times four , using left internal mammary artery and right leg greater saphenous vein harvested endoscopically (N/A) TRANSESOPHAGEAL ECHOCARDIOGRAM (TEE) (N/A)   Events: No events _______________________________________________________________ Vitals: BP 100/61   Pulse 76   Temp 100.2 F (37.9 C) (Core)   Resp 20   Ht 6\' 2"  (1.88 m)   Wt 130.7 kg   SpO2 96%   BMI 37.00 kg/m   - Neuro: alert NAD  - Cardiovascular: sinus  Drips: neo 35.   CVP:  [2 mmHg-21 mmHg] 12 mmHg  - Pulm: EWOB SS CT output ABG    Component Value Date/Time   PHART 7.385 06/19/2020 1208   PCO2ART 39.0 06/19/2020 1208   PO2ART 171 (H) 06/19/2020 1208   HCO3 23.3 06/19/2020 1208   TCO2 21 (L) 06/19/2020 1212   ACIDBASEDEF 2.0 06/19/2020 1208   O2SAT 100.0 06/19/2020 1208    - Abd: soft - Extremity: warm  .Intake/Output      11/08 0701 - 11/09 0700 11/09 0701 - 11/10 0700   P.O.     I.V. (mL/kg) 3210.3 (24.6)    Blood 237    IV Piggyback 1496.5    Total Intake(mL/kg) 4943.8 (37.8)    Urine (mL/kg/hr) 2765 (0.9)    Emesis/NG output 0    Chest Tube 670    Total Output 3435    Net +1508.8            _______________________________________________________________ Labs: CBC Latest Ref Rng & Units 06/20/2020 06/19/2020 06/19/2020  WBC 4.0 - 10.5 K/uL 9.6 10.7(H) 9.5  Hemoglobin 13.0 - 17.0 g/dL 13/03/2020) 10.6(L) 10.1(L)  Hematocrit 39 - 52 % 28.1(L) 31.8(L) 30.9(L)  Platelets 150 - 400 K/uL 120(L) 142(L) 94(L)   CMP Latest Ref Rng & Units 06/20/2020 06/19/2020 06/19/2020  Glucose 70 - 99 mg/dL 13/03/2020) 093(A) 355(D)  BUN 8 - 23 mg/dL 19 22 22   Creatinine 0.61 - 1.24 mg/dL 322(G ) 2.54  Sodium 135 - 145 mmol/L 138 133(L) 140  Potassium 3.5 - 5.1 mmol/L 3.7 5.2(H) 4.1  Chloride 98 - 111 mmol/L 110 107 106  CO2 22 -  32 mmol/L 20(L) 20(L) -  Calcium 8.9 - 10.3 mg/dL 7.2(L) 7.9(L) -  Total Protein 6.5 - 8.1 g/dL - - -  Total Bilirubin 0.3 - 1.2 mg/dL - - -  Alkaline Phos 38 - 126 U/L - - -  AST 15 - 41 U/L - - -  ALT 0 - 44 U/L - - -    CXR: L effusion  _______________________________________________________________  Assessment and Plan: POD 1 s/p CABG doing well  Neuro: pain controlled CV: on neo.  Low SVR.  Will give 2.70(W of albumin.  Will keep lines until off neo Pulm: continue pulm toilet Renal: creat coming down.  Will remove foley GI: advancing diet Heme: stable ID: afebrile Endo: SSI Dispo: continue ICU care  2.37, MD 06/20/2020 7:53 AM

## 2020-06-20 NOTE — Progress Notes (Signed)
Patient ID: [place] May, male   DOB: Jan 14, 1946, 74 y.o.   MRN: [id number] TCTS Evening Rounds:  Hemodynamically stable in sinus rhythm.  Weaning neo off now that midodrine started.  Urine output was ok prior to foley removal. Bump in creat today.  BMET    Component Value Date/Time   NA 136 06/20/2020 1643   K 4.5 06/20/2020 1643   CL 105 06/20/2020 1643   CO2 25 06/20/2020 1643   GLUCOSE 228 (H) 06/20/2020 1643   BUN 22 06/20/2020 1643   CREATININE 1.59 (H) 06/20/2020 1643   CALCIUM 8.0 (L) 06/20/2020 1643   GFRNONAA 45 (L) 06/20/2020 1643   CBC    Component Value Date/Time   WBC 10.3 06/20/2020 1643   RBC 3.07 (L) 06/20/2020 1643   HGB 8.9 (L) 06/20/2020 1643   HCT 27.1 (L) 06/20/2020 1643   PLT 120 (L) 06/20/2020 1643   MCV 88.3 06/20/2020 1643   MCH 29.0 06/20/2020 1643   MCHC 32.8 06/20/2020 1643   RDW 13.7 06/20/2020 1643

## 2020-06-20 NOTE — Care Management (Signed)
Benefit's check placed with TOC administrator to check the cost for Jardiance 10 mg per day for 30 day supply versus Farxiga 10 mg per day.  Note will be placed for cost.

## 2020-06-20 NOTE — TOC Benefit Eligibility Note (Signed)
Transition of Care [facility]) Benefit Eligibility Note    Patient Details  Name: ONUR MORI MRN: [id number] Date of Birth: February 08, 1946   Medication/Dose: JARDIANCE  10 MG DAILY   and    FARXIGA  10 MG DAILY  Covered?: Yes  Tier: 3 Drug  Prescription Coverage [facility]erred [facility]: [facility]  and  [facility]  Spoke with Person/Company/Phone Number:: [facility]  @   [employer] [place] # [phone]  Co-Pay: $ 47.00 FOR EACH PRESCRIPTION  Prior Approval: No  Deductible: Met       Memory Argue Phone Number: 06/20/2020, 4:19 PM

## 2020-06-21 ENCOUNTER — Inpatient Hospital Stay (HOSPITAL_COMMUNITY): Payer: Medicare HMO

## 2020-06-21 LAB — CBC
HCT: 26.4 % — ABNORMAL LOW (ref 39.0–52.0)
Hemoglobin: 8.7 g/dL — ABNORMAL LOW (ref 13.0–17.0)
MCH: 29.6 pg (ref 26.0–34.0)
MCHC: 33 g/dL (ref 30.0–36.0)
MCV: 89.8 fL (ref 80.0–100.0)
Platelets: 103 10*3/uL — ABNORMAL LOW (ref 150–400)
RBC: 2.94 MIL/uL — ABNORMAL LOW (ref 4.22–5.81)
RDW: 13.8 % (ref 11.5–15.5)
WBC: 7.6 10*3/uL (ref 4.0–10.5)
nRBC: 0 % (ref 0.0–0.2)

## 2020-06-21 LAB — GLUCOSE, CAPILLARY
Glucose-Capillary: 192 mg/dL — ABNORMAL HIGH (ref 70–99)
Glucose-Capillary: 162 mg/dL — ABNORMAL HIGH (ref 70–99)
Glucose-Capillary: 225 mg/dL — ABNORMAL HIGH (ref 70–99)
Glucose-Capillary: 150 mg/dL — ABNORMAL HIGH (ref 70–99)
Glucose-Capillary: 155 mg/dL — ABNORMAL HIGH (ref 70–99)

## 2020-06-21 LAB — BASIC METABOLIC PANEL
Anion gap: 7 (ref 5–15)
BUN: 23 mg/dL (ref 8–23)
CO2: 23 mmol/L (ref 22–32)
Calcium: 8.1 mg/dL — ABNORMAL LOW (ref 8.9–10.3)
Chloride: 106 mmol/L (ref 98–111)
Creatinine, Ser: 1.6 mg/dL — ABNORMAL HIGH (ref 0.61–1.24)
GFR, Estimated: 44 mL/min — ABNORMAL LOW (ref >60)
Glucose, Bld: 182 mg/dL — ABNORMAL HIGH (ref 70–99)
Potassium: 4.3 mmol/L (ref 3.5–5.1)
Sodium: 136 mmol/L (ref 135–145)

## 2020-06-21 NOTE — Progress Notes (Signed)
      [address]       [place] [address]             [phone]                 2 Days Post-Op Procedure(s) (LRB): CORONARY ARTERY BYPASS GRAFTING (CABG) times four , using left internal mammary artery and right leg greater saphenous vein harvested endoscopically (N/A) TRANSESOPHAGEAL ECHOCARDIOGRAM (TEE) (N/A)   Events: No events _______________________________________________________________ Vitals: BP 110/64   Pulse 80   Temp 99.6 F (37.6 C) (Oral)   Resp (!) 28   Ht 6\' 2"  (1.88 m)   Wt 131.3 kg   SpO2 91%   BMI 37.16 kg/m   - Neuro: alert NAD  - Cardiovascular: sinus  Drips: none  CVP:  [2 mmHg-5 mmHg] 5 mmHg  - Pulm: EWOB SS CT output ABG    Component Value Date/Time   PHART 7.387 06/19/2020 2305   PCO2ART 39.7 06/19/2020 2305   PO2ART 98 06/19/2020 2305   HCO3 23.9 06/19/2020 2305   TCO2 25 06/19/2020 2305   ACIDBASEDEF 1.0 06/19/2020 2305   O2SAT 98.0 06/19/2020 2305    - Abd: soft - Extremity: warm  .Intake/Output      11/09 0701 - 11/10 0700 11/10 0701 - 11/11 0700   P.O. 960 240   I.V. (mL/kg) 1102.3 (8.4) 70.1 (0.5)   Blood     IV Piggyback 199.9 100.1   Total Intake(mL/kg) 2262.2 (17.2) 410.2 (3.1)   Urine (mL/kg/hr) 710 (0.2)    Emesis/NG output     Chest Tube 260 100   Total Output 970 100   Net +1292.2 +310.2           _______________________________________________________________ Labs: CBC Latest Ref Rng & Units 06/21/2020 06/20/2020 06/20/2020  WBC 4.0 - 10.5 K/uL 7.6 10.3 9.6  Hemoglobin 13.0 - 17.0 g/dL 13/04/2020) 8.9(L) 9.2(L)  Hematocrit 39 - 52 % 26.4(L) 27.1(L) 28.1(L)  Platelets 150 - 400 K/uL 103(L) 120(L) 120(L)   CMP Latest Ref Rng & Units 06/21/2020 06/20/2020 06/20/2020  Glucose 70 - 99 mg/dL 13/04/2020) 102(H) 852(D)  BUN 8 - 23 mg/dL 23 22 19   Creatinine 0.61 - 1.24 mg/dL 782(U) ) 2.35(T  Sodium 135 - 145 mmol/L 136 136 138  Potassium 3.5 - 5.1 mmol/L 4.3 4.5 3.7  Chloride 98 - 111 mmol/L 106  105 110  CO2 22 - 32 mmol/L 23 25 20(L)  Calcium 8.9 - 10.3 mg/dL 8.1(L) 8.0(L) 7.2(L)  Total Protein 6.5 - 8.1 g/dL - - -  Total Bilirubin 0.3 - 1.2 mg/dL - - -  Alkaline Phos 38 - 126 U/L - - -  AST 15 - 41 U/L - - -  ALT 0 - 44 U/L - - -    CXR: L effusion  _______________________________________________________________  Assessment and Plan: POD 2 s/p CABG doing well  Neuro: pain controlled CV:on a/s/bb Pulm: continue pulm toilet.  L pneumothorax stable Renal: creat up.  Will continue to follow GI: advancing diet Heme: stable ID: afebrile Endo: SSI Dispo: continue ICU care for AKI  6.14(E, MD 06/21/2020 1:18 PM

## 2020-06-21 NOTE — Progress Notes (Signed)
Patient ID: Larry May, male   DOB: 09/18/1945, 74 y.o.   MRN: [id number] EVENING ROUNDS NOTE :     [address]       [name] [address]             [phone]                 2 Days Post-Op Procedure(s) (LRB): CORONARY ARTERY BYPASS GRAFTING (CABG) times four , using left internal mammary artery and right leg greater saphenous vein harvested endoscopically (N/A) TRANSESOPHAGEAL ECHOCARDIOGRAM (TEE) (N/A)  Total Length of Stay:  LOS: 7 days  BP 110/64   Pulse 80   Temp 99.2 F (37.3 C) (Oral)   Resp (!) 28   Ht 6\' 2"  (1.88 m)   Wt 131.3 kg   SpO2 91%   BMI 37.16 kg/m   .Intake/Output      11/09 0701 - 11/10 0700 11/10 0701 - 11/11 0700   P.O. 960 240   I.V. (mL/kg) 1102.3 (8.4) 70.1 (0.5)   Blood     IV Piggyback 199.9 100.1   Total Intake(mL/kg) 2262.2 (17.2) 410.2 (3.1)   Urine (mL/kg/hr) 710 (0.2)    Emesis/NG output     Chest Tube 260 100   Total Output 970 100   Net +1292.2 +310.2          . sodium chloride Stopped (06/21/20 0949)  . sodium chloride 250 mL (06/20/20 0623)  . sodium chloride Stopped (06/19/20 1315)  . insulin Stopped (06/20/20 1214)  . lactated ringers Stopped (06/19/20 1354)  . lactated ringers 10 mL/hr at 06/21/20 1100  . phenylephrine (NEO-SYNEPHRINE) Adult infusion Stopped (06/21/20 0002)     Lab Results  Component Value Date   WBC 7.6 06/21/2020   HGB 8.7 (L) 06/21/2020   HCT 26.4 (L) 06/21/2020   PLT 103 (L) 06/21/2020   GLUCOSE 182 (H) 06/21/2020   CHOL 120 06/17/2020   TRIG 96 06/17/2020   HDL 35 (L) 06/17/2020   LDLCALC 66 06/17/2020   ALT 31 06/19/2020   AST 29 06/19/2020   NA 136 06/21/2020   K 4.3 06/21/2020   CL 106 06/21/2020   CREATININE 1.60 (H) 06/21/2020   BUN 23 06/21/2020   CO2 23 06/21/2020   INR 1.3 (H) 06/19/2020   HGBA1C 6.7 (H) 06/19/2020   Cr 1.6  Walking in unit  Stable day   13/03/2020 MD  Beeper [phone] Office [phone] 06/21/2020 4:00 PM

## 2020-06-22 ENCOUNTER — Inpatient Hospital Stay (HOSPITAL_COMMUNITY): Payer: Medicare HMO

## 2020-06-22 LAB — BASIC METABOLIC PANEL
Anion gap: 7 (ref 5–15)
BUN: 22 mg/dL (ref 8–23)
CO2: 24 mmol/L (ref 22–32)
Calcium: 8.3 mg/dL — ABNORMAL LOW (ref 8.9–10.3)
Chloride: 109 mmol/L (ref 98–111)
Creatinine, Ser: 1.56 mg/dL — ABNORMAL HIGH (ref 0.61–1.24)
GFR, Estimated: 46 mL/min — ABNORMAL LOW (ref >60)
Glucose, Bld: 139 mg/dL — ABNORMAL HIGH (ref 70–99)
Potassium: 4.2 mmol/L (ref 3.5–5.1)
Sodium: 140 mmol/L (ref 135–145)

## 2020-06-22 LAB — GLUCOSE, CAPILLARY
Glucose-Capillary: 136 mg/dL — ABNORMAL HIGH (ref 70–99)
Glucose-Capillary: 150 mg/dL — ABNORMAL HIGH (ref 70–99)
Glucose-Capillary: 153 mg/dL — ABNORMAL HIGH (ref 70–99)
Glucose-Capillary: 172 mg/dL — ABNORMAL HIGH (ref 70–99)
Glucose-Capillary: 210 mg/dL — ABNORMAL HIGH (ref 70–99)

## 2020-06-22 MED ORDER — INSULIN ASPART 100 UNIT/ML ~~LOC~~ SOLN: 0.0000 [IU] | Freq: Three times a day (TID) | SUBCUTANEOUS | Status: DC

## 2020-06-22 MED ORDER — POTASSIUM CHLORIDE CRYS ER 20 MEQ PO TBCR
40.0000 meq | EXTENDED_RELEASE_TABLET | Freq: Every day | ORAL | Status: DC
  Administered 2020-06-22 – 2020-06-25 (×4): 40 meq via ORAL
  Filled 2020-06-22 (×3): qty 2

## 2020-06-22 MED ORDER — INSULIN ASPART 100 UNIT/ML ~~LOC~~ SOLN
0.0000 [IU] | Freq: Three times a day (TID) | SUBCUTANEOUS | Status: DC
  Administered 2020-06-22: 4 [IU] via SUBCUTANEOUS
  Administered 2020-06-23: 8 [IU] via SUBCUTANEOUS
  Administered 2020-06-23: 4 [IU] via SUBCUTANEOUS
  Administered 2020-06-23: 2 [IU] via SUBCUTANEOUS
  Administered 2020-06-23: 8 [IU] via SUBCUTANEOUS
  Administered 2020-06-24: 4 [IU] via SUBCUTANEOUS
  Administered 2020-06-24 (×2): 8 [IU] via SUBCUTANEOUS
  Administered 2020-06-24 – 2020-06-25 (×2): 2 [IU] via SUBCUTANEOUS

## 2020-06-22 MED ORDER — SODIUM CHLORIDE 0.9% FLUSH: 3.0000 mL | INTRAVENOUS | Status: DC | PRN

## 2020-06-22 MED ORDER — ~~LOC~~ CARDIAC SURGERY, PATIENT & FAMILY EDUCATION: Freq: Once | Status: DC

## 2020-06-22 MED ORDER — FUROSEMIDE 40 MG PO TABS
40.0000 mg | ORAL_TABLET | Freq: Every day | ORAL | Status: DC
  Administered 2020-06-22 – 2020-06-25 (×4): 40 mg via ORAL
  Filled 2020-06-22: qty 1
  Filled 2020-06-22: qty 2
  Filled 2020-06-22 (×2): qty 1

## 2020-06-22 MED ORDER — INSULIN ASPART 100 UNIT/ML ~~LOC~~ SOLN: 0.0000 [IU] | SUBCUTANEOUS | Status: DC

## 2020-06-22 MED ORDER — SODIUM CHLORIDE 0.9% FLUSH
3.0000 mL | Freq: Two times a day (BID) | INTRAVENOUS | Status: DC
  Administered 2020-06-22 – 2020-06-25 (×4): 3 mL via INTRAVENOUS

## 2020-06-22 MED ORDER — SODIUM CHLORIDE 0.9 % IV SOLN: 250.0000 mL | INTRAVENOUS | Status: DC | PRN

## 2020-06-22 MED FILL — Electrolyte-R (PH 7.4) Solution: INTRAVENOUS | Qty: 5000 | Status: AC

## 2020-06-22 MED FILL — Calcium Chloride Inj 10%: INTRAVENOUS | Qty: 10 | Status: AC

## 2020-06-22 MED FILL — Albumin, Human Inj 5%: INTRAVENOUS | Qty: 250 | Status: AC

## 2020-06-22 MED FILL — Mannitol IV Soln 20%: INTRAVENOUS | Qty: 500 | Status: AC

## 2020-06-22 MED FILL — Sodium Chloride IV Soln 0.9%: INTRAVENOUS | Qty: 2000 | Status: AC

## 2020-06-22 MED FILL — Heparin Sodium (Porcine) Inj 1000 Unit/ML: INTRAMUSCULAR | Qty: 10 | Status: CN

## 2020-06-22 MED FILL — Heparin Sodium (Porcine) Inj 1000 Unit/ML: INTRAMUSCULAR | Qty: 30 | Status: AC

## 2020-06-22 MED FILL — Sodium Bicarbonate IV Soln 8.4%: INTRAVENOUS | Qty: 50 | Status: AC

## 2020-06-22 NOTE — Progress Notes (Signed)
[phone] Came to see to offer walk. Pt stated he has walked twice already and going to walk a third time at 4 pm. Encouraged IS in addition to walks. Will follow up tomorrow. Luetta Nutting RN BSN 06/22/2020 2:48 PM

## 2020-06-22 NOTE — Plan of Care (Signed)

## 2020-06-22 NOTE — Progress Notes (Signed)
Pt received to room in wheelchair, assisted to recliner without difficulty.  CHG performed, VS taken, monitor placed and CCMD notified.  Pt alert cooperative, oriented x 4, no pain.  Lunch ordered.  Will cont plan of care.

## 2020-06-22 NOTE — Progress Notes (Signed)
      [address]       [place] [address]             [phone]                 3 Days Post-Op Procedure(s) (LRB): CORONARY ARTERY BYPASS GRAFTING (CABG) times four , using left internal mammary artery and right leg greater saphenous vein harvested endoscopically (N/A) TRANSESOPHAGEAL ECHOCARDIOGRAM (TEE) (N/A)   Events: No events _______________________________________________________________ Vitals: BP 124/71   Pulse 82   Temp 98.9 F (37.2 C) (Oral)   Resp (!) 31   Ht 6\' 2"  (1.88 m)   Wt 131.4 kg   SpO2 96%   BMI 37.19 kg/m   - Neuro: alert NAD  - Cardiovascular: sinus  Drips: none     - Pulm: EWOB SS CT output ABG    Component Value Date/Time   PHART 7.387 06/19/2020 2305   PCO2ART 39.7 06/19/2020 2305   PO2ART 98 06/19/2020 2305   HCO3 23.9 06/19/2020 2305   TCO2 25 06/19/2020 2305   ACIDBASEDEF 1.0 06/19/2020 2305   O2SAT 98.0 06/19/2020 2305    - Abd: soft - Extremity: warm  .Intake/Output      11/10 0701 - 11/11 0700 11/11 0701 - 11/12 0700   P.O. 240    I.V. (mL/kg) 70.1 (0.5)    IV Piggyback 100.1    Total Intake(mL/kg) 410.2 (3.1)    Urine (mL/kg/hr) 675 (0.2)    Chest Tube 100    Total Output 775    Net -364.9         Urine Occurrence 1 x       _______________________________________________________________ Labs: CBC Latest Ref Rng & Units 06/21/2020 06/20/2020 06/20/2020  WBC 4.0 - 10.5 K/uL 7.6 10.3 9.6  Hemoglobin 13.0 - 17.0 g/dL 13/04/2020) 8.9(L) 9.2(L)  Hematocrit 39 - 52 % 26.4(L) 27.1(L) 28.1(L)  Platelets 150 - 400 K/uL 103(L) 120(L) 120(L)   CMP Latest Ref Rng & Units 06/22/2020 06/21/2020 06/20/2020  Glucose 70 - 99 mg/dL 13/04/2020) 500(X) 381(W)  BUN 8 - 23 mg/dL 22 23 22   Creatinine 0.61 - 1.24 mg/dL 299(B) ) 7.16(R)  Sodium 135 - 145 mmol/L 140 136 136  Potassium 3.5 - 5.1 mmol/L 4.2 4.3 4.5  Chloride 98 - 111 mmol/L 109 106 105  CO2 22 - 32 mmol/L 24 23 25   Calcium 8.9 - 10.3 mg/dL 8.3(L)  8.1(L) 8.0(L)  Total Protein 6.5 - 8.1 g/dL - - -  Total Bilirubin 0.3 - 1.2 mg/dL - - -  Alkaline Phos 38 - 126 U/L - - -  AST 15 - 41 U/L - - -  ALT 0 - 44 U/L - - -    CXR: L pneumothorax improved  _______________________________________________________________  Assessment and Plan: POD 3 s/p CABG doing well  Neuro: pain controlled CV:on a/s/bb.  On midodrine for low BP Pulm: continue pulm toilet.  L pneumothorax improved Renal: creat stable.  Will diurese gently today GI: advancing diet Heme: stable ID: afebrile Endo: SSI Dispo: floor today  6.78(L, MD 06/22/2020 9:41 AM

## 2020-06-22 NOTE — Progress Notes (Signed)
Mobility Specialist - Progress Note   06/22/20 1620  Mobility  Activity Ambulated in hall  Level of Assistance Moderate assist, patient does 50-74%  Assistive Device Front wheel walker  Distance Ambulated (ft) 140 ft  Mobility Response Tolerated well  Mobility performed by Mobility specialist  $Mobility charge 1 Mobility    Pre-mobility: 82 HR, 97% SpO2 During mobility: 90 HR, 97% SpO2 Post-mobility: 79 HR, 97% SpO2  Pt mod assist w/ verbal cues in order to rock-to-stand from chair. Contact guard for safety while ambulating. Pt required instruction on how to properly use his RW as he initially tried walking w/ it while holding it in front of him, perpendicular to his body. He ambulated w/ a slow gait, stating he felt fatigued from walking earlier today.   Mamie Levers Mobility Specialist Mobility Specialist Phone: [phone]

## 2020-06-23 LAB — BASIC METABOLIC PANEL
Anion gap: 11 (ref 5–15)
BUN: 22 mg/dL (ref 8–23)
CO2: 22 mmol/L (ref 22–32)
Calcium: 8.5 mg/dL — ABNORMAL LOW (ref 8.9–10.3)
Chloride: 106 mmol/L (ref 98–111)
Creatinine, Ser: 1.55 mg/dL — ABNORMAL HIGH (ref 0.61–1.24)
GFR, Estimated: 46 mL/min — ABNORMAL LOW (ref >60)
Glucose, Bld: 144 mg/dL — ABNORMAL HIGH (ref 70–99)
Potassium: 4 mmol/L (ref 3.5–5.1)
Sodium: 139 mmol/L (ref 135–145)

## 2020-06-23 LAB — CBC
HCT: 25.5 % — ABNORMAL LOW (ref 39.0–52.0)
Hemoglobin: 8.3 g/dL — ABNORMAL LOW (ref 13.0–17.0)
MCH: 28.5 pg (ref 26.0–34.0)
MCHC: 32.5 g/dL (ref 30.0–36.0)
MCV: 87.6 fL (ref 80.0–100.0)
Platelets: 144 10*3/uL — ABNORMAL LOW (ref 150–400)
RBC: 2.91 MIL/uL — ABNORMAL LOW (ref 4.22–5.81)
RDW: 13.8 % (ref 11.5–15.5)
WBC: 6.8 10*3/uL (ref 4.0–10.5)
nRBC: 0 % (ref 0.0–0.2)

## 2020-06-23 LAB — GLUCOSE, CAPILLARY
Glucose-Capillary: 150 mg/dL — ABNORMAL HIGH (ref 70–99)
Glucose-Capillary: 217 mg/dL — ABNORMAL HIGH (ref 70–99)
Glucose-Capillary: 170 mg/dL — ABNORMAL HIGH (ref 70–99)
Glucose-Capillary: 202 mg/dL — ABNORMAL HIGH (ref 70–99)

## 2020-06-23 MED ORDER — INSULIN DETEMIR 100 UNIT/ML ~~LOC~~ SOLN
12.0000 [IU] | Freq: Every day | SUBCUTANEOUS | Status: DC
  Administered 2020-06-23: 12 [IU] via SUBCUTANEOUS
  Filled 2020-06-23 (×2): qty 0.12

## 2020-06-23 NOTE — Progress Notes (Signed)
Mobility Specialist - Progress Note   06/23/20 1442  Mobility  Activity Ambulated in hall  Level of Assistance Minimal assist, patient does 75% or more  Assistive Device Front wheel walker  Distance Ambulated (ft) 140 ft  Mobility Response Tolerated well  Mobility performed by Mobility specialist  $Mobility charge 1 Mobility    Pre-mobility: 75 HR, 97% SpO2 During mobility: 90 HR, 99% SpO2 Post-mobility: 82 HR, 100% SpO2  Pt min assist to get in/out of bed, standby assist during ambulation. Asx throughout ambulation. Pt eager to walk once more today.   Mamie Levers Mobility Specialist Mobility Specialist Phone: [phone]

## 2020-06-23 NOTE — Plan of Care (Signed)
  Problem: Health Behavior/Discharge Planning: Goal: Ability to manage health-related needs will improve Outcome: Progressing   Problem: Clinical Measurements: Goal: Will remain free from infection Outcome: Progressing   

## 2020-06-23 NOTE — Progress Notes (Addendum)
Mobility Specialist - Progress Note   06/23/20 1710  Mobility  Activity Ambulated in hall  Level of Assistance Minimal assist, patient does 75% or more  Assistive Device Front wheel walker  Distance Ambulated (ft) 140 ft  Mobility Response Tolerated well  Mobility performed by Mobility specialist  $Mobility charge 1 Mobility    Pre-mobility: 105 HR, 135/59 BP, 98% SpO2 Post-mobility: 90 HR, 149/76 BP, 100% SpO2  Pt requesting to ambulate once more. Min assist for bed mobility, standby during ambulation. He c/o a dull, non-radiating, midsternal pain that he rated a 4/10.   Mamie Levers Mobility Specialist Mobility Specialist Phone: [phone]

## 2020-06-23 NOTE — Progress Notes (Signed)
CARDIAC REHAB PHASE I   PRE:  Rate/Rhythm: 83 SR  BP:  Supine: 106/58  Sitting:   Standing:    SaO2: 96%RA  MODE:  Ambulation: 140 ft   POST:  Rate/Rhythm: 103 ST  BP:  Supine:   Sitting: 148/56  Standing:    SaO2: 96%RA 1120-1210 Pt walked 140 ft on RA with rolling walker with fairly steady gait. Pt does not think he wants rolling walker for home use. To sitting on side of bed after walk. Encouraged IS and OOB. Reviewed sternal precautions, walking for exercise and wound care, IS. Discussed CRP 2 and referral letter to be sent to [facility]. To bathroom and pt instructed to call when finished. Notified NT.   Luetta Nutting, RN BSN  06/23/2020 12:05 PM

## 2020-06-23 NOTE — Progress Notes (Addendum)
      [address]       [place] [address]             [phone]      4 Days Post-Op Procedure(s) (LRB): CORONARY ARTERY BYPASS GRAFTING (CABG) times four , using left internal mammary artery and right leg greater saphenous vein harvested endoscopically (N/A) TRANSESOPHAGEAL ECHOCARDIOGRAM (TEE) (N/A) Subjective: Feels good this morning. No complaints. Did not sleep well last night.   Objective: Vital signs in last 24 hours: Temp:  [98.8 F (37.1 C)-99.3 F (37.4 C)] 98.9 F (37.2 C) (11/12 0418) Pulse Rate:  [73-86] 73 (11/12 0418) Cardiac Rhythm: Normal sinus rhythm (11/11 1900) Resp:  [18-31] 20 (11/12 0418) BP: (103-140)/(51-75) 103/63 (11/12 0418) SpO2:  [95 %-99 %] 97 % (11/12 0417) Weight:  [122.5 kg-122.8 kg] 122.8 kg (11/12 0430)     Intake/Output from previous day: 11/11 0701 - 11/12 0700 In: 360 [P.O.:360] Out: 800 [Urine:800] Intake/Output this shift: No intake/output data recorded.  General appearance: alert, cooperative and no distress Heart: regular rate and rhythm, S1, S2 normal, no murmur, click, rub or gallop Lungs: clear to auscultation bilaterally Abdomen: hyperactive bowel sounds Extremities: extremities normal, atraumatic, no cyanosis or edema Wound: clean and dry  Lab Results: Recent Labs    06/21/20 0246 06/23/20 0251  WBC 7.6 6.8  HGB 8.7* 8.3*  HCT 26.4* 25.5*  PLT 103* 144*   BMET:  Recent Labs    06/22/20 0229 06/23/20 0251  NA 140 139  K 4.2 4.0  CL 109 106  CO2 24 22  GLUCOSE 139* 144*  BUN 22 22  CREATININE 1.56* 1.55*  CALCIUM 8.3* 8.5*    PT/INR: No results for input(s): LABPROT, INR in the last 72 hours. ABG    Component Value Date/Time   PHART 7.387 06/19/2020 2305   HCO3 23.9 06/19/2020 2305   TCO2 25 06/19/2020 2305   ACIDBASEDEF 1.0 06/19/2020 2305   O2SAT 98.0 06/19/2020 2305   CBG (last 3)  Recent Labs    06/22/20 1658 06/22/20 2146 06/23/20 0637  GLUCAP 153* 172* 150*     Assessment/Plan: S/P Procedure(s) (LRB): CORONARY ARTERY BYPASS GRAFTING (CABG) times four , using left internal mammary artery and right leg greater saphenous vein harvested endoscopically (N/A) TRANSESOPHAGEAL ECHOCARDIOGRAM (TEE) (N/A)  1. CV-NSR in the 70s, BP well controlled. Continue asa, statin, metoprolol and midodrine 2. Pulm-tolerating 2L [place], good oxygen saturation. Last CXR stable 3. Renal-creatinine 1.55, stable.  Electrolytes okay. On lasix 40mg  oral daily 4. H and H 8.3/25.5, expected acute blood loss anemia 5. Endo-increase Levemir to 12 units daily, continue SSI.  6. Lovenox for DVT prophylaxis  Plan: Incision healing well. His renal function has remained stable. Continue to wean oxygen as able today. Ambulation around the unit.    LOS: 9 days    05-08-2005 06/23/2020   Chart reviewed, patient examined, agree with above. POD 4 His sats are 100% on 2L. He should wean from oxygen without difficulty. Encouraged to continue IS, ambulation. Anticipate home this weekend.

## 2020-06-24 LAB — BASIC METABOLIC PANEL
Anion gap: 7 (ref 5–15)
BUN: 21 mg/dL (ref 8–23)
CO2: 24 mmol/L (ref 22–32)
Calcium: 8.7 mg/dL — ABNORMAL LOW (ref 8.9–10.3)
Chloride: 109 mmol/L (ref 98–111)
Creatinine, Ser: 1.57 mg/dL — ABNORMAL HIGH (ref 0.61–1.24)
GFR, Estimated: 45 mL/min — ABNORMAL LOW (ref >60)
Glucose, Bld: 127 mg/dL — ABNORMAL HIGH (ref 70–99)
Potassium: 4 mmol/L (ref 3.5–5.1)
Sodium: 140 mmol/L (ref 135–145)

## 2020-06-24 LAB — GLUCOSE, CAPILLARY
Glucose-Capillary: 149 mg/dL — ABNORMAL HIGH (ref 70–99)
Glucose-Capillary: 206 mg/dL — ABNORMAL HIGH (ref 70–99)
Glucose-Capillary: 170 mg/dL — ABNORMAL HIGH (ref 70–99)
Glucose-Capillary: 222 mg/dL — ABNORMAL HIGH (ref 70–99)

## 2020-06-24 LAB — MAGNESIUM: Magnesium: 1.9 mg/dL (ref 1.7–2.4)

## 2020-06-24 MED ORDER — EMPAGLIFLOZIN 10 MG PO TABS
10.0000 mg | ORAL_TABLET | Freq: Every day | ORAL | Status: DC
  Administered 2020-06-24 – 2020-06-25 (×2): 10 mg via ORAL
  Filled 2020-06-24 (×2): qty 1

## 2020-06-24 MED ORDER — METOPROLOL TARTRATE 25 MG PO TABS
25.0000 mg | ORAL_TABLET | Freq: Two times a day (BID) | ORAL | Status: DC
  Administered 2020-06-24 – 2020-06-25 (×3): 25 mg via ORAL
  Filled 2020-06-24 (×3): qty 1

## 2020-06-24 NOTE — Progress Notes (Signed)
CARDIAC REHAB PHASE I   PRE:  Rate/Rhythm: 70 Afib  BP:  Sitting: 119/60      SaO2: 99 RA  MODE:  Ambulation: 200 ft   POST:  Rate/Rhythm: 102 Afib  BP:  Sitting: 149/70    SaO2: 93 RA   Pt ambulated 272ft in hallway standby assist with front wheel walker. Pt denies pain, SOB, or dizziness. Pt returned to bed. Call bell and bedside table within reach. Encouraged continued ambulation and IS use.  [id number] Reynold Bowen, RN BSN 06/24/2020 11:26 AM

## 2020-06-24 NOTE — Progress Notes (Addendum)
[address]       [place] [address]             [phone]      5 Days Post-Op Procedure(s) (LRB): CORONARY ARTERY BYPASS GRAFTING (CABG) times four , using left internal mammary artery and right leg greater saphenous vein harvested endoscopically (N/A) TRANSESOPHAGEAL ECHOCARDIOGRAM (TEE) (N/A) Subjective: Feels ok overall  Objective: Vital signs in last 24 hours: Temp:  [98 F (36.7 C)-99 F (37.2 C)] 98 F (36.7 C) (11/13 0318) Pulse Rate:  [69-88] 69 (11/13 0318) Cardiac Rhythm: Normal sinus rhythm (11/12 2100) Resp:  [19-24] 19 (11/13 0637) BP: (117-148)/(56-75) 122/75 (11/13 0318) SpO2:  [92 %-98 %] 92 % (11/13 0318) Weight:  [128.1 kg] 128.1 kg (11/13 0637)  Hemodynamic parameters for last 24 hours:    Intake/Output from previous day: 11/12 0701 - 11/13 0700 In: 940 [P.O.:840] Out: 1800 [Urine:1800] Intake/Output this shift: No intake/output data recorded.  General appearance: alert, cooperative and no distress Heart: regular rate and rhythm Lungs: clear to auscultation bilaterally Abdomen: benign Extremities: minor edema, some is chronic venous stasis changes Wound: incis healing well  Lab Results: Recent Labs    06/23/20 0251  WBC 6.8  HGB 8.3*  HCT 25.5*  PLT 144*   BMET:  Recent Labs    06/23/20 0251 06/24/20 0304  NA 139 140  K 4.0 4.0  CL 106 109  CO2 22 24  GLUCOSE 144* 127*  BUN 22 21  CREATININE 1.55* 1.57*  CALCIUM 8.5* 8.7*    PT/INR: No results for input(s): LABPROT, INR in the last 72 hours. ABG    Component Value Date/Time   PHART 7.387 06/19/2020 2305   HCO3 23.9 06/19/2020 2305   TCO2 25 06/19/2020 2305   ACIDBASEDEF 1.0 06/19/2020 2305   O2SAT 98.0 06/19/2020 2305   CBG (last 3)  Recent Labs    06/23/20 1646 06/23/20 2133 06/24/20 0618  GLUCAP 170* 202* 149*    Meds Scheduled Meds: . acetaminophen  1,000 mg Oral Q6H   Or  . acetaminophen (TYLENOL) oral liquid 160 mg/5 mL  1,000  mg Per Tube Q6H  . aspirin EC  325 mg Oral Daily   Or  . aspirin  324 mg Per Tube Daily  . atorvastatin  80 mg Oral Daily  . bisacodyl  10 mg Oral Daily   Or  . bisacodyl  10 mg Rectal Daily  . Chlorhexidine Gluconate Cloth  6 each Topical Daily  . [facility], Patient & Family Education   Does not apply Once  . docusate sodium  200 mg Oral Daily  . enoxaparin (LOVENOX) injection  30 mg Subcutaneous QHS  . furosemide  40 mg Oral Daily  . insulin aspart  0-24 Units Subcutaneous TID AC & HS  . insulin detemir  12 Units Subcutaneous Daily  . metoprolol tartrate  12.5 mg Oral BID  . midodrine  5 mg Oral TID WC  . pantoprazole  40 mg Oral Daily  . potassium chloride  40 mEq Oral Daily  . sodium chloride flush  3 mL Intravenous Q12H  . sodium chloride flush  3 mL Intravenous Q12H   Continuous Infusions: . sodium chloride Stopped (06/21/20 0949)  . sodium chloride 250 mL (06/20/20 0623)  . sodium chloride Stopped (06/19/20 1315)  . sodium chloride    . insulin Stopped (06/20/20 1214)  . lactated ringers Stopped (06/19/20 1354)   PRN Meds:.sodium chloride, sodium  chloride, dextrose, ondansetron (ZOFRAN) IV, oxyCODONE, sodium chloride flush, sodium chloride flush, traMADol  Xrays DG Chest Port 1 View  Result Date: 06/22/2020 CLINICAL DATA:  Pneumothorax. EXAM: PORTABLE CHEST 1 VIEW COMPARISON:  June 21, 2020. FINDINGS: Stable cardiomediastinal silhouette. Status post coronary bypass graft. Small left apical pneumothorax is noted which is significantly decreased compared to prior exam. Bilateral supraclavicular subcutaneous emphysema is noted. Bibasilar subsegmental atelectasis is noted. Left-sided chest tube is no longer visualized. Small left pleural effusion may be present. Bony thorax is unremarkable. IMPRESSION: Small left apical pneumothorax is noted which is significantly decreased compared to prior exam. Left-sided chest tube is no longer visualized. Bibasilar  subsegmental atelectasis is noted. Bilateral supraclavicular subcutaneous emphysema is noted. Electronically Signed   By: Lupita Raider M.D.   On: 06/22/2020 09:09    Assessment/Plan: S/P Procedure(s) (LRB): CORONARY ARTERY BYPASS GRAFTING (CABG) times four , using left internal mammary artery and right leg greater saphenous vein harvested endoscopically (N/A) TRANSESOPHAGEAL ECHOCARDIOGRAM (TEE) (N/A)  1 afeb, VSS, rare HTN reading, mostly well controlled, sinus rhythm with PAC's and? Short time of afib- will increase beta blocker a little . K+ is ok, will recheck MG++ 2 sats good on RA 3 creat is stable 4 BS control is fair- was on metformin at home, with new renal insuffic will trial jardiance. D.C insulin 5 wont restart ACE-I with renal insuf and BP pretty reasonable currently bt will need close f/u  6 cont rehab/pulm toilet  LOS: 10 days    Rowe Clack PA-C Pager [phone] 06/24/2020 Patient seen and examined, agree with above Increase beta blocker Home in AM if A fib resolves  Viviann Spare C. Dorris Fetch, MD [facility] [phone]

## 2020-06-24 NOTE — Progress Notes (Signed)
CARDIAC REHAB PHASE I   Offered to walk with pt. Pt requesting to walk around 1030. Will return and encourage ambulation as able.  Reynold Bowen, RN BSN 06/24/2020 [phone]

## 2020-06-24 NOTE — Progress Notes (Signed)
Mobility Specialist: Progress Note   06/24/20 1508  Mobility  Activity Ambulated in hall  Level of Assistance Independent  Assistive Device Front wheel walker  Distance Ambulated (ft) 280 ft  Mobility Response Tolerated well  Mobility performed by Mobility specialist  Bed Position Chair  $Mobility charge 1 Mobility   Pre-Mobility: 76 HR, 120/63 BP, 95% SpO2 Post-Mobility: 85 HR, 147/75 BP, 97% SpO2  Pt was asx during ambulation.   [facility] Shey Bartmess Mobility Specialist

## 2020-06-25 ENCOUNTER — Other Ambulatory Visit: Payer: Self-pay | Admitting: Surgical

## 2020-06-25 LAB — BASIC METABOLIC PANEL
Anion gap: 12 (ref 5–15)
BUN: 23 mg/dL (ref 8–23)
CO2: 23 mmol/L (ref 22–32)
Calcium: 8.6 mg/dL — ABNORMAL LOW (ref 8.9–10.3)
Chloride: 106 mmol/L (ref 98–111)
Creatinine, Ser: 1.7 mg/dL — ABNORMAL HIGH (ref 0.61–1.24)
GFR, Estimated: 41 mL/min — ABNORMAL LOW (ref >60)
Glucose, Bld: 114 mg/dL — ABNORMAL HIGH (ref 70–99)
Potassium: 3.9 mmol/L (ref 3.5–5.1)
Sodium: 141 mmol/L (ref 135–145)

## 2020-06-25 LAB — GLUCOSE, CAPILLARY: Glucose-Capillary: 141 mg/dL — ABNORMAL HIGH (ref 70–99)

## 2020-06-25 MED ORDER — ATORVASTATIN CALCIUM 80 MG PO TABS: 80.0000 mg | ORAL_TABLET | Freq: Every day | ORAL | 1 refills | Status: AC

## 2020-06-25 MED ORDER — ASPIRIN 325 MG PO TBEC
325.0000 mg | DELAYED_RELEASE_TABLET | Freq: Every day | ORAL | Status: DC
Start: 2020-06-25 — End: 2020-08-31

## 2020-06-25 MED ORDER — TRAMADOL HCL 50 MG PO TABS: 50.0000 mg | ORAL_TABLET | Freq: Four times a day (QID) | ORAL | 0 refills | Status: AC | PRN

## 2020-06-25 MED ORDER — EMPAGLIFLOZIN 10 MG PO TABS
10.0000 mg | ORAL_TABLET | Freq: Every day | ORAL | 1 refills | Status: DC
Start: 2020-06-25 — End: 2022-06-07

## 2020-06-25 MED ORDER — METOPROLOL TARTRATE 37.5 MG PO TABS: 37.5000 mg | ORAL_TABLET | Freq: Two times a day (BID) | ORAL | 1 refills | Status: DC

## 2020-06-25 NOTE — Plan of Care (Signed)
Continue to monitor

## 2020-06-25 NOTE — Progress Notes (Addendum)
[address]       [place] [address]             [phone]      6 Days Post-Op Procedure(s) (LRB): CORONARY ARTERY BYPASS GRAFTING (CABG) times four , using left internal mammary artery and right leg greater saphenous vein harvested endoscopically (N/A) TRANSESOPHAGEAL ECHOCARDIOGRAM (TEE) (N/A) Subjective: Feels pretty well overall  Objective: Vital signs in last 24 hours: Temp:  [98 F (36.7 C)-99.1 F (37.3 C)] 99.1 F (37.3 C) (11/14 0300) Pulse Rate:  [66-96] 66 (11/14 0300) Cardiac Rhythm: Normal sinus rhythm (11/14 0701) Resp:  [18-20] 20 (11/14 0300) BP: (101-138)/(60-73) 138/73 (11/14 0300) SpO2:  [96 %-97 %] 96 % (11/14 0300) Weight:  [126.8 kg] 126.8 kg (11/14 0300)  Hemodynamic parameters for last 24 hours:    Intake/Output from previous day: 11/13 0701 - 11/14 0700 In: 840 [P.O.:840] Out: 2300 [Urine:2300] Intake/Output this shift: No intake/output data recorded.  General appearance: alert, cooperative and no distress Heart: regular rate and rhythm Lungs: clear to auscultation bilaterally Abdomen: benign Extremities: trace edema Wound: incis healing well  Lab Results: Recent Labs    06/23/20 0251  WBC 6.8  HGB 8.3*  HCT 25.5*  PLT 144*   BMET:  Recent Labs    06/24/20 0304 06/25/20 0211  NA 140 141  K 4.0 3.9  CL 109 106  CO2 24 23  GLUCOSE 127* 114*  BUN 21 23  CREATININE 1.57* 1.70*  CALCIUM 8.7* 8.6*    PT/INR: No results for input(s): LABPROT, INR in the last 72 hours. ABG    Component Value Date/Time   PHART 7.387 06/19/2020 2305   HCO3 23.9 06/19/2020 2305   TCO2 25 06/19/2020 2305   ACIDBASEDEF 1.0 06/19/2020 2305   O2SAT 98.0 06/19/2020 2305   CBG (last 3)  Recent Labs    06/24/20 1620 06/24/20 2114 06/25/20 0611  GLUCAP 170* 222* 141*    Meds Scheduled Meds: . aspirin EC  325 mg Oral Daily   Or  . aspirin  324 mg Per Tube Daily  . atorvastatin  80 mg Oral Daily  . bisacodyl  10  mg Oral Daily   Or  . bisacodyl  10 mg Rectal Daily  . Chlorhexidine Gluconate Cloth  6 each Topical Daily  . [place] [facility], Patient & Family Education   Does not apply Once  . docusate sodium  200 mg Oral Daily  . empagliflozin  10 mg Oral Daily  . enoxaparin (LOVENOX) injection  30 mg Subcutaneous QHS  . furosemide  40 mg Oral Daily  . insulin aspart  0-24 Units Subcutaneous TID AC & HS  . metoprolol tartrate  25 mg Oral BID  . midodrine  5 mg Oral TID WC  . pantoprazole  40 mg Oral Daily  . potassium chloride  40 mEq Oral Daily  . sodium chloride flush  3 mL Intravenous Q12H  . sodium chloride flush  3 mL Intravenous Q12H   Continuous Infusions: . sodium chloride Stopped (06/21/20 0949)  . sodium chloride 250 mL (06/20/20 0623)  . sodium chloride Stopped (06/19/20 1315)  . sodium chloride    . lactated ringers Stopped (06/19/20 1354)   PRN Meds:.sodium chloride, sodium chloride, dextrose, ondansetron (ZOFRAN) IV, oxyCODONE, sodium chloride flush, sodium chloride flush, traMADol  Xrays No results found.  Assessment/Plan: S/P Procedure(s) (LRB): CORONARY ARTERY BYPASS GRAFTING (CABG) times four , using left internal mammary artery and right  leg greater saphenous vein harvested endoscopically (N/A) TRANSESOPHAGEAL ECHOCARDIOGRAM (TEE) (N/A)   1 afeb, VSS 2 sinus rhythm with PAC's unchanged, no a fib/flutter, will increase beta blocker a little 3 sats good on RA 4 creat trending up a little, but not much off baseline 5 BS control is fair- jardiance started yesterday- will need close outpatient management of metabolic syndrome 6 good diuresis, does not appear edematous, will d/c lasix since creat trended up slightly 7 appears stable for d/c  LOS: 11 days    Rowe Clack PA-C Pager [phone] 06/25/2020 Patient seen and examined, agree with above Home today  Viviann Spare C. Dorris Fetch, MD [facility] [phone]

## 2020-06-25 NOTE — TOC Transition Note (Signed)
Transition of Care (TOC) - CM/SW Discharge Note Donn Pierini RN, BSN Transitions of Care Unit 4E- RN Case Manager See Treatment Team for direct phone #    Patient Details  Name: Larry May MRN: [id number] Date of Birth: 1946/01/22  Transition of Care [facility]) CM/SW Contact:  Darrold Span, RN Phone Number: 06/25/2020, 3:03 PM   Clinical Narrative:    Pt stable for transition home, notified by unit RN that pt wants a RW for home, order for DME placed and call made to [employer] w/c DME line for DME referral- RW to be delivered to room prior to discharge- son to transport home   Final next level of care: Home/Self Care Barriers to Discharge: No Barriers Identified   Patient Goals and CMS Choice    N/A    Discharge Placement               Home        Discharge Plan and Services                DME Arranged: Walker rolling DME Agency: [employer] Date DME Agency Contacted: 06/25/20 Time DME Agency Contacted: [phone] Representative spoke with at DME Agency: [facility] HH Arranged: NA HH Agency: NA        Social Determinants of Health (SDOH) Interventions     Readmission Risk Interventions No flowsheet data found.

## 2020-06-25 NOTE — Progress Notes (Signed)
Pt/family given discharge instructions, medication lists, follow up appointments, and when to call the doctor.  Pt/family verbalizes understanding. Patient rec'd rolling walker. All questions answered. Pt transported to main entrance via wheelchair. Thomas Hoff, RN

## 2020-06-25 NOTE — Progress Notes (Signed)
Mobility Specialist: Progress Note   06/25/20 1224  Mobility  Activity Refused mobility   Pt refused mobility and states he is discharging soon.   [facility] Pawel Soules Mobility Specialist

## 2020-06-26 ENCOUNTER — Telehealth (HOSPITAL_COMMUNITY): Payer: Self-pay

## 2020-06-26 NOTE — Telephone Encounter (Signed)
Faxed cardiac rehab referral to [facility] cardiac rehab per phase I. 

## 2020-06-28 DIAGNOSIS — L97522 Non-pressure chronic ulcer of other part of left foot with fat layer exposed: Secondary | ICD-10-CM | POA: Diagnosis not present

## 2020-06-28 DIAGNOSIS — L603 Nail dystrophy: Secondary | ICD-10-CM | POA: Diagnosis not present

## 2020-06-28 DIAGNOSIS — E785 Hyperlipidemia, unspecified: Secondary | ICD-10-CM | POA: Diagnosis not present

## 2020-06-28 DIAGNOSIS — I25119 Atherosclerotic heart disease of native coronary artery with unspecified angina pectoris: Secondary | ICD-10-CM | POA: Diagnosis not present

## 2020-06-28 DIAGNOSIS — Z951 Presence of aortocoronary bypass graft: Secondary | ICD-10-CM | POA: Diagnosis not present

## 2020-06-28 DIAGNOSIS — R809 Proteinuria, unspecified: Secondary | ICD-10-CM | POA: Diagnosis not present

## 2020-06-28 DIAGNOSIS — I252 Old myocardial infarction: Secondary | ICD-10-CM | POA: Diagnosis not present

## 2020-06-28 DIAGNOSIS — N179 Acute kidney failure, unspecified: Secondary | ICD-10-CM | POA: Diagnosis not present

## 2020-06-28 DIAGNOSIS — D62 Acute posthemorrhagic anemia: Secondary | ICD-10-CM | POA: Diagnosis not present

## 2020-06-28 DIAGNOSIS — E1129 Type 2 diabetes mellitus with other diabetic kidney complication: Secondary | ICD-10-CM | POA: Diagnosis not present

## 2020-06-28 DIAGNOSIS — N1832 Chronic kidney disease, stage 3b: Secondary | ICD-10-CM | POA: Diagnosis not present

## 2020-06-28 DIAGNOSIS — E11621 Type 2 diabetes mellitus with foot ulcer: Secondary | ICD-10-CM | POA: Diagnosis not present

## 2020-06-28 DIAGNOSIS — I5032 Chronic diastolic (congestive) heart failure: Secondary | ICD-10-CM | POA: Diagnosis not present

## 2020-07-03 DIAGNOSIS — E119 Type 2 diabetes mellitus without complications: Secondary | ICD-10-CM | POA: Insufficient documentation

## 2020-07-03 DIAGNOSIS — I10 Essential (primary) hypertension: Secondary | ICD-10-CM | POA: Insufficient documentation

## 2020-07-03 DIAGNOSIS — E785 Hyperlipidemia, unspecified: Secondary | ICD-10-CM | POA: Insufficient documentation

## 2020-07-11 DIAGNOSIS — I130 Hypertensive heart and chronic kidney disease with heart failure and stage 1 through stage 4 chronic kidney disease, or unspecified chronic kidney disease: Secondary | ICD-10-CM | POA: Diagnosis not present

## 2020-07-11 DIAGNOSIS — E1122 Type 2 diabetes mellitus with diabetic chronic kidney disease: Secondary | ICD-10-CM | POA: Diagnosis not present

## 2020-07-11 DIAGNOSIS — N183 Chronic kidney disease, stage 3 unspecified: Secondary | ICD-10-CM | POA: Diagnosis not present

## 2020-07-11 DIAGNOSIS — I5032 Chronic diastolic (congestive) heart failure: Secondary | ICD-10-CM | POA: Diagnosis not present

## 2020-07-12 ENCOUNTER — Ambulatory Visit: Payer: Medicare HMO | Admitting: Cardiology

## 2020-07-14 ENCOUNTER — Other Ambulatory Visit: Payer: Self-pay

## 2020-07-14 ENCOUNTER — Ambulatory Visit (INDEPENDENT_AMBULATORY_CARE_PROVIDER_SITE_OTHER): Payer: Self-pay | Admitting: Thoracic Surgery (Cardiothoracic Vascular Surgery)

## 2020-07-14 ENCOUNTER — Encounter: Payer: Self-pay | Admitting: Thoracic Surgery (Cardiothoracic Vascular Surgery)

## 2020-07-14 VITALS — BP 122/67 | HR 86 | Temp 98.4°F | Resp 20 | Ht 74.0 in | Wt 274.0 lb

## 2020-07-14 DIAGNOSIS — Z951 Presence of aortocoronary bypass graft: Secondary | ICD-10-CM

## 2020-07-14 DIAGNOSIS — I2510 Atherosclerotic heart disease of native coronary artery without angina pectoris: Secondary | ICD-10-CM

## 2020-07-14 MED ORDER — FUROSEMIDE 40 MG PO TABS: 40.0000 mg | ORAL_TABLET | Freq: Every day | ORAL | 0 refills | Status: DC

## 2020-07-14 NOTE — Progress Notes (Signed)
      [address]       [place] [address]             [phone]        Larry May [facility] Medical Record #[id number] Date of Birth: 03-Apr-1946  Referring: Larry Daub, MD Primary Care: Street, Larry Coup, MD Primary Cardiologist:Larry Dulce Sellar, MD  Reason for visit:   follow-up  History of Present Illness:     Larry May has done well.  He has not been ambulating much at home.  He is only using Tramadol PRN  Physical Exam: BP 122/67   Pulse 86   Temp 98.4 F (36.9 C) (Skin)   Resp 20   Ht 6\' 2"  (1.88 m)   Wt 274 lb (124.3 kg)   SpO2 94% Comment: RA  BMI 35.18 kg/m   Alert NAD Incision clean.  Sternum stable Abdomen soft, ND 2+ peripheral edema      Assessment / Plan:   74 yo male s/p CABG Added lasix for lower extremity swelling Will return to clinic in 1 month with a CXR   73 07/14/2020 10:23 AM

## 2020-07-18 DIAGNOSIS — E11621 Type 2 diabetes mellitus with foot ulcer: Secondary | ICD-10-CM | POA: Diagnosis not present

## 2020-07-18 DIAGNOSIS — E119 Type 2 diabetes mellitus without complications: Secondary | ICD-10-CM | POA: Diagnosis not present

## 2020-07-18 DIAGNOSIS — L97411 Non-pressure chronic ulcer of right heel and midfoot limited to breakdown of skin: Secondary | ICD-10-CM | POA: Diagnosis not present

## 2020-07-18 DIAGNOSIS — L97522 Non-pressure chronic ulcer of other part of left foot with fat layer exposed: Secondary | ICD-10-CM | POA: Diagnosis not present

## 2020-07-19 DIAGNOSIS — N1832 Chronic kidney disease, stage 3b: Secondary | ICD-10-CM | POA: Diagnosis not present

## 2020-07-19 DIAGNOSIS — I25119 Atherosclerotic heart disease of native coronary artery with unspecified angina pectoris: Secondary | ICD-10-CM | POA: Diagnosis not present

## 2020-07-19 DIAGNOSIS — D62 Acute posthemorrhagic anemia: Secondary | ICD-10-CM | POA: Diagnosis not present

## 2020-07-19 DIAGNOSIS — Z6835 Body mass index (BMI) 35.0-35.9, adult: Secondary | ICD-10-CM | POA: Diagnosis not present

## 2020-07-19 DIAGNOSIS — I10 Essential (primary) hypertension: Secondary | ICD-10-CM | POA: Diagnosis not present

## 2020-07-19 DIAGNOSIS — I5032 Chronic diastolic (congestive) heart failure: Secondary | ICD-10-CM | POA: Diagnosis not present

## 2020-07-19 DIAGNOSIS — E1129 Type 2 diabetes mellitus with other diabetic kidney complication: Secondary | ICD-10-CM | POA: Diagnosis not present

## 2020-07-19 DIAGNOSIS — R809 Proteinuria, unspecified: Secondary | ICD-10-CM | POA: Diagnosis not present

## 2020-07-19 NOTE — Progress Notes (Signed)
Cardiology Office Note:    Date:  07/20/2020   ID:  Larry May, DOB 1945-09-26, MRN [id number]  PCP:  Street, [name], MD  Cardiologist:  Norman Herrlich, MD    Referring MD: [address], [name], *    ASSESSMENT:    1. Coronary artery disease of native artery of native heart with stable angina pectoris (HCC)   2. Mixed hyperlipidemia   3. Primary hypertension   4. Type 2 diabetes mellitus with other circulatory complication, without long-term current use of insulin (HCC)    PLAN:    In order of problems listed above:  1. Overall he is progressing after bypass surgery told by his cardiothoracic surgeon to wait until after his visit the first week of June to start cardiac rehabilitation and is on standard treatment with high-dose aspirin for the first 90 days high intensity statin and beta-blocker. 2. Continue high intensity statin await labs drawn yesterday 3. At target continue beta-blocker watch his blood pressure closely cardiac rehab as often they return to their baseline hypertensive range during recovery 4. Stable continue SGLT2 inhibitor   Next appointment: 6 weeks   Medication Adjustments/Labs and Tests Ordered: Current medicines are reviewed at length with the patient today.  Concerns regarding medicines are outlined above.  No orders of the defined types were placed in this encounter.  No orders of the defined types were placed in this encounter.   Chief complaint follow-up after recent presentation acute coronary syndrome and subsequent coronary artery bypass surgery 06/19/2020  History of Present Illness:    Larry May is a 74 y.o. male with a hx of hypertension diabetes hyper lipidemia seen by me at [facility] when he presented with non-ST elevation MI transferred to [facility] coronary angiography showed multivessel disease and advise coronary artery bypass surgery.  He underwent coronary artery bypass surgery 06/19/2020 with a  left thoracic artery device to the left anterior descending and a vein graft to the PDA and OM1 and first diagonal branch.  Postoperatively had volume overload requiring diuresis he did not require postoperative transfusion last H&H was 8.3/25.5.  He had a small left apical pneumothorax did not require chest tube.  His creatinine was mildly elevated from baseline at discharge 1.7.  Compliance with diet, lifestyle and medications: Yes  I gave him a chance to voices displeasure with his care at [facility] it was not a medical staff it was the physical plant and unfortunately the delay between heart catheterization and bypass surgery. He remains fatigued he has a nonproductive cough but no shortness of breath chest pain palpitation or syncope.  His wounds are clear he has very little peripheral edema on lower extremities.  He has had his first postsurgical follow-up and had labs drawn yesterday his PCP office that we will try to obtain  Past Medical History:  Diagnosis Date  . Coronary artery disease 06/19/2020  . Diabetes (HCC)   . Hyperlipidemia   . Hypertension   . Non-ST elevation (NSTEMI) myocardial infarction (HCC)   . NSTEMI (non-ST elevated myocardial infarction) (HCC) 06/14/2020  . S/P CABG x 4 06/19/2020   LIMA to LAD SVG to DIAGONAL 1 SVG to OM2 SVG to PDA    Past Surgical History:  Procedure Laterality Date  . CORONARY ARTERY BYPASS GRAFT N/A 06/19/2020   Procedure: CORONARY ARTERY BYPASS GRAFTING (CABG) times four , using left internal mammary artery and right leg greater saphenous vein harvested endoscopically;  Surgeon: Corliss Skains,  MD;  Location: [facility] OR;  Service: Open Heart Surgery;  Laterality: N/A;  . HERNIA REPAIR     x 3  . LEFT HEART CATH AND CORONARY ANGIOGRAPHY N/A 06/14/2020   Procedure: LEFT HEART CATH AND CORONARY ANGIOGRAPHY;  Surgeon: Kathleene Hazel, MD;  Location: [facility] INVASIVE CV LAB;  Service: Cardiovascular;  Laterality: N/A;  . PROSTATE  SURGERY    . TEE WITHOUT CARDIOVERSION N/A 06/19/2020   Procedure: TRANSESOPHAGEAL ECHOCARDIOGRAM (TEE);  Surgeon: Corliss Skains, MD;  Location: [facility] OR;  Service: Open Heart Surgery;  Laterality: N/A;    Current Medications: Current Meds  Medication Sig  . aspirin EC 325 MG EC tablet Take 1 tablet (325 mg total) by mouth daily.  Marland Kitchen atorvastatin (LIPITOR) 80 MG tablet Take 1 tablet (80 mg total) by mouth daily.  . empagliflozin (JARDIANCE) 10 MG TABS tablet Take 1 tablet (10 mg total) by mouth daily.  . furosemide (LASIX) 40 MG tablet Take 1 tablet (40 mg total) by mouth daily.  . metoprolol tartrate 37.5 MG TABS Take 37.5 mg by mouth 2 (two) times daily.     Allergies:   Patient has no known allergies.   Social History   Socioeconomic History  . Marital status: Divorced    Spouse name: Not on file  . Number of children: Not on file  . Years of education: Not on file  . Highest education level: Not on file  Occupational History  . Not on file  Tobacco Use  . Smoking status: Light Tobacco Smoker    Types: Pipe  . Smokeless tobacco: Never Used  Substance and Sexual Activity  . Alcohol use: Not on file  . Drug use: Not on file  . Sexual activity: Not on file  Other Topics Concern  . Not on file  Social History Narrative  . Not on file   Social Determinants of Health   Financial Resource Strain: Not on file  Food Insecurity: Not on file  Transportation Needs: Not on file  Physical Activity: Not on file  Stress: Not on file  Social Connections: Not on file     Family History: The patient's family history includes Heart attack in his father. ROS:   Please see the history of present illness.    All other systems reviewed and are negative.  EKGs/Labs/Other Studies Reviewed:    The following studies were reviewed today:   Recent Labs: 06/19/2020: ALT 31 06/23/2020: Hemoglobin 8.3; Platelets 144 06/24/2020: Magnesium 1.9 06/25/2020: BUN 23; Creatinine, Ser  1.70; Potassium 3.9; Sodium 141  Recent Lipid Panel    Component Value Date/Time   CHOL 120 06/17/2020 0302   TRIG 96 06/17/2020 0302   HDL 35 (L) 06/17/2020 0302   CHOLHDL 3.4 06/17/2020 0302   VLDL 19 06/17/2020 0302   LDLCALC 66 06/17/2020 0302    Physical Exam:    VS:  BP 130/70   Pulse 96   Ht 6\' 2"  (1.88 m)   Wt 267 lb (121.1 kg)   SpO2 94%   BMI 34.28 kg/m     Wt Readings from Last 3 Encounters:  07/20/20 267 lb (121.1 kg)  07/14/20 274 lb (124.3 kg)  06/25/20 279 lb 9.6 oz (126.8 kg)     GEN:  Well nourished, well developed in no acute distress HEENT: Normal NECK: No JVD; No carotid bruits LYMPHATICS: No lymphadenopathy CARDIAC: RRR, no murmurs, rubs, gallops RESPIRATORY:  Clear to auscultation without rales, wheezing or rhonchi  ABDOMEN: Soft, non-tender, non-distended  MUSCULOSKELETAL:  No edema; No deformity  SKIN: Warm and dry NEUROLOGIC:  Alert and oriented x 3 PSYCHIATRIC:  Normal affect    Signed, Norman Herrlich, MD  07/20/2020 10:03 AM    [facility]

## 2020-07-20 ENCOUNTER — Ambulatory Visit: Payer: Medicare HMO | Admitting: Cardiology

## 2020-07-20 ENCOUNTER — Encounter: Payer: Self-pay | Admitting: Cardiology

## 2020-07-20 ENCOUNTER — Other Ambulatory Visit: Payer: Self-pay

## 2020-07-20 VITALS — BP 130/70 | HR 96 | Ht 74.0 in | Wt 267.0 lb

## 2020-07-20 DIAGNOSIS — E1159 Type 2 diabetes mellitus with other circulatory complications: Secondary | ICD-10-CM

## 2020-07-20 DIAGNOSIS — I25118 Atherosclerotic heart disease of native coronary artery with other forms of angina pectoris: Secondary | ICD-10-CM

## 2020-07-20 DIAGNOSIS — I10 Essential (primary) hypertension: Secondary | ICD-10-CM

## 2020-07-20 DIAGNOSIS — E782 Mixed hyperlipidemia: Secondary | ICD-10-CM | POA: Diagnosis not present

## 2020-07-20 NOTE — Patient Instructions (Addendum)
Medication Instructions:  Your physician recommends that you continue on your current medications as directed. Please refer to the Current Medication list given to you today.  *If you need a refill on your cardiac medications before your next appointment, please call your pharmacy*   Lab Work: None If you have labs (blood work) drawn today and your tests are completely normal, you will receive your results only by: Marland Kitchen MyChart Message (if you have MyChart) OR . A paper copy in the mail If you have any lab test that is abnormal or we need to change your treatment, we will call you to review the results.   Testing/Procedures: None   Follow-Up: At [facility], you and your health needs are our priority.  As part of our continuing mission to provide you with exceptional heart care, we have created designated Provider Care Teams.  These Care Teams include your primary Cardiologist (physician) and Advanced Practice Providers (APPs -  Physician Assistants and Nurse Practitioners) who all work together to provide you with the care you need, when you need it.  We recommend signing up for the patient portal called "MyChart".  Sign up information is provided on this After Visit Summary.  MyChart is used to connect with patients for Virtual Visits (Telemedicine).  Patients are able to view lab/test results, encounter notes, upcoming appointments, etc.  Non-urgent messages can be sent to your provider as well.   To learn more about what you can do with MyChart, go to [email or web address].    Your next appointment:   6 WEEKS  The format for your next appointment:   In Person  Provider:   Norman Herrlich, MD   Other Instructions

## 2020-07-24 DIAGNOSIS — Z955 Presence of coronary angioplasty implant and graft: Secondary | ICD-10-CM | POA: Diagnosis not present

## 2020-07-24 DIAGNOSIS — L97528 Non-pressure chronic ulcer of other part of left foot with other specified severity: Secondary | ICD-10-CM | POA: Diagnosis not present

## 2020-07-24 DIAGNOSIS — L03115 Cellulitis of right lower limb: Secondary | ICD-10-CM | POA: Diagnosis not present

## 2020-07-24 DIAGNOSIS — L97522 Non-pressure chronic ulcer of other part of left foot with fat layer exposed: Secondary | ICD-10-CM | POA: Diagnosis not present

## 2020-07-24 DIAGNOSIS — E1142 Type 2 diabetes mellitus with diabetic polyneuropathy: Secondary | ICD-10-CM | POA: Diagnosis not present

## 2020-07-24 DIAGNOSIS — I82432 Acute embolism and thrombosis of left popliteal vein: Secondary | ICD-10-CM | POA: Diagnosis not present

## 2020-07-24 DIAGNOSIS — R69 Illness, unspecified: Secondary | ICD-10-CM | POA: Diagnosis not present

## 2020-07-24 DIAGNOSIS — I1310 Hypertensive heart and chronic kidney disease without heart failure, with stage 1 through stage 4 chronic kidney disease, or unspecified chronic kidney disease: Secondary | ICD-10-CM | POA: Diagnosis not present

## 2020-07-24 DIAGNOSIS — E119 Type 2 diabetes mellitus without complications: Secondary | ICD-10-CM | POA: Diagnosis not present

## 2020-07-24 DIAGNOSIS — N1832 Chronic kidney disease, stage 3b: Secondary | ICD-10-CM | POA: Diagnosis not present

## 2020-07-24 DIAGNOSIS — L97519 Non-pressure chronic ulcer of other part of right foot with unspecified severity: Secondary | ICD-10-CM | POA: Diagnosis not present

## 2020-07-24 DIAGNOSIS — M868X7 Other osteomyelitis, ankle and foot: Secondary | ICD-10-CM | POA: Diagnosis not present

## 2020-07-24 DIAGNOSIS — L97529 Non-pressure chronic ulcer of other part of left foot with unspecified severity: Secondary | ICD-10-CM | POA: Diagnosis not present

## 2020-07-24 DIAGNOSIS — L97929 Non-pressure chronic ulcer of unspecified part of left lower leg with unspecified severity: Secondary | ICD-10-CM | POA: Diagnosis not present

## 2020-07-24 DIAGNOSIS — M86172 Other acute osteomyelitis, left ankle and foot: Secondary | ICD-10-CM | POA: Diagnosis not present

## 2020-07-24 DIAGNOSIS — I2510 Atherosclerotic heart disease of native coronary artery without angina pectoris: Secondary | ICD-10-CM | POA: Diagnosis not present

## 2020-07-24 DIAGNOSIS — E11621 Type 2 diabetes mellitus with foot ulcer: Secondary | ICD-10-CM | POA: Diagnosis not present

## 2020-07-24 DIAGNOSIS — I96 Gangrene, not elsewhere classified: Secondary | ICD-10-CM | POA: Diagnosis not present

## 2020-07-24 DIAGNOSIS — E11628 Type 2 diabetes mellitus with other skin complications: Secondary | ICD-10-CM | POA: Diagnosis not present

## 2020-07-24 DIAGNOSIS — Z20822 Contact with and (suspected) exposure to covid-19: Secondary | ICD-10-CM | POA: Diagnosis not present

## 2020-07-24 DIAGNOSIS — L089 Local infection of the skin and subcutaneous tissue, unspecified: Secondary | ICD-10-CM | POA: Diagnosis not present

## 2020-07-24 DIAGNOSIS — N183 Chronic kidney disease, stage 3 unspecified: Secondary | ICD-10-CM | POA: Diagnosis not present

## 2020-07-24 DIAGNOSIS — L97525 Non-pressure chronic ulcer of other part of left foot with muscle involvement without evidence of necrosis: Secondary | ICD-10-CM | POA: Diagnosis not present

## 2020-07-24 DIAGNOSIS — L97526 Non-pressure chronic ulcer of other part of left foot with bone involvement without evidence of necrosis: Secondary | ICD-10-CM | POA: Diagnosis not present

## 2020-07-24 DIAGNOSIS — E08621 Diabetes mellitus due to underlying condition with foot ulcer: Secondary | ICD-10-CM | POA: Diagnosis not present

## 2020-07-24 DIAGNOSIS — G4733 Obstructive sleep apnea (adult) (pediatric): Secondary | ICD-10-CM | POA: Diagnosis not present

## 2020-07-24 DIAGNOSIS — E1122 Type 2 diabetes mellitus with diabetic chronic kidney disease: Secondary | ICD-10-CM | POA: Diagnosis not present

## 2020-07-24 DIAGNOSIS — L97419 Non-pressure chronic ulcer of right heel and midfoot with unspecified severity: Secondary | ICD-10-CM | POA: Diagnosis not present

## 2020-07-24 DIAGNOSIS — M86272 Subacute osteomyelitis, left ankle and foot: Secondary | ICD-10-CM | POA: Diagnosis not present

## 2020-07-24 DIAGNOSIS — M7989 Other specified soft tissue disorders: Secondary | ICD-10-CM | POA: Diagnosis not present

## 2020-07-24 DIAGNOSIS — E1169 Type 2 diabetes mellitus with other specified complication: Secondary | ICD-10-CM | POA: Diagnosis not present

## 2020-08-17 ENCOUNTER — Other Ambulatory Visit: Payer: Self-pay | Admitting: Thoracic Surgery (Cardiothoracic Vascular Surgery)

## 2020-08-17 DIAGNOSIS — I25119 Atherosclerotic heart disease of native coronary artery with unspecified angina pectoris: Secondary | ICD-10-CM | POA: Diagnosis not present

## 2020-08-17 DIAGNOSIS — N1832 Chronic kidney disease, stage 3b: Secondary | ICD-10-CM | POA: Diagnosis not present

## 2020-08-17 DIAGNOSIS — I5032 Chronic diastolic (congestive) heart failure: Secondary | ICD-10-CM | POA: Diagnosis not present

## 2020-08-17 DIAGNOSIS — S98132A Complete traumatic amputation of one left lesser toe, initial encounter: Secondary | ICD-10-CM | POA: Diagnosis not present

## 2020-08-17 DIAGNOSIS — Z6835 Body mass index (BMI) 35.0-35.9, adult: Secondary | ICD-10-CM | POA: Diagnosis not present

## 2020-08-17 DIAGNOSIS — I70213 Atherosclerosis of native arteries of extremities with intermittent claudication, bilateral legs: Secondary | ICD-10-CM | POA: Diagnosis not present

## 2020-08-17 DIAGNOSIS — E1129 Type 2 diabetes mellitus with other diabetic kidney complication: Secondary | ICD-10-CM | POA: Diagnosis not present

## 2020-08-17 DIAGNOSIS — Z951 Presence of aortocoronary bypass graft: Secondary | ICD-10-CM | POA: Diagnosis not present

## 2020-08-17 DIAGNOSIS — R809 Proteinuria, unspecified: Secondary | ICD-10-CM | POA: Diagnosis not present

## 2020-08-18 ENCOUNTER — Ambulatory Visit
Admission: RE | Admit: 2020-08-18 | Discharge: 2020-08-18 | Disposition: A | Discharge status: Home / Self Care | Payer: Medicare HMO | Source: Ambulatory Visit | Attending: Thoracic Surgery (Cardiothoracic Vascular Surgery) | Admitting: Thoracic Surgery (Cardiothoracic Vascular Surgery)

## 2020-08-18 ENCOUNTER — Other Ambulatory Visit: Payer: Self-pay

## 2020-08-18 ENCOUNTER — Encounter: Payer: Self-pay | Admitting: Thoracic Surgery (Cardiothoracic Vascular Surgery)

## 2020-08-18 ENCOUNTER — Ambulatory Visit (INDEPENDENT_AMBULATORY_CARE_PROVIDER_SITE_OTHER): Payer: Self-pay | Admitting: Thoracic Surgery (Cardiothoracic Vascular Surgery)

## 2020-08-18 VITALS — BP 94/60 | HR 72 | Resp 20 | Ht 74.0 in | Wt 267.0 lb

## 2020-08-18 DIAGNOSIS — Z951 Presence of aortocoronary bypass graft: Secondary | ICD-10-CM

## 2020-08-18 DIAGNOSIS — I2510 Atherosclerotic heart disease of native coronary artery without angina pectoris: Secondary | ICD-10-CM

## 2020-08-18 DIAGNOSIS — J9811 Atelectasis: Secondary | ICD-10-CM | POA: Diagnosis not present

## 2020-08-18 DIAGNOSIS — J939 Pneumothorax, unspecified: Secondary | ICD-10-CM | POA: Diagnosis not present

## 2020-08-18 NOTE — Progress Notes (Signed)
      [address]       [place] [address]             [phone]        PRATIK DALZIEL [facility] Medical Record #[id number] Date of Birth: [birth year]  Referring: Street, [name], * Primary Care: Street, [name], MD Primary Cardiologist:Brian Dulce Sellar, MD  Reason for visit:   follow-up  History of Present Illness:     Patient presents for his 1 month follow-up appointment.  Overall he is doing well.  He denies any chest pain or shortness of breath.  His ambulation has been limited by his left foot.  Physical Exam: BP 94/60   Pulse 72   Resp 20   Ht 6\' 2"  (1.88 m)   Wt 267 lb (121.1 kg)   SpO2 99% Comment: RA with mask on  BMI 34.28 kg/m   Alert NAD Incision clean.  Sternum stable Abdomen soft, ND No peripheral edema   Diagnostic Studies & Laboratory data: CXR: Clear     Assessment / Plan:   75 year old male status post CABG.  Currently doing well Cleared for cardiac rehab Return to clinic as needed.   73 08/18/2020 4:16 PM

## 2020-08-29 DIAGNOSIS — L97525 Non-pressure chronic ulcer of other part of left foot with muscle involvement without evidence of necrosis: Secondary | ICD-10-CM | POA: Diagnosis not present

## 2020-08-29 DIAGNOSIS — E11621 Type 2 diabetes mellitus with foot ulcer: Secondary | ICD-10-CM | POA: Diagnosis not present

## 2020-08-30 NOTE — Progress Notes (Deleted)
Cardiology Office Note:    Date:  08/30/2020   ID:  Larry May, DOB 30-Mar-1946, MRN [id number]  PCP:  Street, [name], MD  Cardiologist:  Norman Herrlich, MD    Referring MD: [address] [name], *    ASSESSMENT:    No diagnosis found. PLAN:    In order of problems listed above:  1. ***   Next appointment: ***   Medication Adjustments/Labs and Tests Ordered: Current medicines are reviewed at length with the patient today.  Concerns regarding medicines are outlined above.  No orders of the defined types were placed in this encounter.  No orders of the defined types were placed in this encounter.   No chief complaint on file.   History of Present Illness:    Larry May is a 75 y.o. male with a hx of hypertension diabetes hyper lipidemia seen by me at [facility] when he presented with non-ST elevation MI transferred to [facility] coronary angiography showed multivessel disease and advise coronary artery bypass surgery.  He underwent coronary artery bypass surgery 06/19/2020 with a left thoracic artery device to the left anterior descending and a vein graft to the PDA and OM1 and first diagonal branch.  Postoperatively had volume overload requiring diuresis he did not require postoperative transfusion last H&H was 8.3/25.5.  He had a small left apical pneumothorax did not require chest tube.  His creatinine was mildly elevated from baseline at discharge 1.7.  He was last seen 07/20/2020 progressing well after bypass surgery with plans to initiate cardiac rehabilitation after follow-up with CT surgery.. Compliance with diet, lifestyle and medications: *** Past Medical History:  Diagnosis Date  . Coronary artery disease 06/19/2020  . Diabetes (HCC)   . Hyperlipidemia   . Hypertension   . Non-ST elevation (NSTEMI) myocardial infarction (HCC)   . NSTEMI (non-ST elevated myocardial infarction) (HCC) 06/14/2020  . 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 CORONARY ARTERY BYPASS GRAFT N/A 06/19/2020   Procedure: CORONARY ARTERY BYPASS GRAFTING (CABG) times four , using left internal mammary artery and right leg greater saphenous vein harvested endoscopically;  Surgeon: Corliss Skains, MD;  Location: [facility] OR;  Service: Open Heart Surgery;  Laterality: N/A;  . HERNIA REPAIR     x 3  . LEFT HEART CATH AND CORONARY ANGIOGRAPHY N/A 06/14/2020   Procedure: LEFT HEART CATH AND CORONARY ANGIOGRAPHY;  Surgeon: Kathleene Hazel, MD;  Location: [facility] INVASIVE CV LAB;  Service: Cardiovascular;  Laterality: N/A;  . PROSTATE SURGERY    . TEE WITHOUT CARDIOVERSION N/A 06/19/2020   Procedure: TRANSESOPHAGEAL ECHOCARDIOGRAM (TEE);  Surgeon: Corliss Skains, MD;  Location: [facility] OR;  Service: Open Heart Surgery;  Laterality: N/A;    Current Medications: No outpatient medications have been marked as taking for the 09/01/20 encounter (Appointment) with Baldo Daub, MD.     Allergies:   Patient has no known allergies.   Social History   Socioeconomic History  . Marital status: Divorced    Spouse name: Not on file  . Number of children: Not on file  . Years of education: Not on file  . Highest education level: Not on file  Occupational History  . Not on file  Tobacco Use  . Smoking status: Light Tobacco Smoker    Types: Pipe  . Smokeless tobacco: Never Used  Substance and Sexual Activity  . Alcohol use: Not on file  . Drug use: Not on file  . Sexual activity: Not on file  Other Topics Concern  . Not on file  Social History Narrative  . Not on file   Social Determinants of Health   Financial Resource Strain: Not on file  Food Insecurity: Not on file  Transportation Needs: Not on file  Physical Activity: Not on file  Stress: Not on file  Social Connections: Not on file     Family History: The patient's ***family history includes  Heart attack in his father. ROS:   Please see the history of present illness.    All other systems reviewed and are negative.  EKGs/Labs/Other Studies Reviewed:    The following studies were reviewed today:  EKG:  EKG ordered today and personally reviewed.  The ekg ordered today demonstrates ***  Recent Labs: 06/19/2020: ALT 31 06/23/2020: Hemoglobin 8.3; Platelets 144 06/24/2020: Magnesium 1.9 06/25/2020: BUN 23; Creatinine, Ser 1.70; Potassium 3.9; Sodium 141  Recent Lipid Panel    Component Value Date/Time   CHOL 120 06/17/2020 0302   TRIG 96 06/17/2020 0302   HDL 35 (L) 06/17/2020 0302   CHOLHDL 3.4 06/17/2020 0302   VLDL 19 06/17/2020 0302   LDLCALC 66 06/17/2020 0302    Physical Exam:    VS:  There were no vitals taken for this visit.    Wt Readings from Last 3 Encounters:  08/18/20 267 lb (121.1 kg)  07/20/20 267 lb (121.1 kg)  07/14/20 274 lb (124.3 kg)     GEN: *** Well nourished, well developed in no acute distress HEENT: Normal NECK: No JVD; No carotid bruits LYMPHATICS: No lymphadenopathy CARDIAC: ***RRR, no murmurs, rubs, gallops RESPIRATORY:  Clear to auscultation without rales, wheezing or rhonchi  ABDOMEN: Soft, non-tender, non-distended MUSCULOSKELETAL:  No edema; No deformity  SKIN: Warm and dry NEUROLOGIC:  Alert and oriented x 3 PSYCHIATRIC:  Normal affect    Signed, Norman Herrlich, MD  08/30/2020 4:14 PM     [facility]

## 2020-08-31 ENCOUNTER — Ambulatory Visit (INDEPENDENT_AMBULATORY_CARE_PROVIDER_SITE_OTHER): Payer: Medicare HMO | Admitting: Cardiology

## 2020-08-31 ENCOUNTER — Other Ambulatory Visit: Payer: Self-pay

## 2020-08-31 ENCOUNTER — Encounter: Payer: Self-pay | Admitting: Cardiology

## 2020-08-31 VITALS — BP 124/78 | HR 66 | Ht 74.0 in | Wt 275.2 lb

## 2020-08-31 DIAGNOSIS — I10 Essential (primary) hypertension: Secondary | ICD-10-CM

## 2020-08-31 DIAGNOSIS — I25118 Atherosclerotic heart disease of native coronary artery with other forms of angina pectoris: Secondary | ICD-10-CM | POA: Diagnosis not present

## 2020-08-31 DIAGNOSIS — E1159 Type 2 diabetes mellitus with other circulatory complications: Secondary | ICD-10-CM | POA: Diagnosis not present

## 2020-08-31 DIAGNOSIS — E782 Mixed hyperlipidemia: Secondary | ICD-10-CM | POA: Diagnosis not present

## 2020-08-31 MED ORDER — METOPROLOL TARTRATE 25 MG PO TABS: 25.0000 mg | ORAL_TABLET | Freq: Two times a day (BID) | ORAL | 3 refills | Status: DC

## 2020-08-31 NOTE — Progress Notes (Signed)
Cardiology Office Note:    Date:  08/31/2020   ID:  Larry May, DOB [birth year], MRN [id number]  PCP:  Street, [name], MD  Cardiologist:  Norman Herrlich, MD    Referring MD: [address] [name], *    ASSESSMENT:    1. Coronary artery disease of native artery of native heart with stable angina pectoris (HCC)   2. Mixed hyperlipidemia   3. Primary hypertension   4. Type 2 diabetes mellitus with other circulatory complication, without long-term current use of insulin (HCC)    PLAN:    In order of problems listed above:  1. Overall he is done well he is making good recovery from bypass and hopefully go to cardiac rehab after his foot heals.  He has had follow-up with CT surgery I asked him to resume his incentive spirometry with cough and sputum he has a clear chest x-ray he is not short of breath. 2. Lipids at target continue statin 3. BP at target continue current treatment diuretic beta-blocker and decrease his metoprolol with complaints of wheezing 4. Lipids at target continue current statin   Next appointment: Next months   Medication Adjustments/Labs and Tests Ordered: Current medicines are reviewed at length with the patient today.  Concerns regarding medicines are outlined above.  No orders of the defined types were placed in this encounter.  No orders of the defined types were placed in this encounter.   Chief Complaint  Patient presents with  . Follow-up  . Tachycardia    Bypass surgery 06/19/2020    History of Present Illness:    Larry May is a 75 y.o. male with a hx of hypertension diabetes hyper lipidemia seen by me at [facility] when he presented with non-ST elevation MI transferred to [facility] coronary angiography showed multivessel disease and advise coronary artery bypass surgery.  He underwent coronary artery bypass surgery 06/19/2020 with a left thoracic artery device to the left anterior descending and a vein graft  to the PDA and OM1 and first diagonal branch.  Postoperatively had volume overload requiring diuresis he did not require postoperative transfusion last H&H was 8.3/25.5.  He had a small left apical pneumothorax did not require chest tube.  His creatinine was mildly elevated from baseline at discharge 1.7.  He was last seen 07/20/2020 progressing well after bypass surgery with plans to initiate cardiac rehabilitation after follow-up with CT surgery.  Compliance with diet, lifestyle and medications: Yes  He has improved with a couple complaints the first is delayed recovery has not started cardiac rehab because of the toe amputation.  He anticipates joining in the next few weeks the second is he notices wheezing and cough sputum and I asked him to resume his incentive spirometry he had a chest x-ray performed 08/18/2020 that showed resolution of the pneumothorax and full expansion of his lungs.  Third complaint is intermittent sternal pain but sternum is stable by physical examination.  Recent labs performed shows lipids at target LDL 66 cholesterol 120 A1c good at 6.7% creatinine mildly elevated 1.60 he has had no edema palpitation or syncope he is not short of breath. Past Medical History:  Diagnosis Date  . Coronary artery disease 06/19/2020  . Diabetes (HCC)   . Hyperlipidemia   . Hypertension   . Non-ST elevation (NSTEMI) myocardial infarction (HCC)   . NSTEMI (non-ST elevated myocardial infarction) (HCC) 06/14/2020  . 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 CORONARY ARTERY BYPASS GRAFT N/A 06/19/2020   Procedure: CORONARY ARTERY BYPASS GRAFTING (CABG) times four , using left internal mammary artery and right leg greater saphenous vein harvested endoscopically;  Surgeon: Corliss Skains, MD;  Location: [facility] OR;  Service: Open Heart Surgery;  Laterality: N/A;  . HERNIA REPAIR     x 3  . LEFT HEART  CATH AND CORONARY ANGIOGRAPHY N/A 06/14/2020   Procedure: LEFT HEART CATH AND CORONARY ANGIOGRAPHY;  Surgeon: Kathleene Hazel, MD;  Location: [facility] INVASIVE CV LAB;  Service: Cardiovascular;  Laterality: N/A;  . PROSTATE SURGERY    . TEE WITHOUT CARDIOVERSION N/A 06/19/2020   Procedure: TRANSESOPHAGEAL ECHOCARDIOGRAM (TEE);  Surgeon: Corliss Skains, MD;  Location: [facility] OR;  Service: Open Heart Surgery;  Laterality: N/A;    Current Medications: Current Meds  Medication Sig  . aspirin 81 MG EC tablet Take 81 mg by mouth daily. Swallow whole.  Marland Kitchen atorvastatin (LIPITOR) 80 MG tablet Take 1 tablet (80 mg total) by mouth daily.  . empagliflozin (JARDIANCE) 10 MG TABS tablet Take 1 tablet (10 mg total) by mouth daily.  . furosemide (LASIX) 40 MG tablet Take 1 tablet (40 mg total) by mouth daily.  . metoprolol tartrate 37.5 MG TABS Take 37.5 mg by mouth 2 (two) times daily.     Allergies:   Patient has no known allergies.   Social History   Socioeconomic History  . Marital status: Divorced    Spouse name: Not on file  . Number of children: Not on file  . Years of education: Not on file  . Highest education level: Not on file  Occupational History  . Not on file  Tobacco Use  . Smoking status: Light Tobacco Smoker    Types: Pipe  . Smokeless tobacco: Never Used  Substance and Sexual Activity  . Alcohol use: Not on file  . Drug use: Not on file  . Sexual activity: Not on file  Other Topics Concern  . Not on file  Social History Narrative  . Not on file   Social Determinants of Health   Financial Resource Strain: Not on file  Food Insecurity: Not on file  Transportation Needs: Not on file  Physical Activity: Not on file  Stress: Not on file  Social Connections: Not on file     Family History: The patient's  family history includes Heart attack in his father. ROS:   Please see the history of present illness.    All other systems reviewed and are  negative.  EKGs/Labs/Other Studies Reviewed:    The following studies were reviewed today:   Recent Labs: 06/19/2020: ALT 31 06/23/2020: Hemoglobin 8.3; Platelets 144 06/24/2020: Magnesium 1.9 06/25/2020: BUN 23; Creatinine, Ser 1.70; Potassium 3.9; Sodium 141  Recent Lipid Panel    Component Value Date/Time   CHOL 120 06/17/2020 0302   TRIG 96 06/17/2020 0302   HDL 35 (L) 06/17/2020 0302   CHOLHDL 3.4 06/17/2020 0302   VLDL 19 06/17/2020 0302   LDLCALC 66 06/17/2020 0302    Physical Exam:    VS:  BP 124/78   Pulse 66   Ht 6\' 2"  (1.88 m)   Wt 275 lb 3.2 oz (124.8 kg)   SpO2 96%   BMI 35.33 kg/m     Wt Readings from Last 3 Encounters:  08/31/20 275 lb 3.2 oz (124.8 kg)  08/18/20 267  lb (121.1 kg)  07/20/20 267 lb (121.1 kg)     GEN:  Well nourished, well developed in no acute distress HEENT: Normal NECK: No JVD; No carotid bruits LYMPHATICS: No lymphadenopathy CARDIAC: Sternum is healed there is no click RRR, no murmurs, rubs, gallops RESPIRATORY:  Clear to auscultation without rales, wheezing or rhonchi  ABDOMEN: Soft, non-tender, non-distended MUSCULOSKELETAL:  No edema; No deformity  SKIN: Warm and dry NEUROLOGIC:  Alert and oriented x 3 PSYCHIATRIC:  Normal affect    Signed, Norman Herrlich, MD  08/31/2020 2:05 PM    [facility]

## 2020-08-31 NOTE — Patient Instructions (Addendum)
Medication Instructions:  Your physician has recommended you make the following change in your medication:   DECREASE: Metoprolol to 25 mg twice a day   *If you need a refill on your cardiac medications before your next appointment, please call your pharmacy*   Lab Work: None If you have labs (blood work) drawn today and your tests are completely normal, you will receive your results only by: Marland Kitchen MyChart Message (if you have MyChart) OR . A paper copy in the mail If you have any lab test that is abnormal or we need to change your treatment, we will call you to review the results.   Testing/Procedures: None   Follow-Up: At [facility], you and your health needs are our priority.  As part of our continuing mission to provide you with exceptional heart care, we have created designated Provider Care Teams.  These Care Teams include your primary Cardiologist (physician) and Advanced Practice Providers (APPs -  Physician Assistants and Nurse Practitioners) who all work together to provide you with the care you need, when you need it.  We recommend signing up for the patient portal called "MyChart".  Sign up information is provided on this After Visit Summary.  MyChart is used to connect with patients for Virtual Visits (Telemedicine).  Patients are able to view lab/test results, encounter notes, upcoming appointments, etc.  Non-urgent messages can be sent to your provider as well.   To learn more about what you can do with MyChart, go to [email or web address].    Your next appointment:   6 month(s)  The format for your next appointment:   In Person  Provider:   Norman Herrlich, MD   Other Instructions  Resume your incentive spirometry twice a day for 2-3 weeks.

## 2020-09-01 ENCOUNTER — Ambulatory Visit: Payer: Medicare HMO | Admitting: Cardiology

## 2020-09-12 DIAGNOSIS — I70213 Atherosclerosis of native arteries of extremities with intermittent claudication, bilateral legs: Secondary | ICD-10-CM | POA: Diagnosis not present

## 2020-09-12 DIAGNOSIS — L84 Corns and callosities: Secondary | ICD-10-CM | POA: Diagnosis not present

## 2020-09-12 DIAGNOSIS — S98132A Complete traumatic amputation of one left lesser toe, initial encounter: Secondary | ICD-10-CM | POA: Diagnosis not present

## 2020-09-12 DIAGNOSIS — E1129 Type 2 diabetes mellitus with other diabetic kidney complication: Secondary | ICD-10-CM | POA: Diagnosis not present

## 2020-09-12 DIAGNOSIS — R809 Proteinuria, unspecified: Secondary | ICD-10-CM | POA: Diagnosis not present

## 2020-09-12 DIAGNOSIS — G63 Polyneuropathy in diseases classified elsewhere: Secondary | ICD-10-CM | POA: Diagnosis not present

## 2020-09-12 DIAGNOSIS — Z6836 Body mass index (BMI) 36.0-36.9, adult: Secondary | ICD-10-CM | POA: Diagnosis not present

## 2020-10-16 DIAGNOSIS — E11621 Type 2 diabetes mellitus with foot ulcer: Secondary | ICD-10-CM | POA: Diagnosis not present

## 2020-10-16 DIAGNOSIS — L97525 Non-pressure chronic ulcer of other part of left foot with muscle involvement without evidence of necrosis: Secondary | ICD-10-CM | POA: Diagnosis not present

## 2020-10-24 DIAGNOSIS — I252 Old myocardial infarction: Secondary | ICD-10-CM | POA: Diagnosis not present

## 2020-10-24 DIAGNOSIS — Z951 Presence of aortocoronary bypass graft: Secondary | ICD-10-CM | POA: Diagnosis not present

## 2020-10-24 DIAGNOSIS — N200 Calculus of kidney: Secondary | ICD-10-CM | POA: Diagnosis not present

## 2020-10-24 DIAGNOSIS — N401 Enlarged prostate with lower urinary tract symptoms: Secondary | ICD-10-CM | POA: Diagnosis not present

## 2020-10-24 DIAGNOSIS — E119 Type 2 diabetes mellitus without complications: Secondary | ICD-10-CM | POA: Diagnosis not present

## 2020-10-24 DIAGNOSIS — R8281 Pyuria: Secondary | ICD-10-CM | POA: Diagnosis not present

## 2020-10-24 DIAGNOSIS — N138 Other obstructive and reflux uropathy: Secondary | ICD-10-CM | POA: Diagnosis not present

## 2020-10-25 DIAGNOSIS — I252 Old myocardial infarction: Secondary | ICD-10-CM | POA: Diagnosis not present

## 2020-10-25 DIAGNOSIS — Z951 Presence of aortocoronary bypass graft: Secondary | ICD-10-CM | POA: Diagnosis not present

## 2020-10-25 DIAGNOSIS — E119 Type 2 diabetes mellitus without complications: Secondary | ICD-10-CM | POA: Diagnosis not present

## 2020-10-26 DIAGNOSIS — N200 Calculus of kidney: Secondary | ICD-10-CM | POA: Diagnosis not present

## 2020-10-26 DIAGNOSIS — N138 Other obstructive and reflux uropathy: Secondary | ICD-10-CM | POA: Diagnosis not present

## 2020-10-26 DIAGNOSIS — R8281 Pyuria: Secondary | ICD-10-CM | POA: Diagnosis not present

## 2020-10-26 DIAGNOSIS — N401 Enlarged prostate with lower urinary tract symptoms: Secondary | ICD-10-CM | POA: Diagnosis not present

## 2020-10-27 DIAGNOSIS — E119 Type 2 diabetes mellitus without complications: Secondary | ICD-10-CM | POA: Diagnosis not present

## 2020-10-27 DIAGNOSIS — I252 Old myocardial infarction: Secondary | ICD-10-CM | POA: Diagnosis not present

## 2020-10-27 DIAGNOSIS — Z951 Presence of aortocoronary bypass graft: Secondary | ICD-10-CM | POA: Diagnosis not present

## 2020-10-30 DIAGNOSIS — E119 Type 2 diabetes mellitus without complications: Secondary | ICD-10-CM | POA: Diagnosis not present

## 2020-10-30 DIAGNOSIS — I252 Old myocardial infarction: Secondary | ICD-10-CM | POA: Diagnosis not present

## 2020-10-30 DIAGNOSIS — Z951 Presence of aortocoronary bypass graft: Secondary | ICD-10-CM | POA: Diagnosis not present

## 2020-11-01 DIAGNOSIS — Z951 Presence of aortocoronary bypass graft: Secondary | ICD-10-CM | POA: Diagnosis not present

## 2020-11-01 DIAGNOSIS — E119 Type 2 diabetes mellitus without complications: Secondary | ICD-10-CM | POA: Diagnosis not present

## 2020-11-01 DIAGNOSIS — I252 Old myocardial infarction: Secondary | ICD-10-CM | POA: Diagnosis not present

## 2020-11-03 DIAGNOSIS — I252 Old myocardial infarction: Secondary | ICD-10-CM | POA: Diagnosis not present

## 2020-11-03 DIAGNOSIS — E785 Hyperlipidemia, unspecified: Secondary | ICD-10-CM | POA: Diagnosis not present

## 2020-11-03 DIAGNOSIS — I82402 Acute embolism and thrombosis of unspecified deep veins of left lower extremity: Secondary | ICD-10-CM | POA: Diagnosis not present

## 2020-11-03 DIAGNOSIS — G63 Polyneuropathy in diseases classified elsewhere: Secondary | ICD-10-CM | POA: Diagnosis not present

## 2020-11-03 DIAGNOSIS — I25119 Atherosclerotic heart disease of native coronary artery with unspecified angina pectoris: Secondary | ICD-10-CM | POA: Diagnosis not present

## 2020-11-03 DIAGNOSIS — R809 Proteinuria, unspecified: Secondary | ICD-10-CM | POA: Diagnosis not present

## 2020-11-03 DIAGNOSIS — E1129 Type 2 diabetes mellitus with other diabetic kidney complication: Secondary | ICD-10-CM | POA: Diagnosis not present

## 2020-11-03 DIAGNOSIS — N1832 Chronic kidney disease, stage 3b: Secondary | ICD-10-CM | POA: Diagnosis not present

## 2020-11-03 DIAGNOSIS — Z951 Presence of aortocoronary bypass graft: Secondary | ICD-10-CM | POA: Diagnosis not present

## 2020-11-03 DIAGNOSIS — Z79899 Other long term (current) drug therapy: Secondary | ICD-10-CM | POA: Diagnosis not present

## 2020-11-03 DIAGNOSIS — E119 Type 2 diabetes mellitus without complications: Secondary | ICD-10-CM | POA: Diagnosis not present

## 2020-11-03 DIAGNOSIS — I10 Essential (primary) hypertension: Secondary | ICD-10-CM | POA: Diagnosis not present

## 2020-11-03 DIAGNOSIS — I5032 Chronic diastolic (congestive) heart failure: Secondary | ICD-10-CM | POA: Diagnosis not present

## 2020-11-06 DIAGNOSIS — E119 Type 2 diabetes mellitus without complications: Secondary | ICD-10-CM | POA: Diagnosis not present

## 2020-11-06 DIAGNOSIS — I252 Old myocardial infarction: Secondary | ICD-10-CM | POA: Diagnosis not present

## 2020-11-06 DIAGNOSIS — Z951 Presence of aortocoronary bypass graft: Secondary | ICD-10-CM | POA: Diagnosis not present

## 2020-11-08 DIAGNOSIS — I82402 Acute embolism and thrombosis of unspecified deep veins of left lower extremity: Secondary | ICD-10-CM | POA: Diagnosis not present

## 2020-11-10 DIAGNOSIS — Z951 Presence of aortocoronary bypass graft: Secondary | ICD-10-CM | POA: Diagnosis not present

## 2020-11-13 DIAGNOSIS — Z951 Presence of aortocoronary bypass graft: Secondary | ICD-10-CM | POA: Diagnosis not present

## 2020-11-15 DIAGNOSIS — Z951 Presence of aortocoronary bypass graft: Secondary | ICD-10-CM | POA: Diagnosis not present

## 2020-11-17 DIAGNOSIS — Z951 Presence of aortocoronary bypass graft: Secondary | ICD-10-CM | POA: Diagnosis not present

## 2020-11-20 DIAGNOSIS — Z951 Presence of aortocoronary bypass graft: Secondary | ICD-10-CM | POA: Diagnosis not present

## 2020-11-22 DIAGNOSIS — Z951 Presence of aortocoronary bypass graft: Secondary | ICD-10-CM | POA: Diagnosis not present

## 2020-11-23 DIAGNOSIS — Z951 Presence of aortocoronary bypass graft: Secondary | ICD-10-CM | POA: Diagnosis not present

## 2020-11-27 DIAGNOSIS — Z951 Presence of aortocoronary bypass graft: Secondary | ICD-10-CM | POA: Diagnosis not present

## 2020-11-29 DIAGNOSIS — Z951 Presence of aortocoronary bypass graft: Secondary | ICD-10-CM | POA: Diagnosis not present

## 2020-12-01 DIAGNOSIS — Z951 Presence of aortocoronary bypass graft: Secondary | ICD-10-CM | POA: Diagnosis not present

## 2020-12-04 DIAGNOSIS — Z951 Presence of aortocoronary bypass graft: Secondary | ICD-10-CM | POA: Diagnosis not present

## 2020-12-05 DIAGNOSIS — E11621 Type 2 diabetes mellitus with foot ulcer: Secondary | ICD-10-CM | POA: Diagnosis not present

## 2020-12-05 DIAGNOSIS — L97525 Non-pressure chronic ulcer of other part of left foot with muscle involvement without evidence of necrosis: Secondary | ICD-10-CM | POA: Diagnosis not present

## 2020-12-06 DIAGNOSIS — Z951 Presence of aortocoronary bypass graft: Secondary | ICD-10-CM | POA: Diagnosis not present

## 2020-12-08 DIAGNOSIS — Z951 Presence of aortocoronary bypass graft: Secondary | ICD-10-CM | POA: Diagnosis not present

## 2020-12-11 DIAGNOSIS — Z955 Presence of coronary angioplasty implant and graft: Secondary | ICD-10-CM | POA: Diagnosis not present

## 2020-12-13 DIAGNOSIS — Z955 Presence of coronary angioplasty implant and graft: Secondary | ICD-10-CM | POA: Diagnosis not present

## 2020-12-18 DIAGNOSIS — Z955 Presence of coronary angioplasty implant and graft: Secondary | ICD-10-CM | POA: Diagnosis not present

## 2020-12-19 DIAGNOSIS — L97525 Non-pressure chronic ulcer of other part of left foot with muscle involvement without evidence of necrosis: Secondary | ICD-10-CM | POA: Diagnosis not present

## 2020-12-19 DIAGNOSIS — E11621 Type 2 diabetes mellitus with foot ulcer: Secondary | ICD-10-CM | POA: Diagnosis not present

## 2020-12-20 DIAGNOSIS — Z955 Presence of coronary angioplasty implant and graft: Secondary | ICD-10-CM | POA: Diagnosis not present

## 2020-12-22 DIAGNOSIS — Z955 Presence of coronary angioplasty implant and graft: Secondary | ICD-10-CM | POA: Diagnosis not present

## 2020-12-25 DIAGNOSIS — Z955 Presence of coronary angioplasty implant and graft: Secondary | ICD-10-CM | POA: Diagnosis not present

## 2020-12-27 DIAGNOSIS — L603 Nail dystrophy: Secondary | ICD-10-CM | POA: Diagnosis not present

## 2020-12-27 DIAGNOSIS — Z955 Presence of coronary angioplasty implant and graft: Secondary | ICD-10-CM | POA: Diagnosis not present

## 2020-12-27 DIAGNOSIS — E119 Type 2 diabetes mellitus without complications: Secondary | ICD-10-CM | POA: Diagnosis not present

## 2020-12-27 DIAGNOSIS — Z89422 Acquired absence of other left toe(s): Secondary | ICD-10-CM | POA: Diagnosis not present

## 2020-12-28 DIAGNOSIS — E1129 Type 2 diabetes mellitus with other diabetic kidney complication: Secondary | ICD-10-CM | POA: Diagnosis not present

## 2020-12-28 DIAGNOSIS — R8281 Pyuria: Secondary | ICD-10-CM | POA: Diagnosis not present

## 2020-12-28 DIAGNOSIS — R8271 Bacteriuria: Secondary | ICD-10-CM | POA: Diagnosis not present

## 2020-12-28 DIAGNOSIS — R5381 Other malaise: Secondary | ICD-10-CM | POA: Diagnosis not present

## 2020-12-28 DIAGNOSIS — R5383 Other fatigue: Secondary | ICD-10-CM | POA: Diagnosis not present

## 2020-12-28 DIAGNOSIS — Z79899 Other long term (current) drug therapy: Secondary | ICD-10-CM | POA: Diagnosis not present

## 2020-12-28 DIAGNOSIS — R809 Proteinuria, unspecified: Secondary | ICD-10-CM | POA: Diagnosis not present

## 2020-12-28 DIAGNOSIS — R0600 Dyspnea, unspecified: Secondary | ICD-10-CM | POA: Diagnosis not present

## 2020-12-29 DIAGNOSIS — Z955 Presence of coronary angioplasty implant and graft: Secondary | ICD-10-CM | POA: Diagnosis not present

## 2021-01-05 DIAGNOSIS — Z955 Presence of coronary angioplasty implant and graft: Secondary | ICD-10-CM | POA: Diagnosis not present

## 2021-01-09 DIAGNOSIS — R8281 Pyuria: Secondary | ICD-10-CM | POA: Diagnosis not present

## 2021-01-09 DIAGNOSIS — R809 Proteinuria, unspecified: Secondary | ICD-10-CM | POA: Diagnosis not present

## 2021-01-09 DIAGNOSIS — H00013 Hordeolum externum right eye, unspecified eyelid: Secondary | ICD-10-CM | POA: Diagnosis not present

## 2021-01-09 DIAGNOSIS — E1129 Type 2 diabetes mellitus with other diabetic kidney complication: Secondary | ICD-10-CM | POA: Diagnosis not present

## 2021-01-09 DIAGNOSIS — R8271 Bacteriuria: Secondary | ICD-10-CM | POA: Diagnosis not present

## 2021-01-10 DIAGNOSIS — Z951 Presence of aortocoronary bypass graft: Secondary | ICD-10-CM | POA: Diagnosis not present

## 2021-01-10 DIAGNOSIS — E119 Type 2 diabetes mellitus without complications: Secondary | ICD-10-CM | POA: Diagnosis not present

## 2021-01-10 DIAGNOSIS — I252 Old myocardial infarction: Secondary | ICD-10-CM | POA: Diagnosis not present

## 2021-01-12 DIAGNOSIS — Z951 Presence of aortocoronary bypass graft: Secondary | ICD-10-CM | POA: Diagnosis not present

## 2021-01-12 DIAGNOSIS — E119 Type 2 diabetes mellitus without complications: Secondary | ICD-10-CM | POA: Diagnosis not present

## 2021-01-12 DIAGNOSIS — I252 Old myocardial infarction: Secondary | ICD-10-CM | POA: Diagnosis not present

## 2021-01-15 DIAGNOSIS — Z951 Presence of aortocoronary bypass graft: Secondary | ICD-10-CM | POA: Diagnosis not present

## 2021-01-15 DIAGNOSIS — I252 Old myocardial infarction: Secondary | ICD-10-CM | POA: Diagnosis not present

## 2021-01-15 DIAGNOSIS — E119 Type 2 diabetes mellitus without complications: Secondary | ICD-10-CM | POA: Diagnosis not present

## 2021-01-16 DIAGNOSIS — N200 Calculus of kidney: Secondary | ICD-10-CM | POA: Diagnosis not present

## 2021-01-16 DIAGNOSIS — R319 Hematuria, unspecified: Secondary | ICD-10-CM | POA: Diagnosis not present

## 2021-01-16 DIAGNOSIS — N390 Urinary tract infection, site not specified: Secondary | ICD-10-CM | POA: Diagnosis not present

## 2021-01-16 DIAGNOSIS — R310 Gross hematuria: Secondary | ICD-10-CM | POA: Diagnosis not present

## 2021-01-16 DIAGNOSIS — N399 Disorder of urinary system, unspecified: Secondary | ICD-10-CM | POA: Diagnosis not present

## 2021-01-18 DIAGNOSIS — N200 Calculus of kidney: Secondary | ICD-10-CM | POA: Diagnosis not present

## 2021-01-18 DIAGNOSIS — R310 Gross hematuria: Secondary | ICD-10-CM | POA: Diagnosis not present

## 2021-01-18 DIAGNOSIS — R319 Hematuria, unspecified: Secondary | ICD-10-CM | POA: Diagnosis not present

## 2021-01-18 DIAGNOSIS — N390 Urinary tract infection, site not specified: Secondary | ICD-10-CM | POA: Diagnosis not present

## 2021-01-19 DIAGNOSIS — I252 Old myocardial infarction: Secondary | ICD-10-CM | POA: Diagnosis not present

## 2021-01-19 DIAGNOSIS — E119 Type 2 diabetes mellitus without complications: Secondary | ICD-10-CM | POA: Diagnosis not present

## 2021-01-19 DIAGNOSIS — Z951 Presence of aortocoronary bypass graft: Secondary | ICD-10-CM | POA: Diagnosis not present

## 2021-01-22 DIAGNOSIS — E119 Type 2 diabetes mellitus without complications: Secondary | ICD-10-CM | POA: Diagnosis not present

## 2021-01-22 DIAGNOSIS — I252 Old myocardial infarction: Secondary | ICD-10-CM | POA: Diagnosis not present

## 2021-01-22 DIAGNOSIS — Z951 Presence of aortocoronary bypass graft: Secondary | ICD-10-CM | POA: Diagnosis not present

## 2021-01-31 DIAGNOSIS — E1129 Type 2 diabetes mellitus with other diabetic kidney complication: Secondary | ICD-10-CM | POA: Diagnosis not present

## 2021-01-31 DIAGNOSIS — R809 Proteinuria, unspecified: Secondary | ICD-10-CM | POA: Diagnosis not present

## 2021-02-15 DIAGNOSIS — E1129 Type 2 diabetes mellitus with other diabetic kidney complication: Secondary | ICD-10-CM | POA: Diagnosis not present

## 2021-02-15 DIAGNOSIS — R809 Proteinuria, unspecified: Secondary | ICD-10-CM | POA: Diagnosis not present

## 2021-02-15 DIAGNOSIS — N1832 Chronic kidney disease, stage 3b: Secondary | ICD-10-CM | POA: Diagnosis not present

## 2021-03-05 DIAGNOSIS — L97525 Non-pressure chronic ulcer of other part of left foot with muscle involvement without evidence of necrosis: Secondary | ICD-10-CM | POA: Diagnosis not present

## 2021-03-12 DIAGNOSIS — I70213 Atherosclerosis of native arteries of extremities with intermittent claudication, bilateral legs: Secondary | ICD-10-CM | POA: Diagnosis not present

## 2021-03-12 DIAGNOSIS — Z951 Presence of aortocoronary bypass graft: Secondary | ICD-10-CM | POA: Diagnosis not present

## 2021-03-12 DIAGNOSIS — R809 Proteinuria, unspecified: Secondary | ICD-10-CM | POA: Diagnosis not present

## 2021-03-12 DIAGNOSIS — N1832 Chronic kidney disease, stage 3b: Secondary | ICD-10-CM | POA: Diagnosis not present

## 2021-03-12 DIAGNOSIS — I5032 Chronic diastolic (congestive) heart failure: Secondary | ICD-10-CM | POA: Diagnosis not present

## 2021-03-12 DIAGNOSIS — I25119 Atherosclerotic heart disease of native coronary artery with unspecified angina pectoris: Secondary | ICD-10-CM | POA: Diagnosis not present

## 2021-03-12 DIAGNOSIS — E1129 Type 2 diabetes mellitus with other diabetic kidney complication: Secondary | ICD-10-CM | POA: Diagnosis not present

## 2021-03-12 DIAGNOSIS — Z0000 Encounter for general adult medical examination without abnormal findings: Secondary | ICD-10-CM | POA: Diagnosis not present

## 2021-03-12 DIAGNOSIS — E785 Hyperlipidemia, unspecified: Secondary | ICD-10-CM | POA: Diagnosis not present

## 2021-03-12 DIAGNOSIS — Z79899 Other long term (current) drug therapy: Secondary | ICD-10-CM | POA: Diagnosis not present

## 2021-03-14 DIAGNOSIS — H2513 Age-related nuclear cataract, bilateral: Secondary | ICD-10-CM | POA: Diagnosis not present

## 2021-03-14 DIAGNOSIS — E119 Type 2 diabetes mellitus without complications: Secondary | ICD-10-CM | POA: Diagnosis not present

## 2021-03-19 DIAGNOSIS — E11621 Type 2 diabetes mellitus with foot ulcer: Secondary | ICD-10-CM | POA: Diagnosis not present

## 2021-03-19 DIAGNOSIS — L97525 Non-pressure chronic ulcer of other part of left foot with muscle involvement without evidence of necrosis: Secondary | ICD-10-CM | POA: Diagnosis not present

## 2021-04-02 DIAGNOSIS — Z794 Long term (current) use of insulin: Secondary | ICD-10-CM | POA: Diagnosis not present

## 2021-04-02 DIAGNOSIS — E119 Type 2 diabetes mellitus without complications: Secondary | ICD-10-CM | POA: Diagnosis not present

## 2021-04-02 DIAGNOSIS — L603 Nail dystrophy: Secondary | ICD-10-CM | POA: Diagnosis not present

## 2021-04-02 DIAGNOSIS — Z89422 Acquired absence of other left toe(s): Secondary | ICD-10-CM | POA: Diagnosis not present

## 2021-04-17 DIAGNOSIS — L97525 Non-pressure chronic ulcer of other part of left foot with muscle involvement without evidence of necrosis: Secondary | ICD-10-CM | POA: Diagnosis not present

## 2021-04-17 DIAGNOSIS — E11621 Type 2 diabetes mellitus with foot ulcer: Secondary | ICD-10-CM | POA: Diagnosis not present

## 2021-05-08 DIAGNOSIS — E11621 Type 2 diabetes mellitus with foot ulcer: Secondary | ICD-10-CM | POA: Diagnosis not present

## 2021-05-08 DIAGNOSIS — L97525 Non-pressure chronic ulcer of other part of left foot with muscle involvement without evidence of necrosis: Secondary | ICD-10-CM | POA: Diagnosis not present

## 2021-05-29 DIAGNOSIS — E11621 Type 2 diabetes mellitus with foot ulcer: Secondary | ICD-10-CM | POA: Diagnosis not present

## 2021-05-29 DIAGNOSIS — L97525 Non-pressure chronic ulcer of other part of left foot with muscle involvement without evidence of necrosis: Secondary | ICD-10-CM | POA: Diagnosis not present

## 2021-06-11 DIAGNOSIS — E785 Hyperlipidemia, unspecified: Secondary | ICD-10-CM | POA: Diagnosis not present

## 2021-06-11 DIAGNOSIS — I10 Essential (primary) hypertension: Secondary | ICD-10-CM | POA: Diagnosis not present

## 2021-07-04 DIAGNOSIS — L603 Nail dystrophy: Secondary | ICD-10-CM | POA: Diagnosis not present

## 2021-07-04 DIAGNOSIS — Z794 Long term (current) use of insulin: Secondary | ICD-10-CM | POA: Diagnosis not present

## 2021-07-04 DIAGNOSIS — Z89422 Acquired absence of other left toe(s): Secondary | ICD-10-CM | POA: Diagnosis not present

## 2021-07-04 DIAGNOSIS — E119 Type 2 diabetes mellitus without complications: Secondary | ICD-10-CM | POA: Diagnosis not present

## 2021-07-11 DIAGNOSIS — E1129 Type 2 diabetes mellitus with other diabetic kidney complication: Secondary | ICD-10-CM | POA: Diagnosis not present

## 2021-07-11 DIAGNOSIS — E785 Hyperlipidemia, unspecified: Secondary | ICD-10-CM | POA: Diagnosis not present

## 2021-07-13 DIAGNOSIS — N200 Calculus of kidney: Secondary | ICD-10-CM | POA: Diagnosis not present

## 2021-07-19 DIAGNOSIS — G63 Polyneuropathy in diseases classified elsewhere: Secondary | ICD-10-CM | POA: Diagnosis not present

## 2021-07-19 DIAGNOSIS — E1129 Type 2 diabetes mellitus with other diabetic kidney complication: Secondary | ICD-10-CM | POA: Diagnosis not present

## 2021-07-19 DIAGNOSIS — N1832 Chronic kidney disease, stage 3b: Secondary | ICD-10-CM | POA: Diagnosis not present

## 2021-07-19 DIAGNOSIS — E785 Hyperlipidemia, unspecified: Secondary | ICD-10-CM | POA: Diagnosis not present

## 2021-07-19 DIAGNOSIS — I5032 Chronic diastolic (congestive) heart failure: Secondary | ICD-10-CM | POA: Diagnosis not present

## 2021-07-19 DIAGNOSIS — I25119 Atherosclerotic heart disease of native coronary artery with unspecified angina pectoris: Secondary | ICD-10-CM | POA: Diagnosis not present

## 2021-07-19 DIAGNOSIS — Z951 Presence of aortocoronary bypass graft: Secondary | ICD-10-CM | POA: Diagnosis not present

## 2021-07-19 DIAGNOSIS — R809 Proteinuria, unspecified: Secondary | ICD-10-CM | POA: Diagnosis not present

## 2021-07-19 DIAGNOSIS — I10 Essential (primary) hypertension: Secondary | ICD-10-CM | POA: Diagnosis not present

## 2021-07-19 DIAGNOSIS — I70213 Atherosclerosis of native arteries of extremities with intermittent claudication, bilateral legs: Secondary | ICD-10-CM | POA: Diagnosis not present

## 2021-08-10 DIAGNOSIS — E785 Hyperlipidemia, unspecified: Secondary | ICD-10-CM | POA: Diagnosis not present

## 2021-08-10 DIAGNOSIS — E1129 Type 2 diabetes mellitus with other diabetic kidney complication: Secondary | ICD-10-CM | POA: Diagnosis not present

## 2021-09-11 DIAGNOSIS — E785 Hyperlipidemia, unspecified: Secondary | ICD-10-CM | POA: Diagnosis not present

## 2021-09-11 DIAGNOSIS — I10 Essential (primary) hypertension: Secondary | ICD-10-CM | POA: Diagnosis not present

## 2021-11-06 DIAGNOSIS — I80201 Phlebitis and thrombophlebitis of unspecified deep vessels of right lower extremity: Secondary | ICD-10-CM | POA: Diagnosis not present

## 2021-11-12 DIAGNOSIS — L603 Nail dystrophy: Secondary | ICD-10-CM | POA: Diagnosis not present

## 2021-11-12 DIAGNOSIS — E1140 Type 2 diabetes mellitus with diabetic neuropathy, unspecified: Secondary | ICD-10-CM | POA: Diagnosis not present

## 2021-11-12 DIAGNOSIS — Z89422 Acquired absence of other left toe(s): Secondary | ICD-10-CM | POA: Diagnosis not present

## 2021-11-13 DIAGNOSIS — S81801A Unspecified open wound, right lower leg, initial encounter: Secondary | ICD-10-CM | POA: Diagnosis not present

## 2021-11-13 DIAGNOSIS — I872 Venous insufficiency (chronic) (peripheral): Secondary | ICD-10-CM | POA: Diagnosis not present

## 2021-11-21 DIAGNOSIS — I5032 Chronic diastolic (congestive) heart failure: Secondary | ICD-10-CM | POA: Diagnosis not present

## 2021-11-21 DIAGNOSIS — E1129 Type 2 diabetes mellitus with other diabetic kidney complication: Secondary | ICD-10-CM | POA: Diagnosis not present

## 2021-11-21 DIAGNOSIS — L97919 Non-pressure chronic ulcer of unspecified part of right lower leg with unspecified severity: Secondary | ICD-10-CM | POA: Diagnosis not present

## 2021-11-21 DIAGNOSIS — I83019 Varicose veins of right lower extremity with ulcer of unspecified site: Secondary | ICD-10-CM | POA: Diagnosis not present

## 2021-11-21 DIAGNOSIS — E785 Hyperlipidemia, unspecified: Secondary | ICD-10-CM | POA: Diagnosis not present

## 2021-11-21 DIAGNOSIS — R809 Proteinuria, unspecified: Secondary | ICD-10-CM | POA: Diagnosis not present

## 2021-11-21 DIAGNOSIS — I70213 Atherosclerosis of native arteries of extremities with intermittent claudication, bilateral legs: Secondary | ICD-10-CM | POA: Diagnosis not present

## 2021-11-21 DIAGNOSIS — I25119 Atherosclerotic heart disease of native coronary artery with unspecified angina pectoris: Secondary | ICD-10-CM | POA: Diagnosis not present

## 2021-11-21 DIAGNOSIS — N1832 Chronic kidney disease, stage 3b: Secondary | ICD-10-CM | POA: Diagnosis not present

## 2021-11-21 DIAGNOSIS — G63 Polyneuropathy in diseases classified elsewhere: Secondary | ICD-10-CM | POA: Diagnosis not present

## 2021-11-26 DIAGNOSIS — I83019 Varicose veins of right lower extremity with ulcer of unspecified site: Secondary | ICD-10-CM | POA: Diagnosis not present

## 2021-11-26 DIAGNOSIS — L97919 Non-pressure chronic ulcer of unspecified part of right lower leg with unspecified severity: Secondary | ICD-10-CM | POA: Diagnosis not present

## 2021-11-28 IMAGING — DX DG CHEST 1V PORT
1 series · 1 of 1 positions shown · non-contrast
Comparison: June 18, 2020.

CLINICAL DATA: Orogastric tube placement.

EXAM:
PORTABLE CHEST 1 VIEW

[chest]
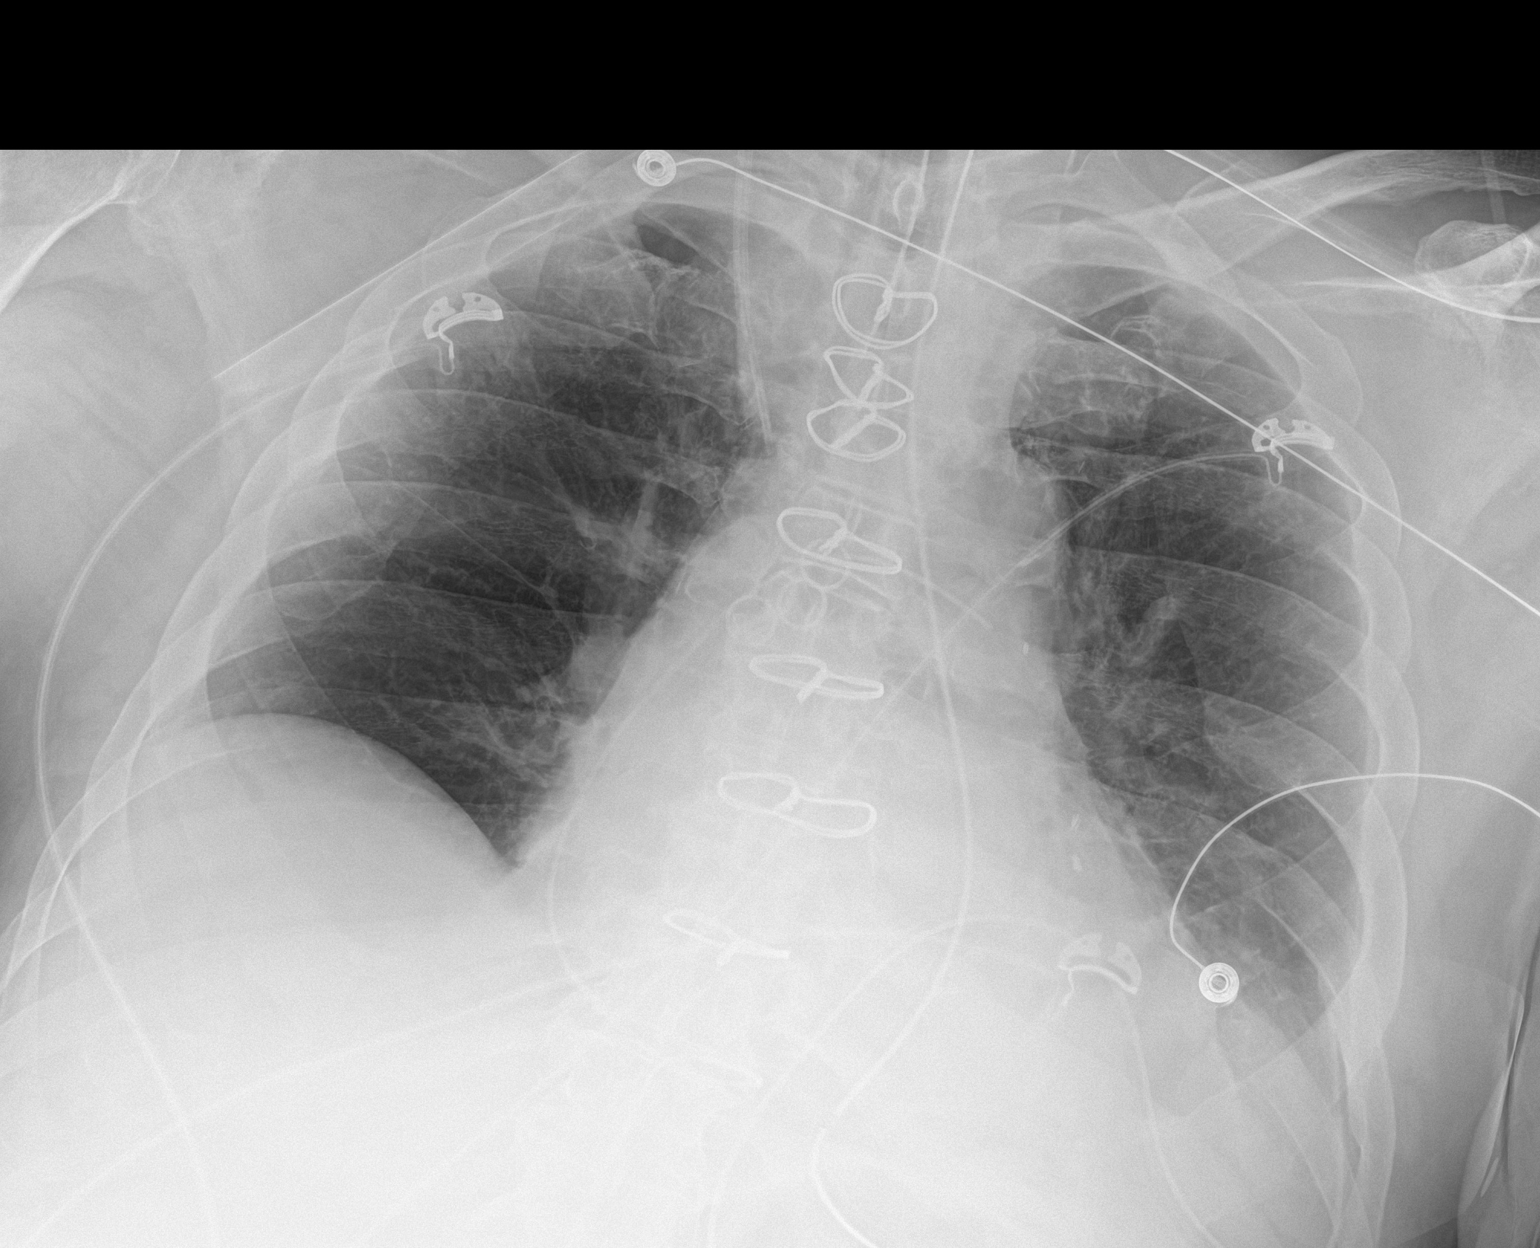

[1 of 1 positions shown; findings below may reference images not displayed]

FINDINGS: Stable cardiomediastinal silhouette. Status post coronary bypass
graft. Endotracheal and nasogastric tubes appear to be in grossly
good position. Right internal jugular catheter is unchanged. Right
lung is clear. No definite pneumothorax is noted. Mild left basilar
atelectasis is noted with associated pleural effusion. Bony thorax
is unremarkable.
IMPRESSION: Endotracheal and nasogastric tubes in grossly good position. Mild
left basilar atelectasis is noted with associated pleural effusion.
No definite pneumothorax is noted.

## 2021-11-29 IMAGING — DX DG CHEST 1V PORT
1 series · 1 of 1 positions shown · non-contrast
Comparison: Yesterday

CLINICAL DATA: Open-heart surgery with sore chest

EXAM:
PORTABLE CHEST 1 VIEW

[chest]
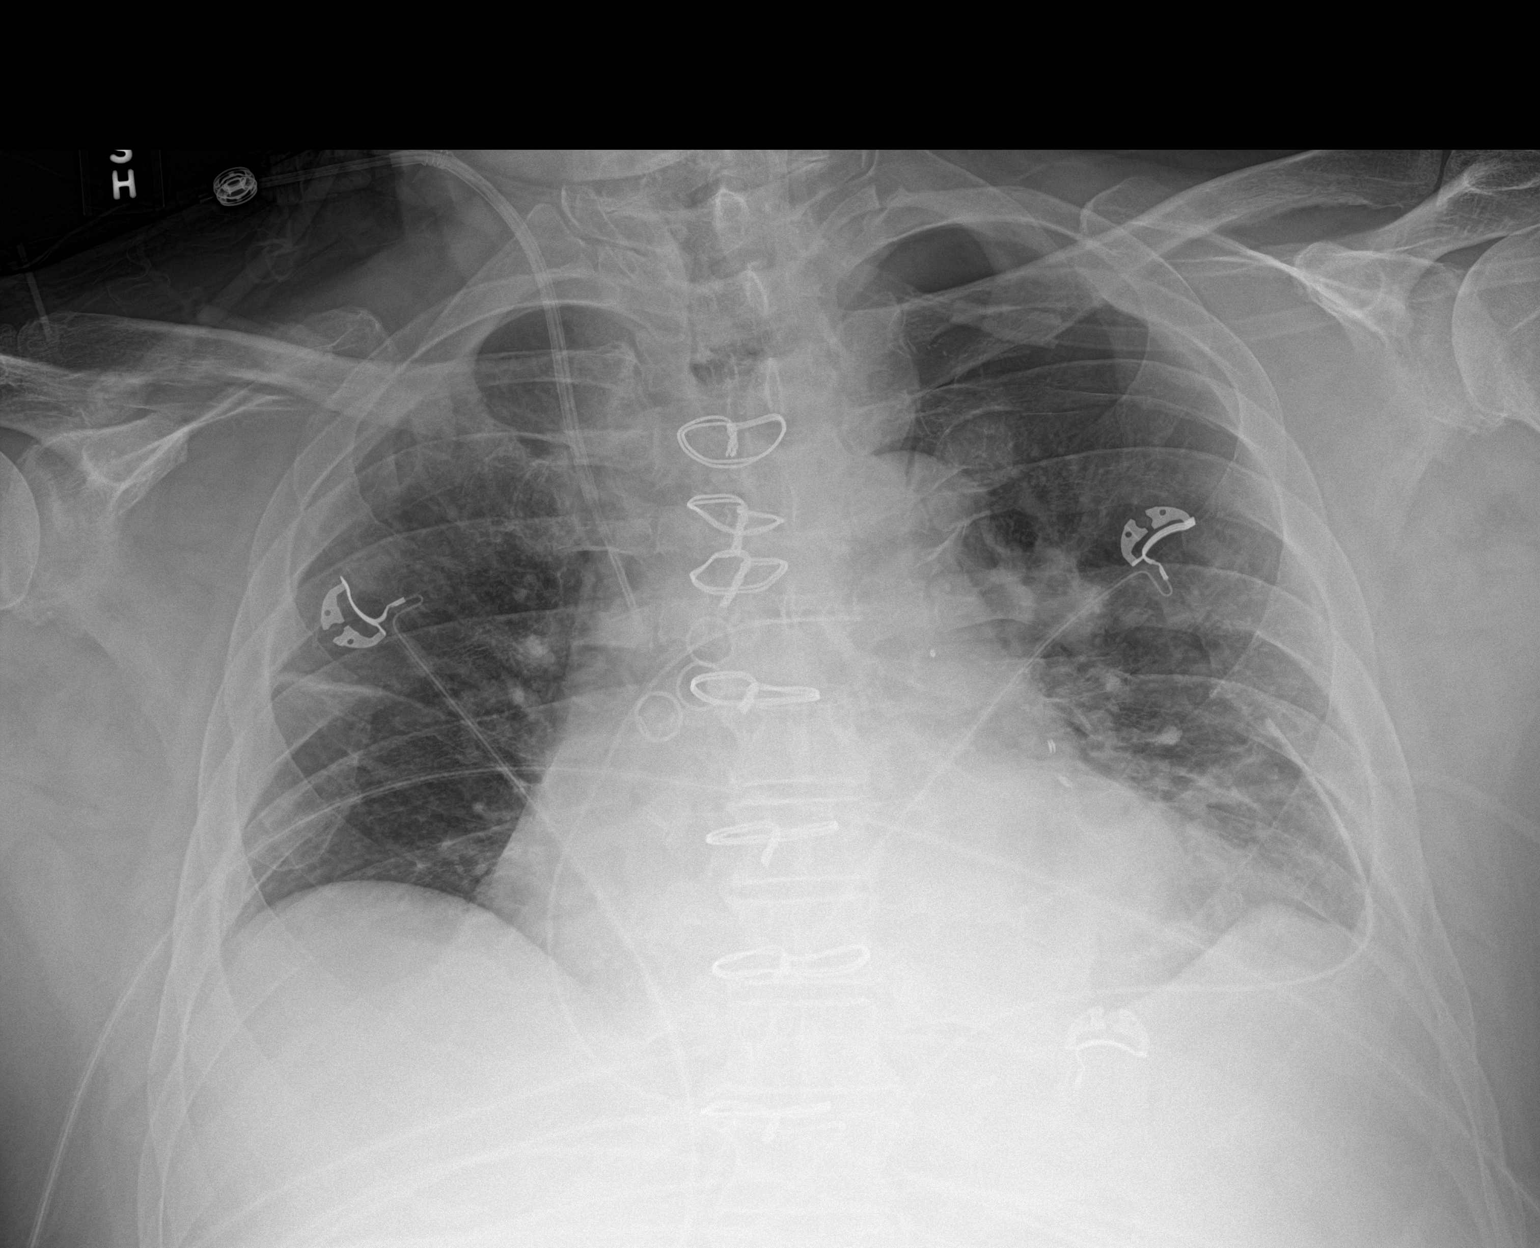

[1 of 1 positions shown; findings below may reference images not displayed]

FINDINGS: Extubation with stable atelectatic type opacity. CABG and
cardiomegaly. Right IJ line with tip at the SVC. New left apical
pneumothorax which is small to moderate, measuring 5.2 cm in
thickness. Left chest tube is in place.
IMPRESSION: 1. New left apical pneumothorax with basal chest tube in place.
2. Stable atelectasis after extubation.

## 2021-12-01 IMAGING — DX DG CHEST 1V PORT
1 series · 1 of 1 positions shown · non-contrast
Comparison: June 21, 2020.

CLINICAL DATA: Pneumothorax.

EXAM:
PORTABLE CHEST 1 VIEW

[chest ap]
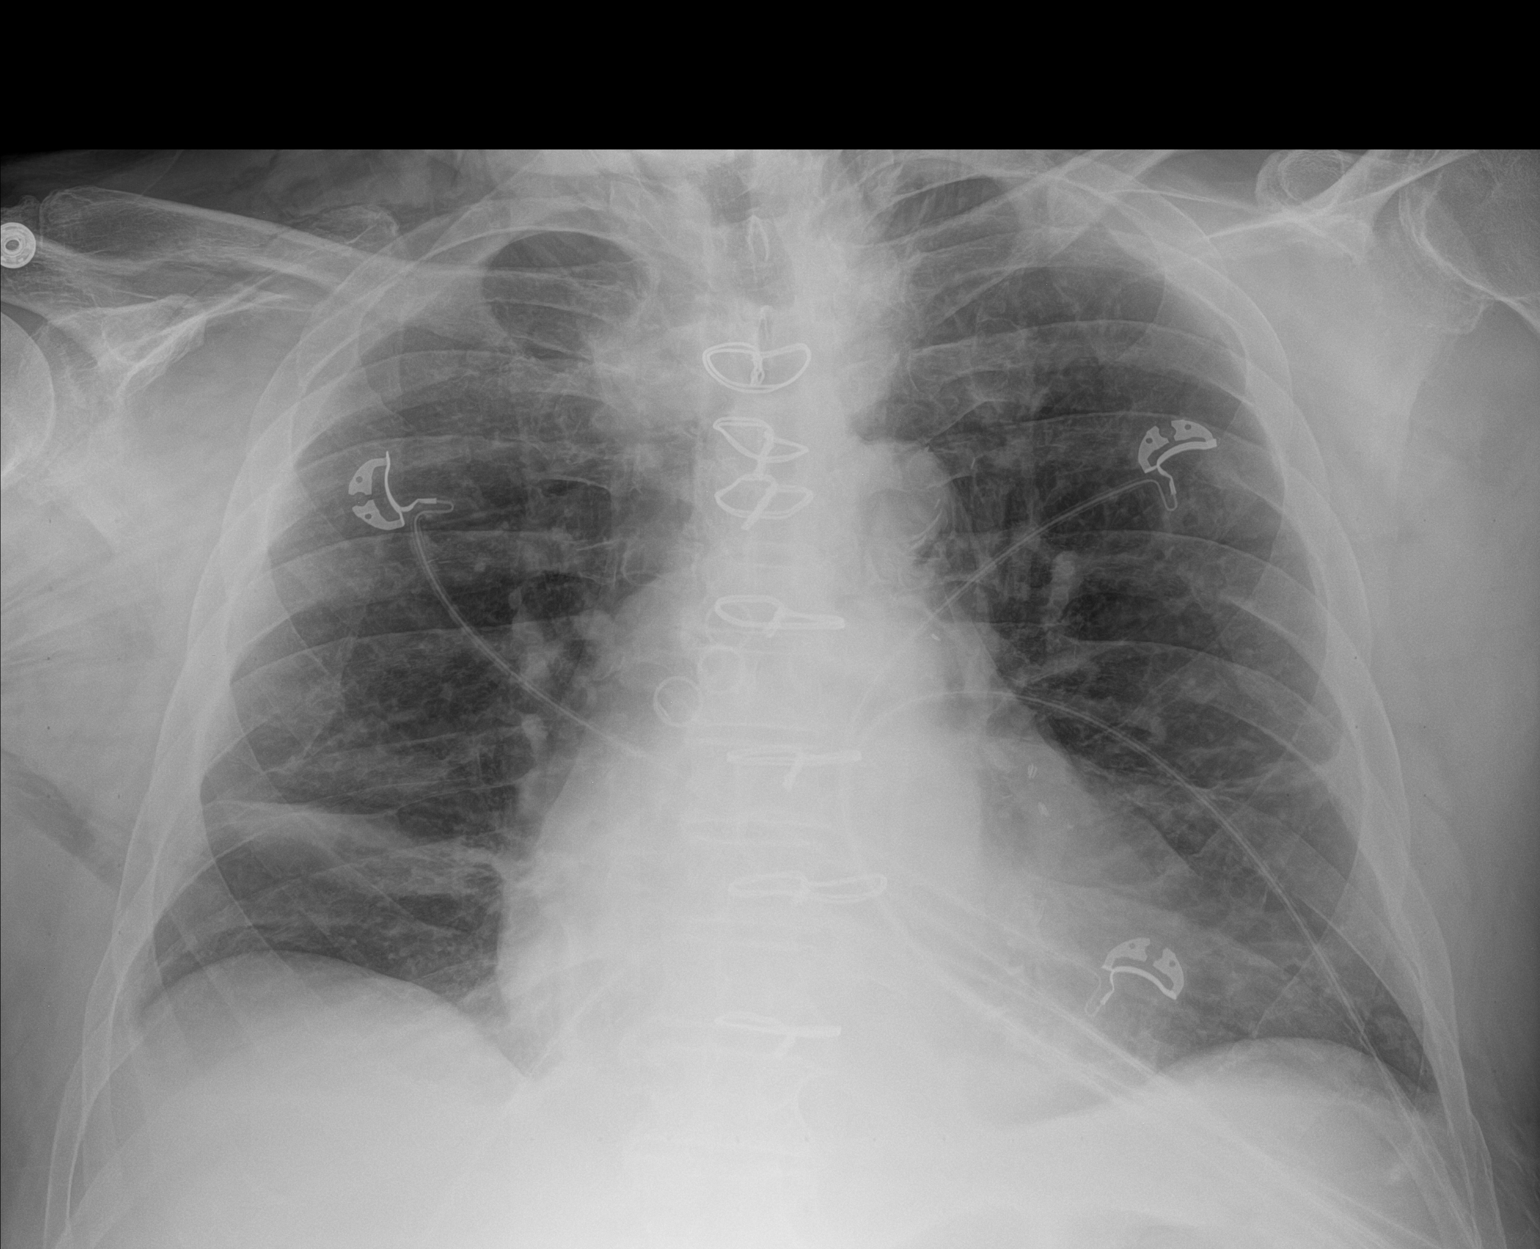

[1 of 1 positions shown; findings below may reference images not displayed]

FINDINGS: Stable cardiomediastinal silhouette. Status post coronary bypass
graft. Small left apical pneumothorax is noted which is
significantly decreased compared to prior exam. Bilateral
supraclavicular subcutaneous emphysema is noted. Bibasilar
subsegmental atelectasis is noted. Left-sided chest tube is no
longer visualized. Small left pleural effusion may be present. Bony
thorax is unremarkable.
IMPRESSION: Small left apical pneumothorax is noted which is significantly
decreased compared to prior exam. Left-sided chest tube is no longer
visualized. Bibasilar subsegmental atelectasis is noted. Bilateral
supraclavicular subcutaneous emphysema is noted.

## 2021-12-25 DIAGNOSIS — L259 Unspecified contact dermatitis, unspecified cause: Secondary | ICD-10-CM | POA: Diagnosis not present

## 2022-01-27 IMAGING — DX DG CHEST 2V
2 series · 2 of 2 positions shown · non-contrast
Comparison: Radiograph 06/22/2020

CLINICAL DATA: CABG 4 weeks prior.

EXAM:
CHEST - 2 VIEW

[dg chest 2 view (1 of 2)]
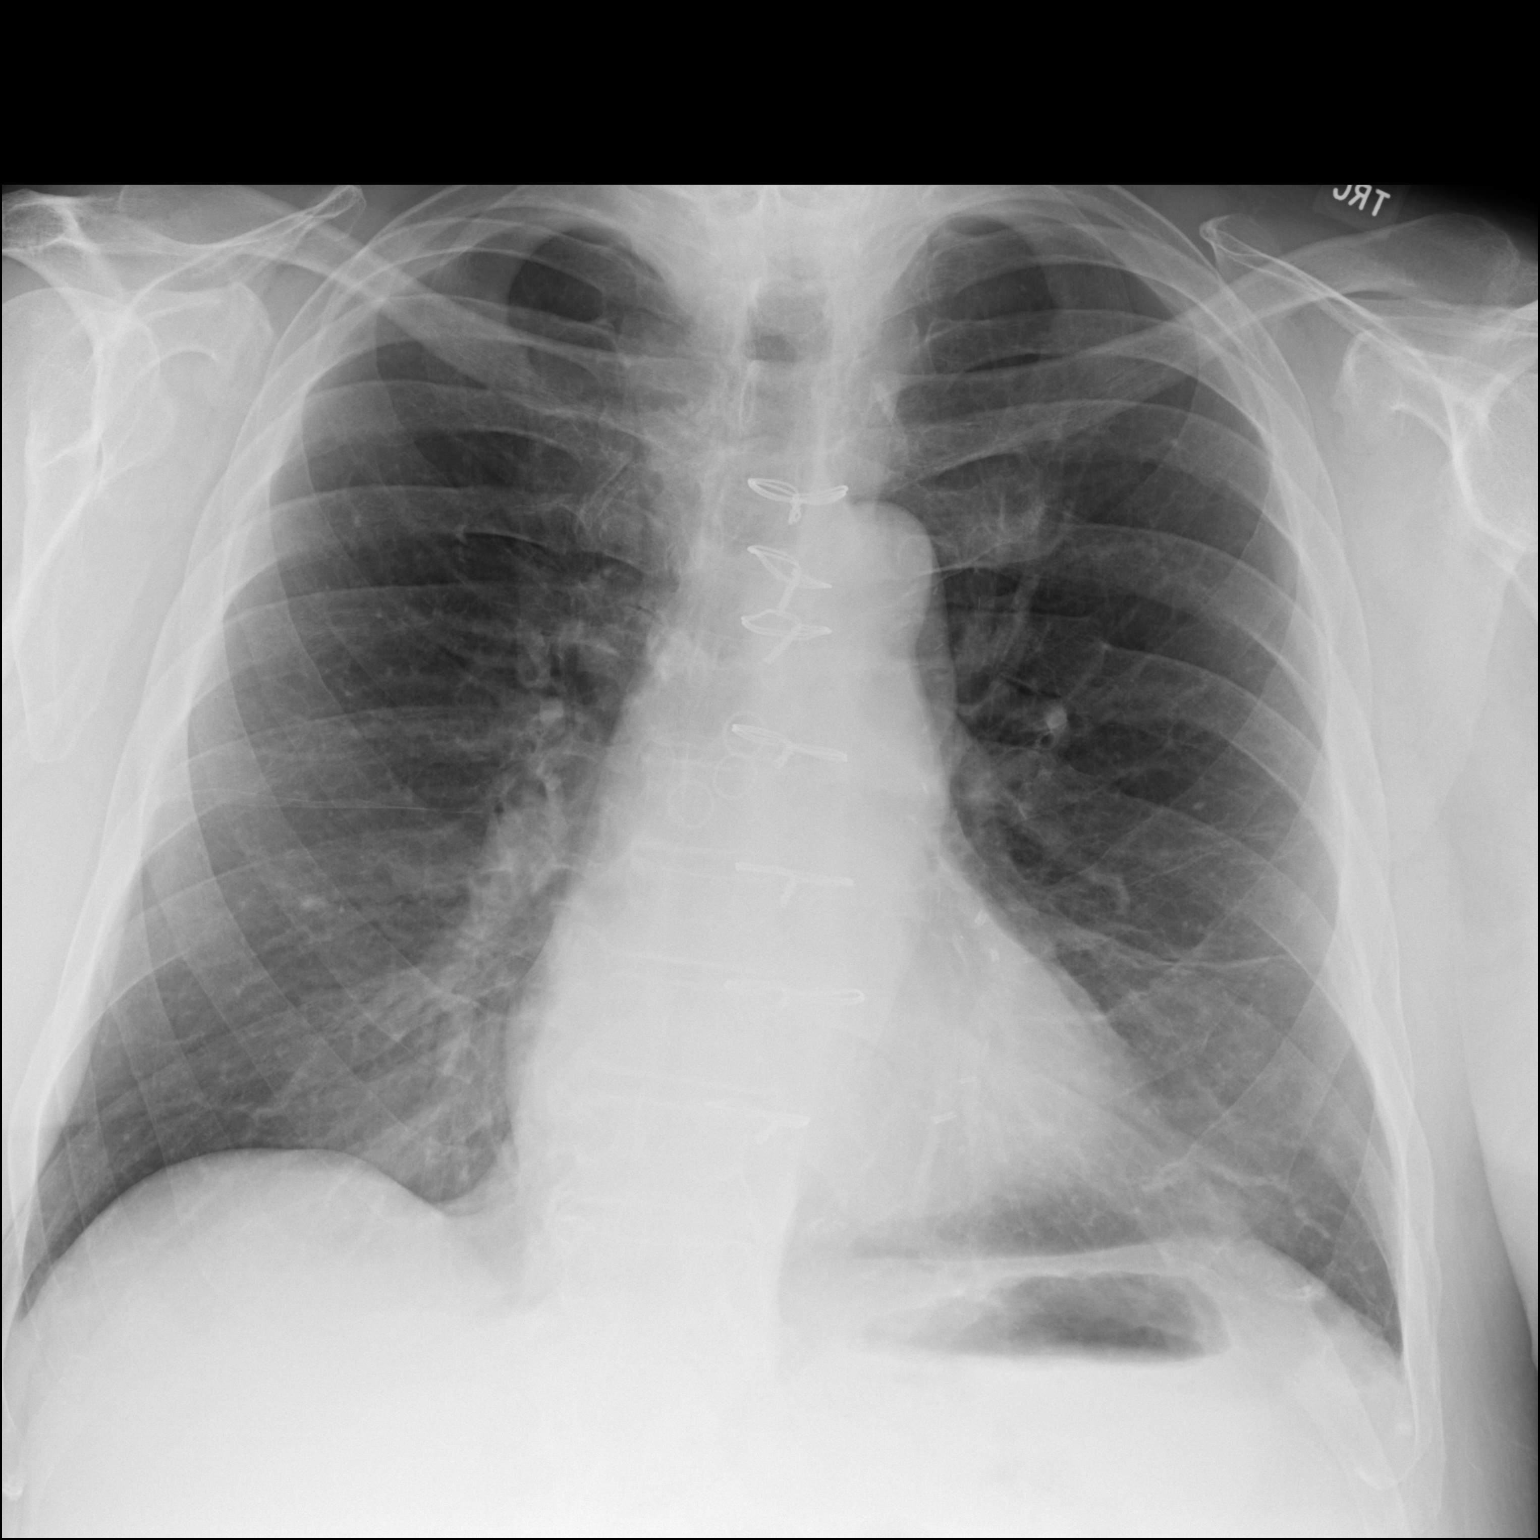

[dg chest 2 view (2 of 2)]
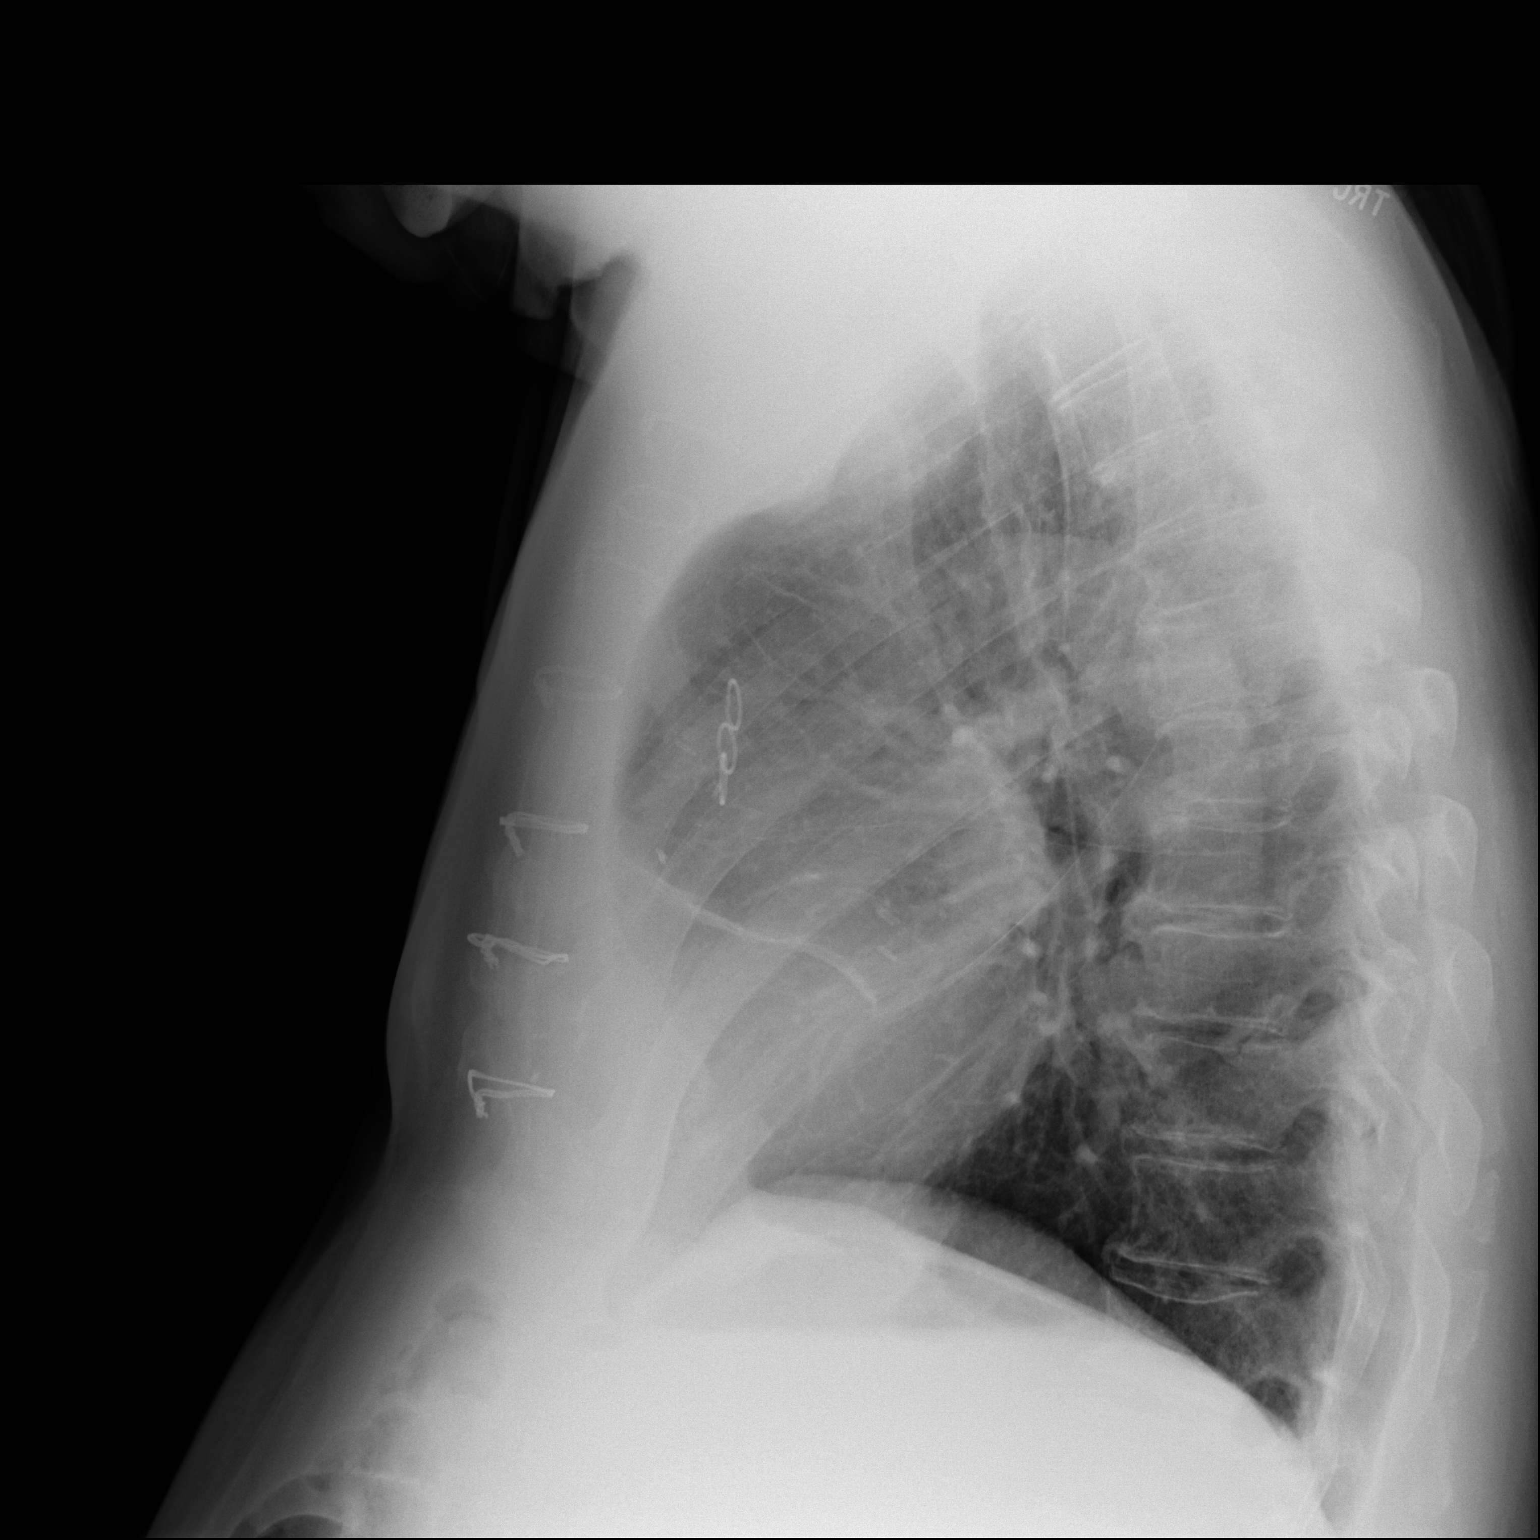

[2 of 2 positions shown; findings below may reference images not displayed]

FINDINGS: Sternotomy wires overlie normal cardiac silhouette. Lungs are well
aerated. Resolution of atelectasis seen on comparison exam.
Resolution of small LEFT apical pneumothorax identified on
comparison exam. No acute osseous abnormality.
IMPRESSION: No acute cardiopulmonary process.

## 2022-03-18 DIAGNOSIS — Z89422 Acquired absence of other left toe(s): Secondary | ICD-10-CM | POA: Diagnosis not present

## 2022-03-18 DIAGNOSIS — E119 Type 2 diabetes mellitus without complications: Secondary | ICD-10-CM | POA: Diagnosis not present

## 2022-03-18 DIAGNOSIS — L602 Onychogryphosis: Secondary | ICD-10-CM | POA: Diagnosis not present

## 2022-03-18 DIAGNOSIS — L603 Nail dystrophy: Secondary | ICD-10-CM | POA: Diagnosis not present

## 2022-03-18 DIAGNOSIS — Z794 Long term (current) use of insulin: Secondary | ICD-10-CM | POA: Diagnosis not present

## 2022-04-30 DIAGNOSIS — H2513 Age-related nuclear cataract, bilateral: Secondary | ICD-10-CM | POA: Diagnosis not present

## 2022-04-30 DIAGNOSIS — E119 Type 2 diabetes mellitus without complications: Secondary | ICD-10-CM | POA: Diagnosis not present

## 2022-06-06 ENCOUNTER — Encounter (HOSPITAL_COMMUNITY)
Admission: EM | Disposition: A | Discharge status: Home / Self Care | Payer: Self-pay | Source: Ambulatory Visit | Attending: Cardiovascular Disease

## 2022-06-06 ENCOUNTER — Inpatient Hospital Stay (HOSPITAL_COMMUNITY)
Admission: EM | Admit: 2022-06-06 | Discharge: 2022-06-09 | DRG: 280 | Disposition: A | Discharge status: Home / Self Care | Payer: Medicare HMO | Source: Ambulatory Visit | Attending: Internal Medicine | Admitting: Internal Medicine

## 2022-06-06 DIAGNOSIS — T82855A Stenosis of coronary artery stent, initial encounter: Secondary | ICD-10-CM | POA: Diagnosis present

## 2022-06-06 DIAGNOSIS — Z8249 Family history of ischemic heart disease and other diseases of the circulatory system: Secondary | ICD-10-CM

## 2022-06-06 DIAGNOSIS — I255 Ischemic cardiomyopathy: Secondary | ICD-10-CM | POA: Diagnosis present

## 2022-06-06 DIAGNOSIS — I10 Essential (primary) hypertension: Secondary | ICD-10-CM | POA: Diagnosis not present

## 2022-06-06 DIAGNOSIS — F17290 Nicotine dependence, other tobacco product, uncomplicated: Secondary | ICD-10-CM | POA: Diagnosis present

## 2022-06-06 DIAGNOSIS — I2510 Atherosclerotic heart disease of native coronary artery without angina pectoris: Secondary | ICD-10-CM | POA: Diagnosis not present

## 2022-06-06 DIAGNOSIS — E119 Type 2 diabetes mellitus without complications: Secondary | ICD-10-CM

## 2022-06-06 DIAGNOSIS — I25810 Atherosclerosis of coronary artery bypass graft(s) without angina pectoris: Secondary | ICD-10-CM | POA: Diagnosis present

## 2022-06-06 DIAGNOSIS — E1122 Type 2 diabetes mellitus with diabetic chronic kidney disease: Secondary | ICD-10-CM | POA: Diagnosis present

## 2022-06-06 DIAGNOSIS — I252 Old myocardial infarction: Secondary | ICD-10-CM

## 2022-06-06 DIAGNOSIS — Z7982 Long term (current) use of aspirin: Secondary | ICD-10-CM

## 2022-06-06 DIAGNOSIS — Z79899 Other long term (current) drug therapy: Secondary | ICD-10-CM

## 2022-06-06 DIAGNOSIS — Z7984 Long term (current) use of oral hypoglycemic drugs: Secondary | ICD-10-CM

## 2022-06-06 DIAGNOSIS — Z743 Need for continuous supervision: Secondary | ICD-10-CM | POA: Diagnosis not present

## 2022-06-06 DIAGNOSIS — I5021 Acute systolic (congestive) heart failure: Secondary | ICD-10-CM | POA: Diagnosis present

## 2022-06-06 DIAGNOSIS — R079 Chest pain, unspecified: Secondary | ICD-10-CM | POA: Diagnosis not present

## 2022-06-06 DIAGNOSIS — I130 Hypertensive heart and chronic kidney disease with heart failure and stage 1 through stage 4 chronic kidney disease, or unspecified chronic kidney disease: Secondary | ICD-10-CM | POA: Diagnosis present

## 2022-06-06 DIAGNOSIS — I878 Other specified disorders of veins: Secondary | ICD-10-CM | POA: Diagnosis present

## 2022-06-06 DIAGNOSIS — E785 Hyperlipidemia, unspecified: Secondary | ICD-10-CM | POA: Diagnosis present

## 2022-06-06 DIAGNOSIS — R001 Bradycardia, unspecified: Secondary | ICD-10-CM | POA: Diagnosis present

## 2022-06-06 DIAGNOSIS — N1832 Chronic kidney disease, stage 3b: Secondary | ICD-10-CM | POA: Diagnosis present

## 2022-06-06 DIAGNOSIS — Y832 Surgical operation with anastomosis, bypass or graft as the cause of abnormal reaction of the patient, or of later complication, without mention of misadventure at the time of the procedure: Secondary | ICD-10-CM | POA: Diagnosis present

## 2022-06-06 DIAGNOSIS — I2119 ST elevation (STEMI) myocardial infarction involving other coronary artery of inferior wall: Secondary | ICD-10-CM | POA: Diagnosis not present

## 2022-06-06 DIAGNOSIS — Z951 Presence of aortocoronary bypass graft: Secondary | ICD-10-CM | POA: Diagnosis not present

## 2022-06-06 DIAGNOSIS — I499 Cardiac arrhythmia, unspecified: Secondary | ICD-10-CM | POA: Diagnosis not present

## 2022-06-06 DIAGNOSIS — R011 Cardiac murmur, unspecified: Secondary | ICD-10-CM | POA: Diagnosis present

## 2022-06-06 HISTORY — DX: Chronic kidney disease, stage 3b: N18.32

## 2022-06-06 HISTORY — DX: Ischemic cardiomyopathy: I25.5

## 2022-06-06 HISTORY — DX: Chronic systolic (congestive) heart failure: I50.22

## 2022-06-06 HISTORY — PX: LEFT HEART CATH AND CORONARY ANGIOGRAPHY: CATH118249

## 2022-06-06 HISTORY — PX: CORONARY/GRAFT ACUTE MI REVASCULARIZATION: CATH118305

## 2022-06-06 SURGERY — CORONARY/GRAFT ACUTE MI REVASCULARIZATION
Anesthesia: LOCAL

## 2022-06-06 MED ORDER — VERAPAMIL HCL 2.5 MG/ML IV SOLN
INTRAVENOUS | Status: AC
  Filled 2022-06-06: qty 2

## 2022-06-06 MED ORDER — HEPARIN (PORCINE) IN NACL 1000-0.9 UT/500ML-% IV SOLN
INTRAVENOUS | Status: AC
  Filled 2022-06-06: qty 1000

## 2022-06-06 MED ORDER — LIDOCAINE HCL (PF) 1 % IJ SOLN
INTRAMUSCULAR | Status: AC
  Filled 2022-06-06: qty 30

## 2022-06-06 SURGICAL SUPPLY — 13 items
CATH INFINITI 5 FR AR1 MOD (CATHETERS) IMPLANT
CATH INFINITI 6F ANG MULTIPACK (CATHETERS) IMPLANT
DEVICE RAD COMP TR BAND LRG (VASCULAR PRODUCTS) IMPLANT
ELECT DEFIB PAD ADLT CADENCE (PAD) IMPLANT
GLIDESHEATH SLEND SS 6F .021 (SHEATH) IMPLANT
GUIDEWIRE INQWIRE 1.5J.035X260 (WIRE) IMPLANT
INQWIRE 1.5J .035X260CM (WIRE) ×1
KIT ENCORE 26 ADVANTAGE (KITS) IMPLANT
KIT HEART LEFT (KITS) ×1 IMPLANT
PACK CARDIAC CATHETERIZATION (CUSTOM PROCEDURE TRAY) ×1 IMPLANT
TRANSDUCER W/STOPCOCK (MISCELLANEOUS) ×1 IMPLANT
TUBING CIL FLEX 10 FLL-RA (TUBING) ×1 IMPLANT
WIRE HI TORQ VERSACORE-J 145CM (WIRE) IMPLANT

## 2022-06-06 NOTE — H&P (Addendum)
Cardiology Admission History and Physical   Patient ID: Larry May MRN: [id number]; DOB: 1946/06/24   Admission date: 06/06/2022  PCP:  Street, Sharon Mt, [place] Providers Cardiologist:  Shirlee More, MD        Chief Complaint:  Shoulder and Arm Pain  Patient Profile:   Larry May is a 76 y.o. male with pmh sx for HTN, HLD, T2DM, CAD s/p 3V CABG in 2021 ( left thoracic artery device to the left anterior descending and a vein graft to the PDA and OM1 and first diagonal branch) who is being seen 06/06/2022 for the evaluation of possible STEMI.  History of Present Illness:   Larry May is a 76 y.o. male with pmh sx for HTN, HLD, T2DM, CAD s/p 3V CABG in 2021 ( left thoracic artery device to the left anterior descending and a vein graft to the PDA and OM1 and first diagonal branch) who is being seen 06/06/2022 for the evaluation of possible STEMI. He had been doing well since surgery. But today started having on and off shoulder and arm pain. NO chest pain as such. EMS was called; and ST elevations in III and AVF were noted with reciprocal depressions in lateral leads. CODE STEMI was called. When he came to the ED, he was HDS. Normal BP. Denied chest pain. He was [facility] the [facility].   Past Medical History:  Diagnosis Date   Coronary artery disease 06/19/2020   Diabetes ([place])    Hyperlipidemia    Hypertension    Non-ST elevation (NSTEMI) myocardial infarction [facility])    NSTEMI (non-ST elevated myocardial infarction) ([place]) 06/14/2020   S/P CABG x 4 06/19/2020   LIMA to LAD SVG to DIAGONAL 1 SVG to OM2 SVG to PDA    Past Surgical History:  Procedure Laterality Date   CORONARY ARTERY BYPASS GRAFT N/A 06/19/2020   Procedure: CORONARY ARTERY BYPASS GRAFTING (CABG) times four , using left internal mammary artery and right leg greater saphenous vein harvested endoscopically;  Surgeon: Lajuana Matte, MD;  Location: [place];  Service: Open Heart  Surgery;  Laterality: N/A;   HERNIA REPAIR     x 3   LEFT HEART CATH AND CORONARY ANGIOGRAPHY N/A 06/14/2020   Procedure: LEFT HEART CATH AND CORONARY ANGIOGRAPHY;  Surgeon: Burnell Blanks, MD;  Location: [place] CV LAB;  Service: Cardiovascular;  Laterality: N/A;   PROSTATE SURGERY     TEE WITHOUT CARDIOVERSION N/A 06/19/2020   Procedure: TRANSESOPHAGEAL ECHOCARDIOGRAM (TEE);  Surgeon: Lajuana Matte, MD;  Location: [place];  Service: Open Heart Surgery;  Laterality: N/A;     Medications Prior to Admission: Prior to Admission medications   Medication Sig Start Date End Date Taking? Authorizing Provider  aspirin 81 MG EC tablet Take 81 mg by mouth daily. Swallow whole.    [provider]  atorvastatin (LIPITOR) 80 MG tablet Take 1 tablet (80 mg total) by mouth daily. 06/25/20   Gold, Wayne E, PA-C  empagliflozin (JARDIANCE) 10 MG TABS tablet Take 1 tablet (10 mg total) by mouth daily. 06/25/20   Gold, Wilder Glade, PA-C  furosemide (LASIX) 40 MG tablet Take 1 tablet (40 mg total) by mouth daily. 07/14/20   Lajuana Matte, MD  metoprolol tartrate (LOPRESSOR) 25 MG tablet Take 1 tablet (25 mg total) by mouth 2 (two) times daily. 08/31/20 11/29/20  Richardo Priest, MD     Allergies:   No Known Allergies  Social History:   Social History   Socioeconomic History   Marital status: Divorced    Spouse name: Not on file   Number of children: Not on file   Years of education: Not on file   Highest education level: Not on file  Occupational History   Not on file  Tobacco Use   Smoking status: Light Smoker    Types: Pipe   Smokeless tobacco: Never  Substance and Sexual Activity   Alcohol use: Not on file   Drug use: Not on file   Sexual activity: Not on file  Other Topics Concern   Not on file  Social History Narrative   Not on file   Social Determinants of Health   Financial Resource Strain: Not on file  Food Insecurity: Not on file  Transportation Needs:  Not on file  Physical Activity: Not on file  Stress: Not on file  Social Connections: Not on file  Intimate Partner Violence: Not on file    Family History:  The patient's family history includes Heart attack in his father.    ROS:  Please see the history of present illness.  All other ROS reviewed and negative.     Physical Exam/Data:  There were no vitals filed for this visit. No intake or output data in the 24 hours ending 06/06/22 2358    08/31/2020    1:48 PM 08/18/2020    9:24 AM 07/20/2020    9:43 AM  Last 3 Weights  Weight (lbs) 275 lb 3.2 oz 267 lb 267 lb  Weight (kg) 124.83 kg 121.11 kg 121.11 kg     There is no height or weight on file to calculate BMI.  General:  Well nourished, well developed, in no acute distress HEENT: normal Neck: no JVD Vascular: No carotid bruits; Distal pulses 2+ bilaterally   Cardiac:  normal S1, S2; RRR; no murmur  Lungs:  clear to auscultation bilaterally, no wheezing, rhonchi or rales  Abd: soft, nontender, no hepatomegaly  Ext: no edema Musculoskeletal:  No deformities, BUE and BLE strength normal and equal Skin: warm and dry  Neuro:  CNs 2-12 intact, no focal abnormalities noted Psych:  Normal affect    EKG:  The ECG that was done was personally reviewed and demonstrates ST elevation in III and AVF with reciprocal ST depressions in lateral lead.   Relevant CV Studies:  CABG X 4.  LIMA LAD, RSVG PDA, OM2, D1   Endoscopic greater saphenous vein harvest on the right Intra-operative Transesophageal Echocardiogram by Dr. Cliffton Asters on 06/19/2020.  Laboratory Data:  High Sensitivity Troponin:  No results for input(s): "TROPONINIHS" in the last 720 hours.    ChemistryNo results for input(s): "NA", "K", "CL", "CO2", "GLUCOSE", "BUN", "CREATININE", "CALCIUM", "MG", "GFRNONAA", "GFRAA", "ANIONGAP" in the last 168 hours.  No results for input(s): "PROT", "ALBUMIN", "AST", "ALT", "ALKPHOS", "BILITOT" in the last 168 hours. Lipids No  results for input(s): "CHOL", "TRIG", "HDL", "LABVLDL", "LDLCALC", "CHOLHDL" in the last 168 hours. HematologyNo results for input(s): "WBC", "RBC", "HGB", "HCT", "MCV", "MCH", "MCHC", "RDW", "PLT" in the last 168 hours. Thyroid No results for input(s): "TSH", "FREET4" in the last 168 hours. BNPNo results for input(s): "BNP", "PROBNP" in the last 168 hours.  DDimer No results for input(s): "DDIMER" in the last 168 hours.   Radiology/Studies:  No results found.   Assessment and Plan:   # Acute Inferior STEMI # HTN # T2DM # HLD  -He underwent a CABG x 4 on 06/19/2020 by  Dr. Irving Copas.  -Since then doing well, but today started having shoulder and arm pain.  -EKG showed ST elevation in III and AVF with reciprocal ST depressions in lateral lead.  -CODE STEMI activated; will take directly to the cath lab -Load with aspirin 325 mg -Load with Heparin -High dose statins -Beta Blocker -Echo in AM -Post LHC, CCU with telemetry -Insulin per protocol for T2DM  For questions or updates, please contact [facility] Please consult [email or web address] for contact info under     Signed, Hermelinda Dellen, MD  06/06/2022 11:58 PM    I have personally seen and examined this patient. I agree with the assessment and plan as outlined above.  Pt presenting with inferior ST elevation MI. Pain resolved on arrival to [facility]. Plans for emergent cardiac cath. Further plans to follow.   Verne Carrow, MD, [facility] 06/07/2022 2:09 AM

## 2022-06-06 NOTE — H&P (Incomplete)
Cardiology Admission History and Physical   Patient ID: Larry May MRN: [id number]; DOB: 1946-08-08   Admission date: 06/06/2022  PCP:  Street, Sharon Mt, MD   [place] Providers Cardiologist:  Shirlee More, MD   { Click here to update MD or APP on Care Team, Refresh:1}     Chief Complaint:  Shoulder and Arm Pain  Patient Profile:   Larry May is a 76 y.o. male with pmh sx for HTN, HLD, T2DM, CAD s/p 3V CABG in 2021 ( left thoracic artery device to the left anterior descending and a vein graft to the PDA and OM1 and first diagonal branch) who is being seen 06/06/2022 for the evaluation of possible STEMI.  History of Present Illness:   Larry May is a 76 y.o. male with pmh sx for HTN, HLD, T2DM, CAD s/p 3V CABG in 2021 ( left thoracic artery device to the left anterior descending and a vein graft to the PDA and OM1 and first diagonal branch) who is being seen 06/06/2022 for the evaluation of possible STEMI. He had been doing well since surgery. But today started having on and off shoulder and arm pain. NO chest pain as such. EMS was called; and ST elevations in III and AVF were noted with reciprocal depressions in lateral leads. CODE STEMI was called. When he came to the ED, he was HDS. Normal BP. Denied chest pain. He was taken directly to the [facility].   Past Medical History:  Diagnosis Date  . Coronary artery disease 06/19/2020  . Diabetes ([place])   . Hyperlipidemia   . Hypertension   . Non-ST elevation (NSTEMI) myocardial infarction ([place])   . NSTEMI (non-ST elevated myocardial infarction) ([place]) 06/14/2020  . S/P CABG x 4 06/19/2020   LIMA to LAD SVG to DIAGONAL 1 SVG to OM2 SVG to PDA    Past Surgical History:  Procedure Laterality Date  . CORONARY ARTERY BYPASS GRAFT N/A 06/19/2020   Procedure: CORONARY ARTERY BYPASS GRAFTING (CABG) times four , using left internal mammary artery and right leg greater saphenous vein harvested endoscopically;  Surgeon:  Lajuana Matte, MD;  Location: [place];  Service: Open Heart Surgery;  Laterality: N/A;  . HERNIA REPAIR     x 3  . LEFT HEART CATH AND CORONARY ANGIOGRAPHY N/A 06/14/2020   Procedure: LEFT HEART CATH AND CORONARY ANGIOGRAPHY;  Surgeon: Burnell Blanks, MD;  Location: [place] CV LAB;  Service: Cardiovascular;  Laterality: N/A;  . PROSTATE SURGERY    . TEE WITHOUT CARDIOVERSION N/A 06/19/2020   Procedure: TRANSESOPHAGEAL ECHOCARDIOGRAM (TEE);  Surgeon: Lajuana Matte, MD;  Location: [place];  Service: Open Heart Surgery;  Laterality: N/A;     Medications Prior to Admission: Prior to Admission medications   Medication Sig Start Date End Date Taking? Authorizing Provider  aspirin 81 MG EC tablet Take 81 mg by mouth daily. Swallow whole.    [provider]  atorvastatin (LIPITOR) 80 MG tablet Take 1 tablet (80 mg total) by mouth daily. 06/25/20   Gold, Wayne E, PA-C  empagliflozin (JARDIANCE) 10 MG TABS tablet Take 1 tablet (10 mg total) by mouth daily. 06/25/20   Gold, Wilder Glade, PA-C  furosemide (LASIX) 40 MG tablet Take 1 tablet (40 mg total) by mouth daily. 07/14/20   Lajuana Matte, MD  metoprolol tartrate (LOPRESSOR) 25 MG tablet Take 1 tablet (25 mg total) by mouth 2 (two) times daily. 08/31/20 11/29/20  Richardo Priest, MD  Allergies:   No Known Allergies  Social History:   Social History   Socioeconomic History  . Marital status: Divorced    Spouse name: Not on file  . Number of children: Not on file  . Years of education: Not on file  . Highest education level: Not on file  Occupational History  . Not on file  Tobacco Use  . Smoking status: Light Smoker    Types: Pipe  . Smokeless tobacco: Never  Substance and Sexual Activity  . Alcohol use: Not on file  . Drug use: Not on file  . Sexual activity: Not on file  Other Topics Concern  . Not on file  Social History Narrative  . Not on file   Social Determinants of Health   Financial  Resource Strain: Not on file  Food Insecurity: Not on file  Transportation Needs: Not on file  Physical Activity: Not on file  Stress: Not on file  Social Connections: Not on file  Intimate Partner Violence: Not on file    Family History:  The patient's family history includes Heart attack in his father.    ROS:  Please see the history of present illness.  All other ROS reviewed and negative.     Physical Exam/Data:  There were no vitals filed for this visit. No intake or output data in the 24 hours ending 06/06/22 2358    08/31/2020    1:48 PM 08/18/2020    9:24 AM 07/20/2020    9:43 AM  Last 3 Weights  Weight (lbs) 275 lb 3.2 oz 267 lb 267 lb  Weight (kg) 124.83 kg 121.11 kg 121.11 kg     There is no height or weight on file to calculate BMI.  General:  Well nourished, well developed, in no acute distress HEENT: normal Neck: no JVD Vascular: No carotid bruits; Distal pulses 2+ bilaterally   Cardiac:  normal S1, S2; RRR; no murmur  Lungs:  clear to auscultation bilaterally, no wheezing, rhonchi or rales  Abd: soft, nontender, no hepatomegaly  Ext: no edema Musculoskeletal:  No deformities, BUE and BLE strength normal and equal Skin: warm and dry  Neuro:  CNs 2-12 intact, no focal abnormalities noted Psych:  Normal affect    EKG:  The ECG that was done  was personally reviewed and demonstrates ST elevation in III and AVF with reciprocal ST depressions in lateral lead.   Relevant CV Studies:    Laboratory Data:  High Sensitivity Troponin:  No results for input(s): "TROPONINIHS" in the last 720 hours.    ChemistryNo results for input(s): "NA", "K", "CL", "CO2", "GLUCOSE", "BUN", "CREATININE", "CALCIUM", "MG", "GFRNONAA", "GFRAA", "ANIONGAP" in the last 168 hours.  No results for input(s): "PROT", "ALBUMIN", "AST", "ALT", "ALKPHOS", "BILITOT" in the last 168 hours. Lipids No results for input(s): "CHOL", "TRIG", "HDL", "LABVLDL", "LDLCALC", "CHOLHDL" in the last 168  hours. HematologyNo results for input(s): "WBC", "RBC", "HGB", "HCT", "MCV", "MCH", "MCHC", "RDW", "PLT" in the last 168 hours. Thyroid No results for input(s): "TSH", "FREET4" in the last 168 hours. BNPNo results for input(s): "BNP", "PROBNP" in the last 168 hours.  DDimer No results for input(s): "DDIMER" in the last 168 hours.   Radiology/Studies:  No results found.   Assessment and Plan:     For questions or updates, please contact [place] Please consult [email or web address] for contact info under     Signed, Jaci Lazier, MD  06/06/2022 11:58 PM

## 2022-06-07 ENCOUNTER — Encounter (HOSPITAL_COMMUNITY): Payer: Self-pay | Admitting: Cardiovascular Disease

## 2022-06-07 ENCOUNTER — Inpatient Hospital Stay (HOSPITAL_COMMUNITY): Payer: Medicare HMO

## 2022-06-07 DIAGNOSIS — I25810 Atherosclerosis of coronary artery bypass graft(s) without angina pectoris: Secondary | ICD-10-CM | POA: Diagnosis not present

## 2022-06-07 DIAGNOSIS — E1122 Type 2 diabetes mellitus with diabetic chronic kidney disease: Secondary | ICD-10-CM | POA: Diagnosis not present

## 2022-06-07 DIAGNOSIS — I130 Hypertensive heart and chronic kidney disease with heart failure and stage 1 through stage 4 chronic kidney disease, or unspecified chronic kidney disease: Secondary | ICD-10-CM | POA: Diagnosis not present

## 2022-06-07 DIAGNOSIS — I2510 Atherosclerotic heart disease of native coronary artery without angina pectoris: Secondary | ICD-10-CM

## 2022-06-07 DIAGNOSIS — F17290 Nicotine dependence, other tobacco product, uncomplicated: Secondary | ICD-10-CM | POA: Diagnosis not present

## 2022-06-07 DIAGNOSIS — I252 Old myocardial infarction: Secondary | ICD-10-CM | POA: Diagnosis not present

## 2022-06-07 DIAGNOSIS — I255 Ischemic cardiomyopathy: Secondary | ICD-10-CM | POA: Diagnosis not present

## 2022-06-07 DIAGNOSIS — I878 Other specified disorders of veins: Secondary | ICD-10-CM | POA: Diagnosis not present

## 2022-06-07 DIAGNOSIS — E785 Hyperlipidemia, unspecified: Secondary | ICD-10-CM | POA: Diagnosis not present

## 2022-06-07 DIAGNOSIS — Y832 Surgical operation with anastomosis, bypass or graft as the cause of abnormal reaction of the patient, or of later complication, without mention of misadventure at the time of the procedure: Secondary | ICD-10-CM | POA: Diagnosis not present

## 2022-06-07 DIAGNOSIS — Z79899 Other long term (current) drug therapy: Secondary | ICD-10-CM | POA: Diagnosis not present

## 2022-06-07 DIAGNOSIS — N1832 Chronic kidney disease, stage 3b: Secondary | ICD-10-CM | POA: Diagnosis not present

## 2022-06-07 DIAGNOSIS — I2119 ST elevation (STEMI) myocardial infarction involving other coronary artery of inferior wall: Secondary | ICD-10-CM | POA: Diagnosis not present

## 2022-06-07 DIAGNOSIS — I5021 Acute systolic (congestive) heart failure: Secondary | ICD-10-CM | POA: Diagnosis not present

## 2022-06-07 DIAGNOSIS — T82855A Stenosis of coronary artery stent, initial encounter: Secondary | ICD-10-CM | POA: Diagnosis not present

## 2022-06-07 DIAGNOSIS — R011 Cardiac murmur, unspecified: Secondary | ICD-10-CM | POA: Diagnosis not present

## 2022-06-07 DIAGNOSIS — Z8249 Family history of ischemic heart disease and other diseases of the circulatory system: Secondary | ICD-10-CM | POA: Diagnosis not present

## 2022-06-07 DIAGNOSIS — Z7984 Long term (current) use of oral hypoglycemic drugs: Secondary | ICD-10-CM | POA: Diagnosis not present

## 2022-06-07 DIAGNOSIS — M79602 Pain in left arm: Secondary | ICD-10-CM | POA: Diagnosis not present

## 2022-06-07 DIAGNOSIS — R001 Bradycardia, unspecified: Secondary | ICD-10-CM | POA: Diagnosis not present

## 2022-06-07 DIAGNOSIS — Z7982 Long term (current) use of aspirin: Secondary | ICD-10-CM | POA: Diagnosis not present

## 2022-06-07 LAB — CBC
HCT: 47.2 % (ref 39.0–52.0)
Hemoglobin: 16.2 g/dL (ref 13.0–17.0)
MCH: 28.5 pg (ref 26.0–34.0)
MCHC: 34.3 g/dL (ref 30.0–36.0)
MCV: 83 fL (ref 80.0–100.0)
Platelets: 139 10*3/uL — ABNORMAL LOW (ref 150–400)
RBC: 5.69 MIL/uL (ref 4.22–5.81)
RDW: 14.1 % (ref 11.5–15.5)
WBC: 8.8 10*3/uL (ref 4.0–10.5)
nRBC: 0 % (ref 0.0–0.2)

## 2022-06-07 LAB — ECHOCARDIOGRAM COMPLETE
AR max vel: 2.63 cm2
AV Area VTI: 3.5 cm2
AV Area mean vel: 2.39 cm2
AV Mean grad: 3 mmHg
AV Peak grad: 6.2 mmHg
Ao pk vel: 1.24 m/s
Area-P 1/2: 3.17 cm2
Calc EF: 37.8 %
MV M vel: 1.71 m/s
MV Peak grad: 11.7 mmHg
S' Lateral: 4.4 cm
Single Plane A2C EF: 35.5 %
Single Plane A4C EF: 42.8 %
Weight: 4564.4 oz

## 2022-06-07 LAB — POCT I-STAT, CHEM 8
BUN: 21 mg/dL (ref 8–23)
Calcium, Ion: 1.18 mmol/L (ref 1.15–1.40)
Chloride: 101 mmol/L (ref 98–111)
Creatinine, Ser: 1.1 mg/dL (ref 0.61–1.24)
Glucose, Bld: 188 mg/dL — ABNORMAL HIGH (ref 70–99)
HCT: 45 % (ref 39.0–52.0)
Hemoglobin: 15.3 g/dL (ref 13.0–17.0)
Potassium: 3.9 mmol/L (ref 3.5–5.1)
Sodium: 136 mmol/L (ref 135–145)
TCO2: 23 mmol/L (ref 22–32)

## 2022-06-07 LAB — BASIC METABOLIC PANEL
Anion gap: 13 (ref 5–15)
BUN: 19 mg/dL (ref 8–23)
CO2: 25 mmol/L (ref 22–32)
Calcium: 9.6 mg/dL (ref 8.9–10.3)
Chloride: 102 mmol/L (ref 98–111)
Creatinine, Ser: 1.29 mg/dL — ABNORMAL HIGH (ref 0.61–1.24)
GFR, Estimated: 57 mL/min — ABNORMAL LOW (ref >60)
Glucose, Bld: 184 mg/dL — ABNORMAL HIGH (ref 70–99)
Potassium: 4.8 mmol/L (ref 3.5–5.1)
Sodium: 140 mmol/L (ref 135–145)

## 2022-06-07 LAB — GLUCOSE, CAPILLARY
Glucose-Capillary: 195 mg/dL — ABNORMAL HIGH (ref 70–99)
Glucose-Capillary: 182 mg/dL — ABNORMAL HIGH (ref 70–99)
Glucose-Capillary: 162 mg/dL — ABNORMAL HIGH (ref 70–99)
Glucose-Capillary: 209 mg/dL — ABNORMAL HIGH (ref 70–99)
Glucose-Capillary: 296 mg/dL — ABNORMAL HIGH (ref 70–99)

## 2022-06-07 LAB — TROPONIN I (HIGH SENSITIVITY)
Troponin I (High Sensitivity): 3464 ng/L (ref <18)
Troponin I (High Sensitivity): >24000 ng/L (ref <18)

## 2022-06-07 LAB — HEMOGLOBIN A1C
Hgb A1c MFr Bld: 7 % — ABNORMAL HIGH (ref 4.8–5.6)
Mean Plasma Glucose: 154.2 mg/dL

## 2022-06-07 MED ORDER — ONDANSETRON HCL 4 MG/2ML IJ SOLN: 4.0000 mg | Freq: Four times a day (QID) | INTRAMUSCULAR | Status: DC | PRN

## 2022-06-07 MED ORDER — ORAL CARE MOUTH RINSE: 15.0000 mL | OROMUCOSAL | Status: DC | PRN

## 2022-06-07 MED ORDER — INSULIN ASPART 100 UNIT/ML IJ SOLN
0.0000 [IU] | Freq: Three times a day (TID) | INTRAMUSCULAR | Status: DC
  Administered 2022-06-07: 3 [IU] via SUBCUTANEOUS
  Administered 2022-06-07: 5 [IU] via SUBCUTANEOUS
  Administered 2022-06-07 – 2022-06-08 (×3): 3 [IU] via SUBCUTANEOUS
  Administered 2022-06-08: 2 [IU] via SUBCUTANEOUS
  Administered 2022-06-09: 3 [IU] via SUBCUTANEOUS

## 2022-06-07 MED ORDER — ASPIRIN 81 MG PO TBEC
81.0000 mg | DELAYED_RELEASE_TABLET | Freq: Every day | ORAL | Status: DC
  Administered 2022-06-07 – 2022-06-09 (×3): 81 mg via ORAL
  Filled 2022-06-07 (×3): qty 1

## 2022-06-07 MED ORDER — PERFLUTREN LIPID MICROSPHERE
1.0000 mL | INTRAVENOUS | Status: AC | PRN
  Administered 2022-06-07: 2 mL via INTRAVENOUS

## 2022-06-07 MED ORDER — HEPARIN SODIUM (PORCINE) 1000 UNIT/ML IJ SOLN
INTRAMUSCULAR | Status: AC
  Filled 2022-06-07: qty 20

## 2022-06-07 MED ORDER — CHLORHEXIDINE GLUCONATE CLOTH 2 % EX PADS
6.0000 | MEDICATED_PAD | Freq: Every day | CUTANEOUS | Status: DC
  Administered 2022-06-08 – 2022-06-09 (×2): 6 via TOPICAL

## 2022-06-07 MED ORDER — CLOPIDOGREL BISULFATE 75 MG PO TABS
75.0000 mg | ORAL_TABLET | Freq: Every day | ORAL | Status: DC
  Administered 2022-06-08: 75 mg via ORAL
  Filled 2022-06-07 (×2): qty 1

## 2022-06-07 MED ORDER — SODIUM CHLORIDE 0.9% FLUSH
3.0000 mL | Freq: Two times a day (BID) | INTRAVENOUS | Status: DC
  Administered 2022-06-07 – 2022-06-09 (×4): 3 mL via INTRAVENOUS

## 2022-06-07 MED ORDER — CLOPIDOGREL BISULFATE 300 MG PO TABS
600.0000 mg | ORAL_TABLET | Freq: Once | ORAL | Status: AC
  Administered 2022-06-07: 600 mg via ORAL
  Filled 2022-06-07: qty 2

## 2022-06-07 MED ORDER — SODIUM CHLORIDE 0.9% FLUSH: 3.0000 mL | INTRAVENOUS | Status: DC | PRN

## 2022-06-07 MED ORDER — AMLODIPINE BESYLATE 5 MG PO TABS
5.0000 mg | ORAL_TABLET | Freq: Every day | ORAL | Status: DC
  Administered 2022-06-07 – 2022-06-08 (×2): 5 mg via ORAL
  Filled 2022-06-07 (×3): qty 1

## 2022-06-07 MED ORDER — EMPAGLIFLOZIN 10 MG PO TABS
10.0000 mg | ORAL_TABLET | Freq: Every day | ORAL | Status: DC
  Filled 2022-06-07: qty 1

## 2022-06-07 MED ORDER — SODIUM CHLORIDE 0.9 % IV SOLN: 250.0000 mL | INTRAVENOUS | Status: DC | PRN

## 2022-06-07 MED ORDER — HEPARIN (PORCINE) IN NACL 1000-0.9 UT/500ML-% IV SOLN
INTRAVENOUS | Status: DC | PRN
  Administered 2022-06-07 (×2): 500 mL

## 2022-06-07 MED ORDER — VERAPAMIL HCL 2.5 MG/ML IV SOLN
INTRAVENOUS | Status: DC | PRN
  Administered 2022-06-07: 10 mL via INTRA_ARTERIAL

## 2022-06-07 MED ORDER — HYDRALAZINE HCL 20 MG/ML IJ SOLN: 10.0000 mg | INTRAMUSCULAR | Status: AC | PRN

## 2022-06-07 MED ORDER — DAPAGLIFLOZIN PROPANEDIOL 10 MG PO TABS
10.0000 mg | ORAL_TABLET | Freq: Every day | ORAL | Status: DC
  Administered 2022-06-07 – 2022-06-09 (×3): 10 mg via ORAL
  Filled 2022-06-07 (×3): qty 1

## 2022-06-07 MED ORDER — IOHEXOL 350 MG/ML SOLN
INTRAVENOUS | Status: DC | PRN
  Administered 2022-06-07: 75 mL via INTRA_ARTERIAL

## 2022-06-07 MED ORDER — METOPROLOL TARTRATE 25 MG PO TABS
25.0000 mg | ORAL_TABLET | Freq: Two times a day (BID) | ORAL | Status: DC
  Administered 2022-06-07 – 2022-06-09 (×6): 25 mg via ORAL
  Filled 2022-06-07 (×7): qty 1

## 2022-06-07 MED ORDER — ATORVASTATIN CALCIUM 80 MG PO TABS
80.0000 mg | ORAL_TABLET | Freq: Every day | ORAL | Status: DC
  Administered 2022-06-07 – 2022-06-09 (×3): 80 mg via ORAL
  Filled 2022-06-07 (×3): qty 1

## 2022-06-07 MED ORDER — LABETALOL HCL 5 MG/ML IV SOLN: 10.0000 mg | INTRAVENOUS | Status: AC | PRN

## 2022-06-07 MED ORDER — LIDOCAINE HCL (PF) 1 % IJ SOLN
INTRAMUSCULAR | Status: DC | PRN
  Administered 2022-06-07: 2 mL

## 2022-06-07 MED ORDER — SODIUM CHLORIDE 0.9 % IV SOLN: INTRAVENOUS | Status: AC

## 2022-06-07 MED ORDER — ACETAMINOPHEN 325 MG PO TABS: 650.0000 mg | ORAL_TABLET | ORAL | Status: DC | PRN

## 2022-06-07 MED ORDER — NITROGLYCERIN 1 MG/10 ML FOR IR/CATH LAB
INTRA_ARTERIAL | Status: AC
  Filled 2022-06-07: qty 10

## 2022-06-07 MED ORDER — HEPARIN SODIUM (PORCINE) 1000 UNIT/ML IJ SOLN
INTRAMUSCULAR | Status: DC | PRN
  Administered 2022-06-07: 12000 [IU] via INTRAVENOUS

## 2022-06-07 NOTE — Progress Notes (Signed)
Patient verbalizing that he wants to go home as soon as possible. Patient is asking how we can get him back to his car so he can drive himself home from [employer] where his car was left. Patient attempted to take 3L [place] off because he doesn't need oxygen. Asked patient to leave it on if he was willing until we assessed him and got him out of bed to walk and sit up in the chair. Patient agreeable. Patient's O2 on 3L was only 92 percent in the bed. Patient ambulated in hall and positioned in chair and oxygen removed per patient request. Patient oxygenating right at 90 percent on RA. Patient states that he "would not wear oxygen at home even if he needed it". Patient said he tried to use a CPAP machine and it made his mouth "feel like the [facility]" and that he was not willing to use it. Went to get patient back to bed for ECHO and patient asked why he needed this test. Educated patient on what an ECHO is and how it can help his care team direct his care and he asked why the doctors haven't been in yet and why he needs all of these tests. He said, " I am 76 years old, I have lived a good life. They weren't able to do anything for my heart yesterday so I don't understand why I am still here." Cardiology contacted and updated on patient condition and requests. Will round on patient at their earliest convenience.

## 2022-06-07 NOTE — Progress Notes (Signed)
  Echocardiogram 2D Echocardiogram has been performed.  Larry May 06/07/2022, 12:54 PM

## 2022-06-07 NOTE — Progress Notes (Signed)
Echo attempted. Patient in chair. Contacted nurse to request patient be returned to bed for echo. Will attempt again later as schedule permits.

## 2022-06-07 NOTE — Progress Notes (Signed)
CARDIAC REHAB PHASE I     Post MI education including site care, restrictions, heart healthy diabetic diet, risk factors, exercise guidelines,MI booklet and CRP2 reviewed. All questions and concerns addressed. Refer to [facility] for CRP2. Will continue to follow.     [id number]  Vanessa Barbara, RN BSN 06/07/2022 1:50 PM

## 2022-06-07 NOTE — Progress Notes (Signed)
Per RN, patient is refusing echo at this time. RN messaged cardiology.

## 2022-06-07 NOTE — Progress Notes (Addendum)
Rounding Note    Patient Name: Larry May Date of Encounter: 06/07/2022  [facility] Cardiologist: Norman Herrlich, MD   Subjective   No chest pain, back pain, arm, shoulder pain today. No dyspnea.   Inpatient Medications    Scheduled Meds:  aspirin EC  81 mg Oral Daily   atorvastatin  80 mg Oral Daily   [START ON 06/08/2022] clopidogrel  75 mg Oral Daily   dapagliflozin propanediol  10 mg Oral Daily   insulin aspart  0-15 Units Subcutaneous TID WC   metoprolol tartrate  25 mg Oral BID   sodium chloride flush  3 mL Intravenous Q12H   Continuous Infusions:  sodium chloride     PRN Meds: sodium chloride, acetaminophen, ondansetron (ZOFRAN) IV, sodium chloride flush   Vital Signs    Vitals:   06/07/22 0900 06/07/22 0915 06/07/22 0930 06/07/22 0945  BP: 123/77 107/73 121/73 136/84  Pulse: 66 67 65 65  Resp: 19 18 (!) 21 16  Temp:      TempSrc:      SpO2: 91% 90% 90% 92%  Weight:        Intake/Output Summary (Last 24 hours) at 06/07/2022 1044 Last data filed at 06/07/2022 0800 Gross per 24 hour  Intake 526.19 ml  Output 2850 ml  Net -2323.81 ml      06/07/2022    1:00 AM 08/31/2020    1:48 PM 08/18/2020    9:24 AM  Last 3 Weights  Weight (lbs) 285 lb 4.4 oz 275 lb 3.2 oz 267 lb  Weight (kg) 129.4 kg 124.83 kg 121.11 kg      Telemetry    Sinus rhythm - Personally Reviewed  ECG    No new ECG today  Physical Exam   GEN: No acute distress.   Neck: No JVD Cardiac: RRR, no murmurs, rubs, or gallops.  Respiratory: Clear to auscultation bilaterally. GI: Soft, nontender, non-distended  MS: No edema; No deformity. Neuro:  Nonfocal  Psych: Normal affect   Labs    High Sensitivity Troponin:   Recent Labs  Lab 06/07/22 0330  TROPONINIHS 3,464*     Chemistry Recent Labs  Lab 06/07/22 0024 06/07/22 0330  NA 136 140  K 3.9 4.8  CL 101 102  CO2  --  25  GLUCOSE 188* 184*  BUN 21 19  CREATININE 1.10 1.29*  CALCIUM  --  9.6   GFRNONAA  --  57*  ANIONGAP  --  13    Lipids No results for input(s): "CHOL", "TRIG", "HDL", "LABVLDL", "LDLCALC", "CHOLHDL" in the last 168 hours.  Hematology Recent Labs  Lab 06/07/22 0024 06/07/22 0330  WBC  --  8.8  RBC  --  5.69  HGB 15.3 16.2  HCT 45.0 47.2  MCV  --  83.0  MCH  --  28.5  MCHC  --  34.3  RDW  --  14.1  PLT  --  139*   Thyroid No results for input(s): "TSH", "FREET4" in the last 168 hours.  BNPNo results for input(s): "BNP", "PROBNP" in the last 168 hours.  DDimer No results for input(s): "DDIMER" in the last 168 hours.   Radiology    CARDIAC CATHETERIZATION  Result Date: 06/07/2022   Prox LAD lesion is 70% stenosed.   Dist LAD lesion is 40% stenosed.   Dist Cx lesion is 100% stenosed.   Ost Cx lesion is 80% stenosed.   Dist RCA lesion is 40% stenosed.   1st Diag-2  lesion is 60% stenosed.   1st Mrg lesion is 50% stenosed.   2nd Mrg lesion is 99% stenosed.   RPDA lesion is 99% stenosed.   Mid RCA to Dist RCA lesion is 60% stenosed.   1st Diag-1 lesion is 100% stenosed.   Mid Graft lesion is 100% stenosed.   SVG graft was visualized by angiography.   SVG graft was visualized by angiography. Three vessel CAD s/p 4V CABG with 2/4 patent grafts The mid LAD has a moderate stenosis. The LIMA graft to the mid LAD is atretic. Patent vein graft to the Diagonal branch The Circumflex has a severe ostial stenosis. Patent vein graft to OM. The large dominant RCA has a moderately severe mid stenosis. The PDA is occluded. Collaterals to the distal Circumflex through the PDA. The RCA is unchanged from prior cath. The vein graft to the PDA is now occluded. Recommendations: He is pain free. The LAD has good flow. Patent grafts to the OM and Diagonal. PCI of the mid RCA could be considered if he has recurrent angina. Second STEMI activation prior to this case so I did not plan PCI of this chronic lesion. Will admit to the ICU tonight. Will load with Plavix now and continue ASA/Plavix.  High intensity statin and beta blocker. Echo later today.    Cardiac Studies     Prox LAD lesion is 70% stenosed.   Dist LAD lesion is 40% stenosed.   Dist Cx lesion is 100% stenosed.   Ost Cx lesion is 80% stenosed.   Dist RCA lesion is 40% stenosed.   1st Diag-2 lesion is 60% stenosed.   1st Mrg lesion is 50% stenosed.   2nd Mrg lesion is 99% stenosed.   RPDA lesion is 99% stenosed.   Mid RCA to Dist RCA lesion is 60% stenosed.   1st Diag-1 lesion is 100% stenosed.   Mid Graft lesion is 100% stenosed.   SVG graft was visualized by angiography.   SVG graft was visualized by angiography.   Three vessel CAD s/p 4V CABG with 2/4 patent grafts The mid LAD has a moderate stenosis. The LIMA graft to the mid LAD is atretic. Patent vein graft to the Diagonal branch The Circumflex has a severe ostial stenosis. Patent vein graft to OM.  The large dominant RCA has a moderately severe mid stenosis. The PDA is occluded. Collaterals to the distal Circumflex through the PDA. The RCA is unchanged from prior cath. The vein graft to the PDA is now occluded.    Recommendations: He is pain free. The LAD has good flow. Patent grafts to the OM and Diagonal. PCI of the mid RCA could be considered if he has recurrent angina. Second STEMI activation prior to this case so I did not plan PCI of this chronic lesion. Will admit to the ICU tonight. Will load with Plavix now and continue ASA/Plavix. High intensity statin and beta blocker. Echo later today.     Patient Profile     76 y.o. male with history of HTN, HLD, DM2, 3v CABG in 2021 (L thoracic artery to LAD and venous grafts to PDA and OM1 and first diagonal branch) who presented after having intermittent shoulder and arm pain, without chest pain, found to have evidence of inferior STEMI on EKG  Assessment & Plan    Principal Problem:   Acute ST elevation myocardial infarction (STEMI) of inferior wall [facility]) Active Problems:   Coronary artery disease    Hypertension   Hyperlipidemia  Diabetes ([place])   ST elevation myocardial infarction (STEMI) of inferior wall (HCC)  Acute inferior STEMI HTN HLD Significant disease burden in native and graft vessels, unfavorable for PCI. Fortunately with some collateralization. Will optimize medical therapy over the course of this hospitalization. LDL 66. Blood pressures still somewhat elevated. Echo will be crucial to help guide therapy. -ASA/Plavix -Atorvastatin 80 mg, will repeat lipid panel tomorrow -Dapagliflozin 10 mg -Metoprolol tartrate 25 mg BID -Start amlodipine 5 mg -Pending echo  DM2 -SSI  For questions or updates, please contact [place] Please consult [email or web address] for contact info under        Signed, Nani Gasser, MD  06/07/2022, 10:44 AM     Patient seen and examined. Agree with assessment and plan.  Mr. Larry May is a 76 year old gentleman who is originally from [place] and previously worked as a Archivist.  He is status post prior CABG revascularization surgery by Dr. Kipp Brood in 2021 with a LIMA to LAD, SVG to OM 1, SVG to diagonal, and SVG to PDA.  He developed recurrent chest pain last evening and was transferred from [facility] ER and acute catheterization demonstrates severe native CAD with an occluded LIMA graft which had supplied the LAD, and an occluded SVG which had supplied the PDA.  The vein graft supplying the diagonal vessel is patent as is the vein graft supplying the circumflex marginal vessel.  PCI was not performed.  Patient's chest pain had resolved upon arrival to [facility].  I reviewed the patient's anatomy with him in detail.  He was unaware that they had taken any veins from his legs.  I strongly recommended the patient undergo an echo Doppler study which he initially had deferred for evaluation of LV dysfunction and to allow for optimization of medical therapy.  Presently, he remains pain-free in the CCU.  Pressure 136/84.  Pulse in  the 60s.  No JVD.  Lungs relatively clear.  Rhythm regular with 1/6 to 2/6 systolic murmur.  Abdomen nontender.  Left radial cath site stable.  Bilateral lower extremity venous stasis changes with trivial edema.  Neurologic grossly nonfocal.  He was treated with aspirin/Plavix ? if may benefit from more potent Brilinta.  He is now on metoprolol tartrate 25 mg twice a day and with stable blood pressure multivessel disease we will also add amlodipine to provide additional anti-ischemic benefit as well as blood pressure control.  If LV is significantly depressed should initiate ARB or possibly Entresto.  He is diabetic on dapagliflozin.  Aim for target LDL less than 55 and he is now on atorvastatin 80 mg and if not at target add Zetia 10 mg or possible future PCSK9 inhibition.   Troy Sine, MD, [facility] 06/07/2022 2:47 PM

## 2022-06-08 DIAGNOSIS — I2119 ST elevation (STEMI) myocardial infarction involving other coronary artery of inferior wall: Secondary | ICD-10-CM | POA: Diagnosis not present

## 2022-06-08 LAB — CBC
HCT: 47.4 % (ref 39.0–52.0)
Hemoglobin: 16.1 g/dL (ref 13.0–17.0)
MCH: 28.7 pg (ref 26.0–34.0)
MCHC: 34 g/dL (ref 30.0–36.0)
MCV: 84.5 fL (ref 80.0–100.0)
Platelets: 147 10*3/uL — ABNORMAL LOW (ref 150–400)
RBC: 5.61 MIL/uL (ref 4.22–5.81)
RDW: 14.5 % (ref 11.5–15.5)
WBC: 8.8 10*3/uL (ref 4.0–10.5)
nRBC: 0 % (ref 0.0–0.2)

## 2022-06-08 LAB — BASIC METABOLIC PANEL
Anion gap: 9 (ref 5–15)
BUN: 24 mg/dL — ABNORMAL HIGH (ref 8–23)
CO2: 26 mmol/L (ref 22–32)
Calcium: 9 mg/dL (ref 8.9–10.3)
Chloride: 106 mmol/L (ref 98–111)
Creatinine, Ser: 1.58 mg/dL — ABNORMAL HIGH (ref 0.61–1.24)
GFR, Estimated: 44 mL/min — ABNORMAL LOW (ref >60)
Glucose, Bld: 196 mg/dL — ABNORMAL HIGH (ref 70–99)
Potassium: 3.9 mmol/L (ref 3.5–5.1)
Sodium: 141 mmol/L (ref 135–145)

## 2022-06-08 LAB — LIPID PANEL
Cholesterol: 137 mg/dL (ref 0–200)
HDL: 37 mg/dL — ABNORMAL LOW (ref >40)
LDL Cholesterol: 76 mg/dL (ref 0–99)
Total CHOL/HDL Ratio: 3.7 RATIO
Triglycerides: 119 mg/dL (ref <150)
VLDL: 24 mg/dL (ref 0–40)

## 2022-06-08 LAB — GLUCOSE, CAPILLARY
Glucose-Capillary: 198 mg/dL — ABNORMAL HIGH (ref 70–99)
Glucose-Capillary: 162 mg/dL — ABNORMAL HIGH (ref 70–99)
Glucose-Capillary: 144 mg/dL — ABNORMAL HIGH (ref 70–99)
Glucose-Capillary: 188 mg/dL — ABNORMAL HIGH (ref 70–99)

## 2022-06-08 LAB — LIPOPROTEIN A (LPA): Lipoprotein (a): 51.9 nmol/L — ABNORMAL HIGH (ref <75.0)

## 2022-06-08 MED ORDER — EZETIMIBE 10 MG PO TABS
10.0000 mg | ORAL_TABLET | Freq: Every day | ORAL | Status: DC
  Administered 2022-06-08 – 2022-06-09 (×2): 10 mg via ORAL
  Filled 2022-06-08 (×2): qty 1

## 2022-06-08 NOTE — Progress Notes (Signed)
Rounding Note    Patient Name: Larry May Date of Encounter: 06/08/2022  [place] Cardiologist: Shirlee More, MD    Patient Profile     76 y.o. male with history of HTN, HLD, DM2, 3v CABG in 2021 (L thoracic artery to LAD and venous grafts to PDA and OM1 and first diagonal branch) who presented after having intermittent shoulder and arm pain, without chest pain, found to have evidence of inferior STEMI on EKG cATH>> I Significant disease burden in native and graft vessels, unfavorable for PCI  >>Medical therapy  Echo  EF40-45% Subjective   No recurrent shoulder back pain.  Walking without difficulty. Not inclined to return to Dr. Bettina Gavia  Inpatient Medications    Scheduled Meds:  amLODipine  5 mg Oral Daily   aspirin EC  81 mg Oral Daily   atorvastatin  80 mg Oral Daily   Chlorhexidine Gluconate Cloth  6 each Topical Daily   clopidogrel  75 mg Oral Daily   dapagliflozin propanediol  10 mg Oral Daily   insulin aspart  0-15 Units Subcutaneous TID WC   metoprolol tartrate  25 mg Oral BID   sodium chloride flush  3 mL Intravenous Q12H   Continuous Infusions:  sodium chloride     PRN Meds: sodium chloride, acetaminophen, ondansetron (ZOFRAN) IV, mouth rinse, sodium chloride flush   Vital Signs    Vitals:   06/08/22 0200 06/08/22 0400 06/08/22 0600 06/08/22 0700  BP: 133/82  134/73   Pulse: (!) 59 (!) 55 (!) 54   Resp: 15 19 17    Temp:  98.7 F (37.1 C)    TempSrc:  Oral    SpO2: 100% 100% 100%   Weight:    128.2 kg    Intake/Output Summary (Last 24 hours) at 06/08/2022 0818 Last data filed at 06/08/2022 0700 Gross per 24 hour  Intake --  Output 1200 ml  Net -1200 ml       06/08/2022    7:00 AM 06/07/2022    1:00 AM 08/31/2020    1:48 PM  Last 3 Weights  Weight (lbs) 282 lb 10.1 oz 285 lb 4.4 oz 275 lb 3.2 oz  Weight (kg) 128.2 kg 129.4 kg 124.83 kg      Telemetry    Sinus personally Reviewed  ECG    No new ECG  today  Physical Exam    BP 134/73   Pulse (!) 54   Temp 98.7 F (37.1 C) (Oral)   Resp 17   Wt 128.2 kg   SpO2 100%   BMI 36.29 kg/m  Well developed and Morbidly obese in no acute distress HENT normal Neck supple with JVP-flat Clear Regular rate and rhythm, no murmur Abd-soft with active BS No Clubbing cyanosis tr edema Skin-warm and dry chronic venous changes A & Oriented  Grossly normal sensory and motor function      Labs    High Sensitivity Troponin:   Recent Labs  Lab 06/07/22 0330 06/07/22 1308  TROPONINIHS 3,464* >24,000*      Chemistry Recent Labs  Lab 06/07/22 0024 06/07/22 0330  NA 136 140  K 3.9 4.8  CL 101 102  CO2  --  25  GLUCOSE 188* 184*  BUN 21 19  CREATININE 1.10 1.29*  CALCIUM  --  9.6  GFRNONAA  --  57*  ANIONGAP  --  13     Lipids No results for input(s): "CHOL", "TRIG", "HDL", "LABVLDL", "LDLCALC", "CHOLHDL" in the last 168  hours.  Hematology Recent Labs  Lab 06/07/22 0024 06/07/22 0330 06/08/22 0729  WBC  --  8.8 8.8  RBC  --  5.69 5.61  HGB 15.3 16.2 16.1  HCT 45.0 47.2 47.4  MCV  --  83.0 84.5  MCH  --  28.5 28.7  MCHC  --  34.3 34.0  RDW  --  14.1 14.5  PLT  --  139* 147*    Thyroid No results for input(s): "TSH", "FREET4" in the last 168 hours.  BNPNo results for input(s): "BNP", "PROBNP" in the last 168 hours.  DDimer No results for input(s): "DDIMER" in the last 168 hours.   Radiology    ECHOCARDIOGRAM COMPLETE  Result Date: 06/07/2022    ECHOCARDIOGRAM REPORT   Patient Name:   Larry May Date of Exam: 06/07/2022 Medical Rec #:  [id number]        Height:       74.0 in Accession #:    [id number]       Weight:       285.3 lb Date of Birth:  May 04, 1946        BSA:          2.528 m Patient Age:    43 years         BP:           136/84 mmHg Patient Gender: M                HR:           60 bpm. Exam Location:  Inpatient Procedure: 2D Echo and Intracardiac Opacification Agent Indications:    CAD Native Vessel   History:        Patient has prior history of Echocardiogram examinations, most                 recent 06/15/2020. Previous Myocardial Infarction and CAD; Risk                 Factors:Diabetes, Dyslipidemia and Hypertension.  Sonographer:    Harvie Junior Referring Phys: McGrath  Sonographer Comments: Technically difficult study due to poor echo windows and patient is obese. Image acquisition challenging due to patient body habitus and Image acquisition challenging due to respiratory motion. IMPRESSIONS  1. Left ventricular ejection fraction, by estimation, is 40 to 45%. The left ventricle has mildly decreased function. The left ventricle demonstrates global hypokinesis. There is mild left ventricular hypertrophy. Left ventricular diastolic parameters are consistent with Grade I diastolic dysfunction (impaired relaxation).  2. Right ventricular systolic function is normal. The right ventricular size is normal.  3. The mitral valve is normal in structure. No evidence of mitral valve regurgitation. No evidence of mitral stenosis.  4. The aortic valve is tricuspid. There is mild calcification of the aortic valve. There is mild thickening of the aortic valve. Aortic valve regurgitation is not visualized. No aortic stenosis is present.  5. The inferior vena cava is normal in size with greater than 50% respiratory variability, suggesting right atrial pressure of 3 mmHg. FINDINGS  Left Ventricle: Left ventricular ejection fraction, by estimation, is 40 to 45%. The left ventricle has mildly decreased function. The left ventricle demonstrates global hypokinesis. The left ventricular internal cavity size was normal in size. There is  mild left ventricular hypertrophy. Left ventricular diastolic parameters are consistent with Grade I diastolic dysfunction (impaired relaxation).  LV Wall Scoring: The apical inferior segment is akinetic. Right Ventricle: The right ventricular size is  normal. No increase in  right ventricular wall thickness. Right ventricular systolic function is normal. Left Atrium: Left atrial size was normal in size. Right Atrium: Right atrial size was normal in size. Pericardium: There is no evidence of pericardial effusion. Mitral Valve: The mitral valve is normal in structure. No evidence of mitral valve regurgitation. No evidence of mitral valve stenosis. Tricuspid Valve: The tricuspid valve is normal in structure. Tricuspid valve regurgitation is not demonstrated. No evidence of tricuspid stenosis. Aortic Valve: The aortic valve is tricuspid. There is mild calcification of the aortic valve. There is mild thickening of the aortic valve. Aortic valve regurgitation is not visualized. No aortic stenosis is present. Aortic valve mean gradient measures 3.0 mmHg. Aortic valve peak gradient measures 6.2 mmHg. Aortic valve area, by VTI measures 3.50 cm. Pulmonic Valve: The pulmonic valve was normal in structure. Pulmonic valve regurgitation is not visualized. No evidence of pulmonic stenosis. Aorta: The aortic root is normal in size and structure. Venous: The inferior vena cava is normal in size with greater than 50% respiratory variability, suggesting right atrial pressure of 3 mmHg. IAS/Shunts: No atrial level shunt detected by color flow Doppler.  LEFT VENTRICLE PLAX 2D LVIDd:         5.70 cm     Diastology LVIDs:         4.40 cm     LV e' medial:    5.22 cm/s LV PW:         1.20 cm     LV E/e' medial:  9.8 LV IVS:        1.30 cm     LV e' lateral:   9.68 cm/s LVOT diam:     2.20 cm     LV E/e' lateral: 5.3 LV SV:         74 LV SV Index:   29 LVOT Area:     3.80 cm  LV Volumes (MOD) LV vol d, MOD A2C: 62.0 ml LV vol d, MOD A4C: 96.4 ml LV vol s, MOD A2C: 40.0 ml LV vol s, MOD A4C: 55.1 ml LV SV MOD A2C:     22.0 ml LV SV MOD A4C:     96.4 ml LV SV MOD BP:      30.2 ml RIGHT VENTRICLE RV Basal diam:  3.70 cm RV Mid diam:    2.80 cm RV S prime:     9.36 cm/s TAPSE (M-mode): 1.6 cm LEFT ATRIUM              Index        RIGHT ATRIUM           Index LA diam:        3.20 cm 1.27 cm/m   RA Area:     14.30 cm LA Vol (A2C):   47.1 ml 18.63 ml/m  RA Volume:   32.40 ml  12.82 ml/m LA Vol (A4C):   55.4 ml 21.92 ml/m LA Biplane Vol: 55.6 ml 22.00 ml/m  AORTIC VALVE                    PULMONIC VALVE AV Area (Vmax):    2.63 cm     PV Vmax:       0.91 m/s AV Area (Vmean):   2.39 cm     PV Peak grad:  3.3 mmHg AV Area (VTI):     3.50 cm AV Vmax:           124.00 cm/s  AV Vmean:          87.300 cm/s AV VTI:            0.211 m AV Peak Grad:      6.2 mmHg AV Mean Grad:      3.0 mmHg LVOT Vmax:         85.70 cm/s LVOT Vmean:        54.800 cm/s LVOT VTI:          0.194 m LVOT/AV VTI ratio: 0.92  AORTA Ao Root diam: 3.90 cm Ao Asc diam:  3.60 cm MITRAL VALVE MV Area (PHT): 3.17 cm    SHUNTS MV Decel Time: 239 msec    Systemic VTI:  0.19 m MR Peak grad: 11.7 mmHg    Systemic Diam: 2.20 cm MR Vmax:      171.00 cm/s MV E velocity: 51.10 cm/s MV A velocity: 59.60 cm/s MV E/A ratio:  0.86 Candee Furbish MD Electronically signed by Candee Furbish MD Signature Date/Time: 06/07/2022/4:09:50 PM    Final    CARDIAC CATHETERIZATION  Result Date: 06/07/2022   Prox LAD lesion is 70% stenosed.   Dist LAD lesion is 40% stenosed.   Dist Cx lesion is 100% stenosed.   Ost Cx lesion is 80% stenosed.   Dist RCA lesion is 40% stenosed.   1st Diag-2 lesion is 60% stenosed.   1st Mrg lesion is 50% stenosed.   2nd Mrg lesion is 99% stenosed.   RPDA lesion is 99% stenosed.   Mid RCA to Dist RCA lesion is 60% stenosed.   1st Diag-1 lesion is 100% stenosed.   Mid Graft lesion is 100% stenosed.   SVG graft was visualized by angiography.   SVG graft was visualized by angiography. Three vessel CAD s/p 4V CABG with 2/4 patent grafts The mid LAD has a moderate stenosis. The LIMA graft to the mid LAD is atretic. Patent vein graft to the Diagonal branch The Circumflex has a severe ostial stenosis. Patent vein graft to OM. The large dominant RCA has a  moderately severe mid stenosis. The PDA is occluded. Collaterals to the distal Circumflex through the PDA. The RCA is unchanged from prior cath. The vein graft to the PDA is now occluded. Recommendations: He is pain free. The LAD has good flow. Patent grafts to the OM and Diagonal. PCI of the mid RCA could be considered if he has recurrent angina. Second STEMI activation prior to this case so I did not plan PCI of this chronic lesion. Will admit to the ICU tonight. Will load with Plavix now and continue ASA/Plavix. High intensity statin and beta blocker. Echo later today.    Cardiac Studies     Prox LAD lesion is 70% stenosed.   Dist LAD lesion is 40% stenosed.   Dist Cx lesion is 100% stenosed.   Ost Cx lesion is 80% stenosed.   Dist RCA lesion is 40% stenosed.   1st Diag-2 lesion is 60% stenosed.   1st Mrg lesion is 50% stenosed.   2nd Mrg lesion is 99% stenosed.   RPDA lesion is 99% stenosed.   Mid RCA to Dist RCA lesion is 60% stenosed.   1st Diag-1 lesion is 100% stenosed.   Mid Graft lesion is 100% stenosed.   SVG graft was visualized by angiography.   SVG graft was visualized by angiography.   Three vessel CAD s/p 4V CABG with 2/4 patent grafts The mid LAD has a moderate stenosis. The LIMA graft to the  mid LAD is atretic. Patent vein graft to the Diagonal branch The Circumflex has a severe ostial stenosis. Patent vein graft to OM.  The large dominant RCA has a moderately severe mid stenosis. The PDA is occluded. Collaterals to the distal Circumflex through the PDA. The RCA is unchanged from prior cath. The vein graft to the PDA is now occluded.    Recommendations: He is pain free. The LAD has good flow. Patent grafts to the OM and Diagonal. PCI of the mid RCA could be considered if he has recurrent angina. Second STEMI activation prior to this case so I did not plan PCI of this chronic lesion. Will admit to the ICU tonight. Will load with Plavix now and continue ASA/Plavix. High  intensity statin and beta blocker. Echo later today.     Assessment & Plan    Principal Problem:   Acute ST elevation myocardial infarction (STEMI) of inferior wall (HCC) Active Problems:   Coronary artery disease   Hypertension   Hyperlipidemia   Diabetes ([facility])   ST elevation myocardial infarction (STEMI) of inferior wall (HCC)  Acute inferior STEMI HTN HLD DM2 -SSI  Medical therapy for coronary disease.  Potentially could add nitrates.  Bradycardia is currently limiting for up titration of beta-blockers. Tolerating amlodipine Transfer to floor We will add ezetimibe    For questions or updates, please contact [place] Please consult [email or web address] for contact info under        Signed, Virl Axe, MD  06/08/2022, 8:18 AM

## 2022-06-08 NOTE — Progress Notes (Signed)
Pt declined ambulation.  

## 2022-06-09 ENCOUNTER — Telehealth: Payer: Self-pay | Admitting: Physician Assistant

## 2022-06-09 ENCOUNTER — Encounter (HOSPITAL_COMMUNITY): Payer: Self-pay | Admitting: Cardiovascular Disease

## 2022-06-09 DIAGNOSIS — I2119 ST elevation (STEMI) myocardial infarction involving other coronary artery of inferior wall: Secondary | ICD-10-CM | POA: Diagnosis not present

## 2022-06-09 DIAGNOSIS — I5021 Acute systolic (congestive) heart failure: Secondary | ICD-10-CM

## 2022-06-09 DIAGNOSIS — N1832 Chronic kidney disease, stage 3b: Secondary | ICD-10-CM

## 2022-06-09 DIAGNOSIS — I255 Ischemic cardiomyopathy: Secondary | ICD-10-CM

## 2022-06-09 LAB — BASIC METABOLIC PANEL
Anion gap: 9 (ref 5–15)
BUN: 28 mg/dL — ABNORMAL HIGH (ref 8–23)
CO2: 25 mmol/L (ref 22–32)
Calcium: 8.9 mg/dL (ref 8.9–10.3)
Chloride: 106 mmol/L (ref 98–111)
Creatinine, Ser: 1.64 mg/dL — ABNORMAL HIGH (ref 0.61–1.24)
GFR, Estimated: 43 mL/min — ABNORMAL LOW (ref >60)
Glucose, Bld: 190 mg/dL — ABNORMAL HIGH (ref 70–99)
Potassium: 4.1 mmol/L (ref 3.5–5.1)
Sodium: 140 mmol/L (ref 135–145)

## 2022-06-09 LAB — HEPATIC FUNCTION PANEL
ALT: 17 U/L (ref 0–44)
AST: 30 U/L (ref 15–41)
Albumin: 3.7 g/dL (ref 3.5–5.0)
Alkaline Phosphatase: 84 U/L (ref 38–126)
Bilirubin, Direct: 0.2 mg/dL (ref 0.0–0.2)
Indirect Bilirubin: 0.7 mg/dL (ref 0.3–0.9)
Total Bilirubin: 0.9 mg/dL (ref 0.3–1.2)
Total Protein: 7.1 g/dL (ref 6.5–8.1)

## 2022-06-09 LAB — GLUCOSE, CAPILLARY
Glucose-Capillary: 172 mg/dL — ABNORMAL HIGH (ref 70–99)
Glucose-Capillary: 175 mg/dL — ABNORMAL HIGH (ref 70–99)

## 2022-06-09 MED ORDER — TICAGRELOR 90 MG PO TABS: 90.0000 mg | ORAL_TABLET | Freq: Two times a day (BID) | ORAL | Status: DC

## 2022-06-09 MED ORDER — AMLODIPINE BESYLATE 5 MG PO TABS: 5.0000 mg | ORAL_TABLET | Freq: Every day | ORAL | Status: DC

## 2022-06-09 MED ORDER — TICAGRELOR 90 MG PO TABS: 90.0000 mg | ORAL_TABLET | Freq: Two times a day (BID) | ORAL | 11 refills | Status: AC

## 2022-06-09 MED ORDER — SPIRONOLACTONE 12.5 MG HALF TABLET
12.5000 mg | ORAL_TABLET | Freq: Every day | ORAL | Status: DC
  Administered 2022-06-09: 12.5 mg via ORAL
  Filled 2022-06-09: qty 1

## 2022-06-09 MED ORDER — AMLODIPINE BESYLATE 2.5 MG PO TABS
2.5000 mg | ORAL_TABLET | Freq: Every day | ORAL | Status: DC
  Administered 2022-06-09: 2.5 mg via ORAL
  Filled 2022-06-09: qty 1

## 2022-06-09 MED ORDER — DAPAGLIFLOZIN PROPANEDIOL 10 MG PO TABS: 10.0000 mg | ORAL_TABLET | Freq: Every day | ORAL | 5 refills | Status: AC

## 2022-06-09 MED ORDER — NITROGLYCERIN 0.4 MG SL SUBL: 0.4000 mg | SUBLINGUAL_TABLET | SUBLINGUAL | 3 refills | Status: AC | PRN

## 2022-06-09 MED ORDER — METOPROLOL TARTRATE 25 MG PO TABS: 25.0000 mg | ORAL_TABLET | Freq: Two times a day (BID) | ORAL | 1 refills | Status: DC

## 2022-06-09 MED ORDER — TICAGRELOR 90 MG PO TABS
180.0000 mg | ORAL_TABLET | Freq: Once | ORAL | Status: AC
  Administered 2022-06-09: 180 mg via ORAL
  Filled 2022-06-09: qty 2

## 2022-06-09 MED ORDER — EZETIMIBE 10 MG PO TABS: 10.0000 mg | ORAL_TABLET | Freq: Every day | ORAL | 5 refills | Status: AC

## 2022-06-09 MED ORDER — AMLODIPINE BESYLATE 5 MG PO TABS: 5.0000 mg | ORAL_TABLET | Freq: Every day | ORAL | 5 refills | Status: AC

## 2022-06-09 NOTE — Discharge Summary (Signed)
Discharge Summary    Patient ID: TIMOTHY TRUDELL MRN: [id number]; DOB: [birth year]  Admit date: 06/06/2022 Discharge date: 06/09/2022  PCP:  Street, [place], [place] Providers Cardiologist:  Shirlee More, MD        Discharge Diagnoses    Principal Problem:   Acute ST elevation myocardial infarction (STEMI) of inferior wall [facility]) Active Problems:   Coronary artery disease   Hypertension   Hyperlipidemia   Diabetes ([place])   Acute HFrEF (heart failure with reduced ejection fraction) ([place])   Ischemic cardiomyopathy   Chronic kidney disease, stage 3b [facility])    Diagnostic Studies/Procedures    Cardiac Cath 06/07/22   Prox LAD lesion is 70% stenosed.   Dist LAD lesion is 40% stenosed.   Dist Cx lesion is 100% stenosed.   Ost Cx lesion is 80% stenosed.   Dist RCA lesion is 40% stenosed.   1st Diag-2 lesion is 60% stenosed.   1st Mrg lesion is 50% stenosed.   2nd Mrg lesion is 99% stenosed.   RPDA lesion is 99% stenosed.   Mid RCA to Dist RCA lesion is 60% stenosed.   1st Diag-1 lesion is 100% stenosed.   Mid Graft lesion is 100% stenosed.   SVG graft was visualized by angiography.   SVG graft was visualized by angiography.   Three vessel CAD s/p 4V CABG with 2/4 patent grafts The mid LAD has a moderate stenosis. The LIMA graft to the mid LAD is atretic. Patent vein graft to the Diagonal branch The Circumflex has a severe ostial stenosis. Patent vein graft to OM.  The large dominant RCA has a moderately severe mid stenosis. The PDA is occluded. Collaterals to the distal Circumflex through the PDA. The RCA is unchanged from prior cath. The vein graft to the PDA is now occluded.    Recommendations: He is pain free. The LAD has good flow. Patent grafts to the OM and Diagonal. PCI of the mid RCA could be considered if he has recurrent angina. Second STEMI activation prior to this case so I did not plan PCI of this chronic lesion. Will admit to the ICU  tonight. Will load with Plavix now and continue ASA/Plavix. High intensity statin and beta blocker. Echo later today.    2D Echo 06/07/22  1. Left ventricular ejection fraction, by estimation, is 40 to 45%. The  left ventricle has mildly decreased function. The left ventricle  demonstrates global hypokinesis. There is mild left ventricular  hypertrophy. Left ventricular diastolic parameters  are consistent with Grade I diastolic dysfunction (impaired relaxation).   2. Right ventricular systolic function is normal. The right ventricular  size is normal.   3. The mitral valve is normal in structure. No evidence of mitral valve  regurgitation. No evidence of mitral stenosis.   4. The aortic valve is tricuspid. There is mild calcification of the  aortic valve. There is mild thickening of the aortic valve. Aortic valve  regurgitation is not visualized. No aortic stenosis is present.   5. The inferior vena cava is normal in size with greater than 50%  respiratory variability, suggesting right atrial pressure of 3 mmHg.   _____________   History of Present Illness     PEDER ALLUMS is a 76 y.o. male with HTN, HLD, T2DM, CAD s/p 3V CABG in 2021, CKD stage 3b who presented to the hospital with shoulder pain and arm pain with possible STEMI. On day of admission, he started having  on/off shoulder pain and chest pain. EMS was called; and ST elevations in III and AVF were noted with reciprocal depressions in lateral leads. CODE STEMI was called. When he came to the ED, he was HDS. Normal BP. Denied chest pain. He was taken directly to the cardiac cath lab.  Hospital Course     1. Acute inferior STEMI/CAD - cardiac cath performed with findings above with 2/4 patent grafts and native disease as outlined -> medical therapy recommended, loaded with Plavix, to consider PCI of mid RCA if he had recurrent angina - on day of DC, Dr. Caryl Comes reviewed literature and prior notes and recommended switching  Plavix to Brilinta -> per his d/w pharmacy, loaded with 153m this AM then to continue 996mBID - amlodipine added for antianginal effect - metoprolol decreased to 2557mID as otherwise limited by bradycardia - ezetimibe added  2. Ischemic cardiomyopathy/acute HFrEF - echo 06/07/22 EF 40-45%, global HK, G1DD, normal RV - dapagliflozin increased to 7m40mily - initial plan was to start spironolactone 12.5mg 22mly but Cr came back 1.64 therefore Dr. KleinCaryl Comesmmended to hold off pending OP f/u, same with ACEI/ARB/ARNI and prior to admission Lasix/KCl  3. Essential HTN - managed in context above  4. Hyperlipidemia - ezetimibe added to statin - LDL 76, goal <55 - if the patient is tolerating statin at time of follow-up appointment, would consider rechecking liver function/lipid panel in 6-8 weeks - LFTs sent day of DC  5. Diabetes type 2, A1C 7.0 - to resume home insulin at discharge - dapagliflozin increased to 7mg 54my  6. CKD 3b - admit Cr 1.29 (1.10 was istat and likely inaccurate) -> 1.58 -> 1.64, prior OP values in 2021 were 1.2-1.7 - will need OP f/u  Dr. Klein Caryl Comeseen and examined the patient today and feels he is stable for discharge. I asked nurse to have care manager give 30 day free Brilinta card prior to DC, [place] Patient relayed to Dr. Klein Caryl Comeses not wish to f/u with Dr. MunleyBettina Gavia forward and he plans to discuss this further with his PCP. In the interim he is agreeable to [place]. Nursing staff also identified potential pressure injury but in tact skin. Per Dr. Klein,Caryl Comesmmend conservative local wound care and PCP f/u.      Did the patient have an acute coronary syndrome (MI, NSTEMI, STEMI, etc) this admission?:  Yes                               AHA/ACC Clinical Performance & Quality Measures: Aspirin prescribed? - Yes ADP Receptor Inhibitor (Plavix/Clopidogrel,  Brilinta/Ticagrelor or Effient/Prasugrel) prescribed (includes medically managed patients)? - Yes Beta Blocker prescribed? - Yes High Intensity Statin (Lipitor 40-80mg o2mestor 20-40mg) p50mribed? - Yes EF assessed during THIS hospitalization? - Yes For EF <40%, was ACEI/ARB prescribed? - Not Applicable (EF >/= 40%) For09% <40%, Aldosterone Antagonist (Spironolactone or Eplerenone) prescribed? - Not Applicable (EF >/= 40%) Car98%c Rehab Phase II ordered (including medically managed patients)? - Yes       The patient will be scheduled for a TOC follow up appointment in 8 days.  A message has been sent to the [facility] Pool at the office where the patient should be seen for follow up.  _____________  Discharge Vitals Blood pressure 139/86, pulse  77, temperature 98.2 F (36.8 C), temperature source Oral, resp. rate 18, height _0  (1.88 m), weight 129.2 kg, SpO2 98 %.  Filed Weights   06/08/22 0700 06/08/22 1210 06/09/22 0501  Weight: 128.2 kg 128.8 kg 129.2 kg    Labs & Radiologic Studies    CBC Recent Labs    06/07/22 0330 06/08/22 0729  WBC 8.8 8.8  HGB 16.2 16.1  HCT 47.2 47.4  MCV 83.0 84.5  PLT 139* 376*   Basic Metabolic Panel Recent Labs    06/08/22 0729 06/09/22 1008  NA 141 140  K 3.9 4.1  CL 106 106  CO2 26 25  GLUCOSE 196* 190*  BUN 24* 28*  CREATININE 1.58* 1.64*  CALCIUM 9.0 8.9    High Sensitivity Troponin:   Recent Labs  Lab 06/07/22 0330 06/07/22 1308  TROPONINIHS 3,464* >24,000*    BNP Invalid input(s): "POCBNP" D-Dimer No results for input(s): "DDIMER" in the last 72 hours. Hemoglobin A1C Recent Labs    06/07/22 0330  HGBA1C 7.0*   Fasting Lipid Panel Recent Labs    06/08/22 0729  CHOL 137  HDL 37*  LDLCALC 76  TRIG 119  CHOLHDL 3.7   Thyroid Function Tests No results for input(s): "TSH", "T4TOTAL", "T3FREE", "THYROIDAB" in the last 72 hours.  Invalid input(s): "FREET3" _____________  ECHOCARDIOGRAM  COMPLETE  Result Date: 06/07/2022    ECHOCARDIOGRAM REPORT   Patient Name:   OSTEN JANEK Date of Exam: 06/07/2022 Medical Rec #:  [id number]        Height:       74.0 in Accession #:    [id number]       Weight:       285.3 lb Date of Birth:  05/11/1946        BSA:          2.528 m Patient Age:    78 years         BP:           136/84 mmHg Patient Gender: M                HR:           60 bpm. Exam Location:  Inpatient Procedure: 2D Echo and Intracardiac Opacification Agent Indications:    CAD Native Vessel  History:        Patient has prior history of Echocardiogram examinations, most                 recent 06/15/2020. Previous Myocardial Infarction and CAD; Risk                 Factors:Diabetes, Dyslipidemia and Hypertension.  Sonographer:    Harvie Junior Referring Phys: [place]  Sonographer Comments: Technically difficult study due to poor echo windows and patient is obese. Image acquisition challenging due to patient body habitus and Image acquisition challenging due to respiratory motion. IMPRESSIONS  1. Left ventricular ejection fraction, by estimation, is 40 to 45%. The left ventricle has mildly decreased function. The left ventricle demonstrates global hypokinesis. There is mild left ventricular hypertrophy. Left ventricular diastolic parameters are consistent with Grade I diastolic dysfunction (impaired relaxation).  2. Right ventricular systolic function is normal. The right ventricular size is normal.  3. The mitral valve is normal in structure. No evidence of mitral valve regurgitation. No evidence of mitral stenosis.  4. The aortic valve is tricuspid. There is mild calcification of the aortic valve. There is mild thickening  of the aortic valve. Aortic valve regurgitation is not visualized. No aortic stenosis is present.  5. The inferior vena cava is normal in size with greater than 50% respiratory variability, suggesting right atrial pressure of 3 mmHg. FINDINGS  Left Ventricle:  Left ventricular ejection fraction, by estimation, is 40 to 45%. The left ventricle has mildly decreased function. The left ventricle demonstrates global hypokinesis. The left ventricular internal cavity size was normal in size. There is  mild left ventricular hypertrophy. Left ventricular diastolic parameters are consistent with Grade I diastolic dysfunction (impaired relaxation).  LV Wall Scoring: The apical inferior segment is akinetic. Right Ventricle: The right ventricular size is normal. No increase in right ventricular wall thickness. Right ventricular systolic function is normal. Left Atrium: Left atrial size was normal in size. Right Atrium: Right atrial size was normal in size. Pericardium: There is no evidence of pericardial effusion. Mitral Valve: The mitral valve is normal in structure. No evidence of mitral valve regurgitation. No evidence of mitral valve stenosis. Tricuspid Valve: The tricuspid valve is normal in structure. Tricuspid valve regurgitation is not demonstrated. No evidence of tricuspid stenosis. Aortic Valve: The aortic valve is tricuspid. There is mild calcification of the aortic valve. There is mild thickening of the aortic valve. Aortic valve regurgitation is not visualized. No aortic stenosis is present. Aortic valve mean gradient measures 3.0 mmHg. Aortic valve peak gradient measures 6.2 mmHg. Aortic valve area, by VTI measures 3.50 cm. Pulmonic Valve: The pulmonic valve was normal in structure. Pulmonic valve regurgitation is not visualized. No evidence of pulmonic stenosis. Aorta: The aortic root is normal in size and structure. Venous: The inferior vena cava is normal in size with greater than 50% respiratory variability, suggesting right atrial pressure of 3 mmHg. IAS/Shunts: No atrial level shunt detected by color flow Doppler.  LEFT VENTRICLE PLAX 2D LVIDd:         5.70 cm     Diastology LVIDs:         4.40 cm     LV e' medial:    5.22 cm/s LV PW:         1.20 cm     LV E/e'  medial:  9.8 LV IVS:        1.30 cm     LV e' lateral:   9.68 cm/s LVOT diam:     2.20 cm     LV E/e' lateral: 5.3 LV SV:         74 LV SV Index:   29 LVOT Area:     3.80 cm  LV Volumes (MOD) LV vol d, MOD A2C: 62.0 ml LV vol d, MOD A4C: 96.4 ml LV vol s, MOD A2C: 40.0 ml LV vol s, MOD A4C: 55.1 ml LV SV MOD A2C:     22.0 ml LV SV MOD A4C:     96.4 ml LV SV MOD BP:      30.2 ml RIGHT VENTRICLE RV Basal diam:  3.70 cm RV Mid diam:    2.80 cm RV S prime:     9.36 cm/s TAPSE (M-mode): 1.6 cm LEFT ATRIUM             Index        RIGHT ATRIUM           Index LA diam:        3.20 cm 1.27 cm/m   RA Area:     14.30 cm LA Vol (A2C):   47.1 ml 18.63 ml/m  RA Volume:   32.40 ml  12.82 ml/m LA Vol (A4C):   55.4 ml 21.92 ml/m LA Biplane Vol: 55.6 ml 22.00 ml/m  AORTIC VALVE                    PULMONIC VALVE AV Area (Vmax):    2.63 cm     PV Vmax:       0.91 m/s AV Area (Vmean):   2.39 cm     PV Peak grad:  3.3 mmHg AV Area (VTI):     3.50 cm AV Vmax:           124.00 cm/s AV Vmean:          87.300 cm/s AV VTI:            0.211 m AV Peak Grad:      6.2 mmHg AV Mean Grad:      3.0 mmHg LVOT Vmax:         85.70 cm/s LVOT Vmean:        54.800 cm/s LVOT VTI:          0.194 m LVOT/AV VTI ratio: 0.92  AORTA Ao Root diam: 3.90 cm Ao Asc diam:  3.60 cm MITRAL VALVE MV Area (PHT): 3.17 cm    SHUNTS MV Decel Time: 239 msec    Systemic VTI:  0.19 m MR Peak grad: 11.7 mmHg    Systemic Diam: 2.20 cm MR Vmax:      171.00 cm/s MV E velocity: 51.10 cm/s MV A velocity: 59.60 cm/s MV E/A ratio:  0.86 Candee Furbish MD Electronically signed by Candee Furbish MD Signature Date/Time: 06/07/2022/4:09:50 PM    Final    CARDIAC CATHETERIZATION  Result Date: 06/07/2022   Prox LAD lesion is 70% stenosed.   Dist LAD lesion is 40% stenosed.   Dist Cx lesion is 100% stenosed.   Ost Cx lesion is 80% stenosed.   Dist RCA lesion is 40% stenosed.   1st Diag-2 lesion is 60% stenosed.   1st Mrg lesion is 50% stenosed.   2nd Mrg lesion is 99% stenosed.    RPDA lesion is 99% stenosed.   Mid RCA to Dist RCA lesion is 60% stenosed.   1st Diag-1 lesion is 100% stenosed.   Mid Graft lesion is 100% stenosed.   SVG graft was visualized by angiography.   SVG graft was visualized by angiography. Three vessel CAD s/p 4V CABG with 2/4 patent grafts The mid LAD has a moderate stenosis. The LIMA graft to the mid LAD is atretic. Patent vein graft to the Diagonal branch The Circumflex has a severe ostial stenosis. Patent vein graft to OM. The large dominant RCA has a moderately severe mid stenosis. The PDA is occluded. Collaterals to the distal Circumflex through the PDA. The RCA is unchanged from prior cath. The vein graft to the PDA is now occluded. Recommendations: He is pain free. The LAD has good flow. Patent grafts to the OM and Diagonal. PCI of the mid RCA could be considered if he has recurrent angina. Second STEMI activation prior to this case so I did not plan PCI of this chronic lesion. Will admit to the ICU tonight. Will load with Plavix now and continue ASA/Plavix. High intensity statin and beta blocker. Echo later today.   Disposition   Pt is being discharged home today in good condition.  Follow-up Plans & Appointments     Follow-up Information     Charlie Pitter, PA-C Follow  up.   Specialties: Cardiology, Radiology Why: [place] location in [place] - Monday Jun 17, 2022 at 3:35 PM (Arrive by 3:20 PM). We will get your follow-up labs at this visit. You do not need to be fasting. Contact information: [address] [place] [address] [place] [address] [phone]                Discharge Instructions     Amb Referral to Cardiac Rehabilitation   Complete by: As directed    Diagnosis: STEMI   After initial evaluation and assessments completed: Virtual Based Care may be provided alone or in conjunction with Phase 2 Cardiac Rehab based on patient barriers.: Yes   Intensive Cardiac Rehabilitation (ICR) [place]  location only OR Traditional Cardiac Rehabilitation (TCR) *If criteria for ICR are not met will enroll in TCR [facility] only): Yes   Diet - low sodium heart healthy   Complete by: As directed    Discharge instructions   Complete by: As directed    Please take the time to review the multiple medicine changes that were made here.  You'll note we stopped your furosemide and potassium supplement for now. Do not throw these away but set them aside. We will plan to recheck your kidney function as an outpatient to help guide next steps. Your kidney function looks to be chronically abnormal including back to 2021, so we will follow this closely at your next visit.  One of your heart tests showed weakness of the heart muscle this admission. This may make you more susceptible to weight gain from fluid retention, which can lead to symptoms that we call heart failure. Please follow these special instructions:  For patients with congestive heart failure, we give them these special instructions:  1. Follow a low-salt diet - you should be eating no more than 2,061m of sodium per day. This does not necessarily just apply to the salt you put on top of prepared food, but the sodium already in food. Processed food, frozen meals, canned goods, deli meat, and bread can have a surprising amount of sodium per serving so be sure to track this daily. 2. Watch your fluid intake. In general, you should not be taking in more than 2 liters of fluid per day (close to 64 oz of fluid per day). This includes sources of water in foods like soup, coffee, tea, milk, etc. It's important to stay hydrated but NOT to excess. 2. Weigh yourself on the same scale at same time of day and keep a log. 3. Call your doctor: (Anytime you feel any of the following symptoms)  - 3lb weight gain overnight or 5lb within a few days - Shortness of breath, with or without a dry hacking cough  - Swelling in the hands, feet or stomach  - If you have to sleep  on extra pillows at night in order to breathe   IT IS IMPORTANT TO LET YOUR DOCTOR KNOW EARLY ON IF YOU ARE HAVING SYMPTOMS SO WE CAN HELP YOU!   Discharge wound care:   Complete by: As directed    Please keep any wounds clean and dry, and schedule follow up with your primary care provider. If you notice increased pain, swelling, bleeding or pus, please seek care.   Increase activity slowly   Complete by: As directed    No driving for 2 weeks. No lifting over 5 lbs for 1 week then 10 lbs for 4 weeks. No sexual activity for  4 weeks. You may not return to work until cleared by your cardiologist (if you presently work). Keep procedure site clean & dry. If you notice increased pain, swelling, bleeding or pus, call/return!  You may shower, but no soaking baths/hot tubs/pools for 1 week.        Discharge Medications   Allergies as of 06/09/2022   No Known Allergies      Medication List     STOP taking these medications    furosemide 20 MG tablet Commonly known as: LASIX   potassium chloride SA 20 MEQ tablet Commonly known as: KLOR-CON M       TAKE these medications    amLODipine 5 MG tablet Commonly known as: NORVASC Take 1 tablet (5 mg total) by mouth daily. Start taking on: June 10, 2022   aspirin EC 81 MG tablet Take 81 mg by mouth daily. Swallow whole.   atorvastatin 80 MG tablet Commonly known as: LIPITOR Take 1 tablet (80 mg total) by mouth daily.   dapagliflozin propanediol 10 MG Tabs tablet Commonly known as: Farxiga Take 1 tablet (10 mg total) by mouth daily. What changed:  medication strength how much to take   ezetimibe 10 MG tablet Commonly known as: ZETIA Take 1 tablet (10 mg total) by mouth daily. Start taking on: June 10, 2022   metoprolol tartrate 25 MG tablet Commonly known as: LOPRESSOR Take 1 tablet (25 mg total) by mouth 2 (two) times daily. What changed: Another medication with the same name was removed. Continue taking this  medication, and follow the directions you see here.   nitroGLYCERIN 0.4 MG SL tablet Commonly known as: Nitrostat Place 1 tablet (0.4 mg total) under the tongue every 5 (five) minutes as needed for chest pain (up to 3 doses. If taking 3rd dose call 911).   ticagrelor 90 MG Tabs tablet Commonly known as: BRILINTA Take 1 tablet (90 mg total) by mouth 2 (two) times daily.   Tresiba 100 UNIT/ML Soln Generic drug: Insulin Degludec Inject 28 Units into the skin daily.               Discharge Care Instructions  (From admission, onward)           Start     Ordered   06/09/22 0000  Discharge wound care:       Comments: Please keep any wounds clean and dry, and schedule follow up with your primary care provider. If you notice increased pain, swelling, bleeding or pus, please seek care.   06/09/22 1107               Outstanding Labs/Studies   Needs BMET in followup as well as statin f/u arranged at time of OV  Duration of Discharge Encounter   Greater than 30 minutes including physician time.  Signed, Charlie Pitter, PA-C 06/09/2022, 11:12 AM

## 2022-06-09 NOTE — Plan of Care (Signed)
  Problem: Skin Integrity: Goal: Risk for impaired skin integrity will decrease Outcome: Completed/Met

## 2022-06-09 NOTE — TOC Transition Note (Signed)
Transition of Care [facility]) - CM/SW Discharge Note   Patient Details  Name: KRISHAWN VANDERWEELE MRN: [id number] Date of Birth: May 21, 1946  [facility]) CM/SW Contact:  Carles Collet, RN Phone Number: 06/09/2022, 11:40 AM   Clinical Narrative:     Damaris Schooner w patient at bedside.  He was provided with Brilinta card to cover first 30 days. Discussed patient assistance for Rx as he gets it for other medications and he will follow up with the doctor's office to start that process Monday. His car is at [place] and he does not have a ride to get back, also could not set UBER, or LYFT, as he doesn't have his cell phone with him. Patient does not have financial means to pay for cab, he was provided with a cab voucher. No other TOC needs identified        Patient Goals and CMS Choice        Discharge Placement                       Discharge Plan and Services                                     Social Determinants of Health (SDOH) Interventions     Readmission Risk Interventions     No data to display

## 2022-06-09 NOTE — Telephone Encounter (Signed)
   Attention TOC pool,  This patient will need a TOC phone call after discharge. They are being discharged today. Follow-up appointment has already been arranged with: me on 11/6 They are a patient of Shirlee More, MD though per Dr. Caryl Comes patient does not wish to return to Dr. Bettina Gavia, pt plans to discuss with PCP who to see long term  Thank you! Charlie Pitter, PA-C

## 2022-06-09 NOTE — Progress Notes (Signed)
Rounding Note    Patient Name: Larry May Date of Encounter: 06/09/2022  [facility] Cardiologist: Norman Herrlich, MD    Patient Profile     76 y.o. male with history of HTN, HLD, DM2, 3v CABG in 2021 (L thoracic artery to LAD and venous grafts to PDA and OM1 and first diagonal branch) who presented after having intermittent shoulder and arm pain, without chest pain, found to have evidence of inferior STEMI on EKG cATH>> I Significant disease burden in native and graft vessels, unfavorable for PCI  >>Medical therapy  Echo  EF40-45% Subjective   Without chest pain   Not inclined to return to Dr. Dulce Sellar  Inpatient Medications    Scheduled Meds:  amLODipine  5 mg Oral Daily   aspirin EC  81 mg Oral Daily   atorvastatin  80 mg Oral Daily   Chlorhexidine Gluconate Cloth  6 each Topical Daily   clopidogrel  75 mg Oral Daily   dapagliflozin propanediol  10 mg Oral Daily   ezetimibe  10 mg Oral Daily   insulin aspart  0-15 Units Subcutaneous TID WC   metoprolol tartrate  25 mg Oral BID   sodium chloride flush  3 mL Intravenous Q12H   Continuous Infusions:  sodium chloride     PRN Meds: sodium chloride, acetaminophen, ondansetron (ZOFRAN) IV, mouth rinse, sodium chloride flush   Vital Signs    Vitals:   06/08/22 1210 06/08/22 1548 06/08/22 2059 06/09/22 0501  BP:  121/78 134/79 139/86  Pulse:  62 (!) 110 77  Resp:  18 18 18   Temp:  98.4 F (36.9 C) 98.3 F (36.8 C) 98.2 F (36.8 C)  TempSrc:  Oral Oral Oral  SpO2:  96% 97% 98%  Weight: 128.8 kg   129.2 kg  Height: 6\' 2"  (1.88 m)       Intake/Output Summary (Last 24 hours) at 06/09/2022 0923 Last data filed at 06/08/2022 2100 Gross per 24 hour  Intake 220 ml  Output --  Net 220 ml       06/09/2022    5:01 AM 06/08/2022   12:10 PM 06/08/2022    7:00 AM  Last 3 Weights  Weight (lbs) 284 lb 12.8 oz 283 lb 14.4 oz 282 lb 10.1 oz  Weight (kg) 129.184 kg 128.776 kg 128.2 kg      Telemetry     Sinus personally Reviewed  ECG    No new ECG today  Physical Exam    BP 139/86 (BP Location: Right Arm)   Pulse 77   Temp 98.2 F (36.8 C) (Oral)   Resp 18   Ht 6\' 2"  (1.88 m)   Wt 129.2 kg   SpO2 98%   BMI 36.57 kg/m  Well developed and nourished in no acute distress HENT normal Neck supple  Clear Regular rate and rhythm, no murmurs or gallops Abd-soft with active BS No Clubbing cyanosis edema Skin-warm and dry A & Oriented  Grossly normal sensory and motor function   Labs    High Sensitivity Troponin:   Recent Labs  Lab 06/07/22 0330 06/07/22 1308  TROPONINIHS 3,464* >24,000*      Chemistry Recent Labs  Lab 06/07/22 0024 06/07/22 0330 06/08/22 0729  NA 136 140 141  K 3.9 4.8 3.9  CL 101 102 106  CO2  --  25 26  GLUCOSE 188* 184* 196*  BUN 21 19 24*  CREATININE 1.10 1.29* 1.58*  CALCIUM  --  9.6 9.0  GFRNONAA  --  57* 44*  ANIONGAP  --  13 9     Lipids  Recent Labs  Lab 06/08/22 0729  CHOL 137  TRIG 119  HDL 37*  LDLCALC 76  CHOLHDL 3.7    Hematology Recent Labs  Lab 06/07/22 0024 06/07/22 0330 06/08/22 0729  WBC  --  8.8 8.8  RBC  --  5.69 5.61  HGB 15.3 16.2 16.1  HCT 45.0 47.2 47.4  MCV  --  83.0 84.5  MCH  --  28.5 28.7  MCHC  --  34.3 34.0  RDW  --  14.1 14.5  PLT  --  139* 147*    Thyroid No results for input(s): "TSH", "FREET4" in the last 168 hours.  BNPNo results for input(s): "BNP", "PROBNP" in the last 168 hours.  DDimer No results for input(s): "DDIMER" in the last 168 hours.   Radiology    ECHOCARDIOGRAM COMPLETE  Result Date: 06/07/2022    ECHOCARDIOGRAM REPORT   Patient Name:   Larry May Date of Exam: 06/07/2022 Medical Rec #:  [id number]        Height:       74.0 in Accession #:    [id number]       Weight:       285.3 lb Date of Birth:  [birth year]        BSA:          2.528 m Patient Age:    76 years         BP:           136/84 mmHg Patient Gender: M                HR:           60 bpm. Exam  Location:  Inpatient Procedure: 2D Echo and Intracardiac Opacification Agent Indications:    CAD Native Vessel  History:        Patient has prior history of Echocardiogram examinations, most                 recent 06/15/2020. Previous Myocardial Infarction and CAD; Risk                 Factors:Diabetes, Dyslipidemia and Hypertension.  Sonographer:    Cathie HoopsWendy Porter Referring Phys: [phone] [facility]STOP[facility] D [facility]  Sonographer Comments: Technically difficult study due to poor echo windows and patient is obese. Image acquisition challenging due to patient body habitus and Image acquisition challenging due to respiratory motion. IMPRESSIONS  1. Left ventricular ejection fraction, by estimation, is 40 to 45%. The left ventricle has mildly decreased function. The left ventricle demonstrates global hypokinesis. There is mild left ventricular hypertrophy. Left ventricular diastolic parameters are consistent with Grade I diastolic dysfunction (impaired relaxation).  2. Right ventricular systolic function is normal. The right ventricular size is normal.  3. The mitral valve is normal in structure. No evidence of mitral valve regurgitation. No evidence of mitral stenosis.  4. The aortic valve is tricuspid. There is mild calcification of the aortic valve. There is mild thickening of the aortic valve. Aortic valve regurgitation is not visualized. No aortic stenosis is present.  5. The inferior vena cava is normal in size with greater than 50% respiratory variability, suggesting right atrial pressure of 3 mmHg. FINDINGS  Left Ventricle: Left ventricular ejection fraction, by estimation, is 40 to 45%. The left ventricle has mildly decreased function. The left ventricle demonstrates global hypokinesis. The left ventricular internal cavity size  was normal in size. There is  mild left ventricular hypertrophy. Left ventricular diastolic parameters are consistent with Grade I diastolic dysfunction (impaired relaxation).  LV Wall Scoring: The  apical inferior segment is akinetic. Right Ventricle: The right ventricular size is normal. No increase in right ventricular wall thickness. Right ventricular systolic function is normal. Left Atrium: Left atrial size was normal in size. Right Atrium: Right atrial size was normal in size. Pericardium: There is no evidence of pericardial effusion. Mitral Valve: The mitral valve is normal in structure. No evidence of mitral valve regurgitation. No evidence of mitral valve stenosis. Tricuspid Valve: The tricuspid valve is normal in structure. Tricuspid valve regurgitation is not demonstrated. No evidence of tricuspid stenosis. Aortic Valve: The aortic valve is tricuspid. There is mild calcification of the aortic valve. There is mild thickening of the aortic valve. Aortic valve regurgitation is not visualized. No aortic stenosis is present. Aortic valve mean gradient measures 3.0 mmHg. Aortic valve peak gradient measures 6.2 mmHg. Aortic valve area, by VTI measures 3.50 cm. Pulmonic Valve: The pulmonic valve was normal in structure. Pulmonic valve regurgitation is not visualized. No evidence of pulmonic stenosis. Aorta: The aortic root is normal in size and structure. Venous: The inferior vena cava is normal in size with greater than 50% respiratory variability, suggesting right atrial pressure of 3 mmHg. IAS/Shunts: No atrial level shunt detected by color flow Doppler.  LEFT VENTRICLE PLAX 2D LVIDd:         5.70 cm     Diastology LVIDs:         4.40 cm     LV e' medial:    5.22 cm/s LV PW:         1.20 cm     LV E/e' medial:  9.8 LV IVS:        1.30 cm     LV e' lateral:   9.68 cm/s LVOT diam:     2.20 cm     LV E/e' lateral: 5.3 LV SV:         74 LV SV Index:   29 LVOT Area:     3.80 cm  LV Volumes (MOD) LV vol d, MOD A2C: 62.0 ml LV vol d, MOD A4C: 96.4 ml LV vol s, MOD A2C: 40.0 ml LV vol s, MOD A4C: 55.1 ml LV SV MOD A2C:     22.0 ml LV SV MOD A4C:     96.4 ml LV SV MOD BP:      30.2 ml RIGHT VENTRICLE RV Basal  diam:  3.70 cm RV Mid diam:    2.80 cm RV S prime:     9.36 cm/s TAPSE (M-mode): 1.6 cm LEFT ATRIUM             Index        RIGHT ATRIUM           Index LA diam:        3.20 cm 1.27 cm/m   RA Area:     14.30 cm LA Vol (A2C):   47.1 ml 18.63 ml/m  RA Volume:   32.40 ml  12.82 ml/m LA Vol (A4C):   55.4 ml 21.92 ml/m LA Biplane Vol: 55.6 ml 22.00 ml/m  AORTIC VALVE                    PULMONIC VALVE AV Area (Vmax):    2.63 cm     PV Vmax:       0.91  m/s AV Area (Vmean):   2.39 cm     PV Peak grad:  3.3 mmHg AV Area (VTI):     3.50 cm AV Vmax:           124.00 cm/s AV Vmean:          87.300 cm/s AV VTI:            0.211 m AV Peak Grad:      6.2 mmHg AV Mean Grad:      3.0 mmHg LVOT Vmax:         85.70 cm/s LVOT Vmean:        54.800 cm/s LVOT VTI:          0.194 m LVOT/AV VTI ratio: 0.92  AORTA Ao Root diam: 3.90 cm Ao Asc diam:  3.60 cm MITRAL VALVE MV Area (PHT): 3.17 cm    SHUNTS MV Decel Time: 239 msec    Systemic VTI:  0.19 m MR Peak grad: 11.7 mmHg    Systemic Diam: 2.20 cm MR Vmax:      171.00 cm/s MV E velocity: 51.10 cm/s MV A velocity: 59.60 cm/s MV E/A ratio:  0.86 Donato Schultz MD Electronically signed by Donato Schultz MD Signature Date/Time: 06/07/2022/4:09:50 PM    Final     Cardiac Studies     Prox LAD lesion is 70% stenosed.   Dist LAD lesion is 40% stenosed.   Dist Cx lesion is 100% stenosed.   Ost Cx lesion is 80% stenosed.   Dist RCA lesion is 40% stenosed.   1st Diag-2 lesion is 60% stenosed.   1st Mrg lesion is 50% stenosed.   2nd Mrg lesion is 99% stenosed.   RPDA lesion is 99% stenosed.   Mid RCA to Dist RCA lesion is 60% stenosed.   1st Diag-1 lesion is 100% stenosed.   Mid Graft lesion is 100% stenosed.   SVG graft was visualized by angiography.   SVG graft was visualized by angiography.   Three vessel CAD s/p 4V CABG with 2/4 patent grafts The mid LAD has a moderate stenosis. The LIMA graft to the mid LAD is atretic. Patent vein graft to the Diagonal branch The  Circumflex has a severe ostial stenosis. Patent vein graft to OM.  The large dominant RCA has a moderately severe mid stenosis. The PDA is occluded. Collaterals to the distal Circumflex through the PDA. The RCA is unchanged from prior cath. The vein graft to the PDA is now occluded.    Recommendations: He is pain free. The LAD has good flow. Patent grafts to the OM and Diagonal. PCI of the mid RCA could be considered if he has recurrent angina. Second STEMI activation prior to this case so I did not plan PCI of this chronic lesion. Will admit to the ICU tonight. Will load with Plavix now and continue ASA/Plavix. High intensity statin and beta blocker. Echo later today.     Assessment & Plan    Principal Problem:   Acute ST elevation myocardial infarction (STEMI) of inferior wall (HCC) Active Problems:   Coronary artery disease   Hypertension   Hyperlipidemia   Diabetes (HCC)   ST elevation myocardial infarction (STEMI) of inferior wall (HCC)  Acute inferior STEMI HTN HLD DM2 Renal injury  acute/chonic ?   grade 3      Recheck BMET prior to discharge although  Continue medical therapy for CAD and STEMI and DAPT for at least 1 yr TK suggested and UpToDate affirms  that Brilinta is preferred to clopidogrel Decreasing amlodipine  Add spiro 12.5 for diuresis and depressed LV function  Will set up followup for [place] if he chooses to pursue Will need BMET in two weeks DD-Sherlock holmes class 3-- identified Cr 1.1 on I stat and chonic CR about 1.5-1.7  so current Cr not so notably different      For questions or updates, please contact [facility] Please consult [email or web address] for contact info under        Signed, Sherryl Manges, MD  06/09/2022, 9:23 AM

## 2022-06-09 NOTE — Progress Notes (Signed)
Nsg Discharge Note  Admit Date:  06/06/2022 Discharge date: 06/09/2022   Ronni Rumble to be D/C'd Home per MD order.  AVS completed. Patient/caregiver able to verbalize understanding.  Discharge Medication: Allergies as of 06/09/2022   No Known Allergies      Medication List     STOP taking these medications    furosemide 20 MG tablet Commonly known as: LASIX   potassium chloride SA 20 MEQ tablet Commonly known as: KLOR-CON M       TAKE these medications    amLODipine 5 MG tablet Commonly known as: NORVASC Take 1 tablet (5 mg total) by mouth daily. Start taking on: June 10, 2022   aspirin EC 81 MG tablet Take 81 mg by mouth daily. Swallow whole.   atorvastatin 80 MG tablet Commonly known as: LIPITOR Take 1 tablet (80 mg total) by mouth daily.   dapagliflozin propanediol 10 MG Tabs tablet Commonly known as: Farxiga Take 1 tablet (10 mg total) by mouth daily. What changed:  medication strength how much to take   ezetimibe 10 MG tablet Commonly known as: ZETIA Take 1 tablet (10 mg total) by mouth daily. Start taking on: June 10, 2022   metoprolol tartrate 25 MG tablet Commonly known as: LOPRESSOR Take 1 tablet (25 mg total) by mouth 2 (two) times daily. What changed: Another medication with the same name was removed. Continue taking this medication, and follow the directions you see here.   nitroGLYCERIN 0.4 MG SL tablet Commonly known as: Nitrostat Place 1 tablet (0.4 mg total) under the tongue every 5 (five) minutes as needed for chest pain (up to 3 doses. If taking 3rd dose call 911).   ticagrelor 90 MG Tabs tablet Commonly known as: BRILINTA Take 1 tablet (90 mg total) by mouth 2 (two) times daily.   Tresiba 100 UNIT/ML Soln Generic drug: Insulin Degludec Inject 28 Units into the skin daily.               Discharge Care Instructions  (From admission, onward)           Start     Ordered   06/09/22 0000  Discharge wound  care:       Comments: Please keep any wounds clean and dry, and schedule follow up with your primary care provider. If you notice increased pain, swelling, bleeding or pus, please seek care.   06/09/22 1107            Discharge Assessment: Vitals:   06/08/22 2059 06/09/22 0501  BP: 134/79 139/86  Pulse: (!) 110 77  Resp: 18 18  Temp: 98.3 F (36.8 C) 98.2 F (36.8 C)  SpO2: 97% 98%   Skin clean, dry and intact without evidence of skin break down, no evidence of skin tears noted. IV catheter discontinued intact. Site without signs and symptoms of complications - no redness or edema noted at insertion site, patient denies c/o pain - only slight tenderness at site.  Dressing with slight pressure applied.  D/c Instructions-Education: Discharge instructions given to patient/family with verbalized understanding. D/c education completed with patient/family including follow up instructions, medication list, d/c activities limitations if indicated, with other d/c instructions as indicated by MD - patient able to verbalize understanding, all questions fully answered. Patient instructed to return to ED, call 911, or call MD for any changes in condition.  Patient escorted via [place], and D/C home via private auto.  Atilano Ina, RN 06/09/2022 12:31 PM

## 2022-06-10 NOTE — Telephone Encounter (Signed)
**Note De-Identified Armanda Forand Obfuscation** Patient contacted regarding discharge from [facility] on 06/09/2022.  Patient understands to follow up with provider Melina Copa, PA-c on 06/17/2022 at 3:35 at [address], [place] in [place], [place] [address]. Patient understands discharge instructions? Yes Patient understands medications and regiment? Yes Patient understands to bring all medications to this visit? Yes  Ask patient:  Are you enrolled in My Chart: No, he states he is not interested.  The pt states that he is currently "doing fine" and he denies having any CP/discomfort, SOB, nausea, vomiting, diaphoresis, dizziness, or lightheadedness.  He verified that he does have [place] phone number and that he will call us if he has any questions or concerns. He thanked me for my call.

## 2022-06-12 ENCOUNTER — Telehealth (HOSPITAL_COMMUNITY): Payer: Self-pay

## 2022-06-12 NOTE — Telephone Encounter (Signed)
Per phase I cardiac rehab, fax referral to [place]. 

## 2022-06-14 NOTE — Progress Notes (Unsigned)
Cardiology Office Note    Date:  06/17/2022   ID:  Larry May, DOB 1946-03-11, MRN [id number]  PCP:  Street, [name], MD  Cardiologist:  Norman Herrlich, MD; patient will be seeing Dr. Judithe Modest in follow-up at his preference Electrophysiologist:  None   Chief Complaint: f/u MI  History of Present Illness:   Larry May is a 76 y.o. male with history of CAD s/p 3V CABG in 2021, recent inferior STEMI 05/2022 (med rx per cath), CKD stage 3b, HTN, HLD, T2DM, chronic appearing thrombocytopenia who is seen for post-hospital follow-up. Recent admission 10/26-10/29/23 for inferior MI reviewed. Cath showed findings below with 2/4 patent grafts and native disease as outlined -> medical therapy recommended, loaded with Plavix, to consider PCI of mid RCA if he had recurrent angina. Echo showed echo 06/07/22 EF 40-45%, global HK, G1DD, normal RV. The following med changes were made - amlodipine was added for anti-anginal effect, metoprolol was decreased to 25mg  BID as otherwise limited by bradycardia, dapagliflozin increased to 10mg  daily, and Zetia added for goal LDL <55.  He returns for follow-up today feeling well. He did note some shortness of breath in the first few days out of the hospital but had spoken with someone who informed him this may be because of Brilinta. It has now resolved. He does report that his pharmacy would not fill the 25mg  dose of metoprolol because of the recent 37.5mg  fill, so he's been taking his prior 37.5mg  BID dose without any adverse effects. No recurrent chest pain, back pain. No dizziness or syncope. He does report chronic  He's pleased with how he is feeling. He has an appointment to establish with Dr. later this month as he declined to follow-up with Dr. or our office going forward.   Labwork independently reviewed: 05/2022 LFTs wnl, K 4.1, Cr 1.64, Hgb 16.1, plt 147, A1c 7, LPa 51.9, trig 119, LDL 76   Cardiology Studies:   Studies reviewed are  outlined and summarized above. Reports included below if pertinent.    Cardiac Cath 06/07/22   Prox LAD lesion is 70% stenosed.   Dist LAD lesion is 40% stenosed.   Dist Cx lesion is 100% stenosed.   Ost Cx lesion is 80% stenosed.   Dist RCA lesion is 40% stenosed.   1st Diag-2 lesion is 60% stenosed.   1st Mrg lesion is 50% stenosed.   2nd Mrg lesion is 99% stenosed.   RPDA lesion is 99% stenosed.   Mid RCA to Dist RCA lesion is 60% stenosed.   1st Diag-1 lesion is 100% stenosed.   Mid Graft lesion is 100% stenosed.   SVG graft was visualized by angiography.   SVG graft was visualized by angiography.   Three vessel CAD s/p 4V CABG with 2/4 patent grafts The mid LAD has a moderate stenosis. The LIMA graft to the mid LAD is atretic. Patent vein graft to the Diagonal branch The Circumflex has a severe ostial stenosis. Patent vein graft to OM.  The large dominant RCA has a moderately severe mid stenosis. The PDA is occluded. Collaterals to the distal Circumflex through the PDA. The RCA is unchanged from prior cath. The vein graft to the PDA is now occluded.    Recommendations: He is pain free. The LAD has good flow. Patent grafts to the OM and Diagonal. PCI of the mid RCA could be considered if he has recurrent angina. Second STEMI activation prior to this case so I did  not plan PCI of this chronic lesion. Will admit to the ICU tonight. Will load with Plavix now and continue ASA/Plavix. High intensity statin and beta blocker. Echo later today.      2D Echo 06/07/22  1. Left ventricular ejection fraction, by estimation, is 40 to 45%. The  left ventricle has mildly decreased function. The left ventricle  demonstrates global hypokinesis. There is mild left ventricular  hypertrophy. Left ventricular diastolic parameters  are consistent with Grade I diastolic dysfunction (impaired relaxation).   2. Right ventricular systolic function is normal. The right ventricular  size is normal.   3.  The mitral valve is normal in structure. No evidence of mitral valve  regurgitation. No evidence of mitral stenosis.   4. The aortic valve is tricuspid. There is mild calcification of the  aortic valve. There is mild thickening of the aortic valve. Aortic valve  regurgitation is not visualized. No aortic stenosis is present.   5. The inferior vena cava is normal in size with greater than 50%  respiratory variability, suggesting right atrial pressure of 3 mmHg.     Past Medical History:  Diagnosis Date   Chronic HFrEF (heart failure with reduced ejection fraction) (HCC)    Chronic kidney disease, stage 3b (HCC)    Coronary artery disease 06/19/2020   Diabetes (HCC)    Hyperlipidemia    Hypertension    Ischemic cardiomyopathy    Non-ST elevation (NSTEMI) myocardial infarction (HCC)    S/P CABG x 4 06/19/2020   LIMA to LAD SVG to DIAGONAL 1 SVG to OM2 SVG to PDA    Past Surgical History:  Procedure Laterality Date   CORONARY ARTERY BYPASS GRAFT N/A 06/19/2020   Procedure: CORONARY ARTERY BYPASS GRAFTING (CABG) times four , using left internal mammary artery and right leg greater saphenous vein harvested endoscopically;  Surgeon: Corliss Skains, MD;  Location: [facility] OR;  Service: Open Heart Surgery;  Laterality: N/A;   CORONARY/GRAFT ACUTE MI REVASCULARIZATION N/A 06/06/2022   Procedure: Coronary/Graft Acute MI Revascularization;  Surgeon: Kathleene Hazel, MD;  Location: [facility] INVASIVE CV LAB;  Service: Cardiovascular;  Laterality: N/A;   HERNIA REPAIR     x 3   LEFT HEART CATH AND CORONARY ANGIOGRAPHY N/A 06/14/2020   Procedure: LEFT HEART CATH AND CORONARY ANGIOGRAPHY;  Surgeon: Kathleene Hazel, MD;  Location: [facility] INVASIVE CV LAB;  Service: Cardiovascular;  Laterality: N/A;   LEFT HEART CATH AND CORONARY ANGIOGRAPHY N/A 06/06/2022   Procedure: LEFT HEART CATH AND CORONARY ANGIOGRAPHY;  Surgeon: Kathleene Hazel, MD;  Location: [facility] INVASIVE CV LAB;  Service:  Cardiovascular;  Laterality: N/A;   PROSTATE SURGERY     TEE WITHOUT CARDIOVERSION N/A 06/19/2020   Procedure: TRANSESOPHAGEAL ECHOCARDIOGRAM (TEE);  Surgeon: Corliss Skains, MD;  Location: [facility] OR;  Service: Open Heart Surgery;  Laterality: N/A;    Current Medications: Current Meds  Medication Sig   amLODipine (NORVASC) 5 MG tablet Take 1 tablet (5 mg total) by mouth daily.   aspirin 81 MG EC tablet Take 81 mg by mouth daily. Swallow whole.   atorvastatin (LIPITOR) 80 MG tablet Take 1 tablet (80 mg total) by mouth daily.   dapagliflozin propanediol (FARXIGA) 10 MG TABS tablet Take 1 tablet (10 mg total) by mouth daily.   ezetimibe (ZETIA) 10 MG tablet Take 1 tablet (10 mg total) by mouth daily.   Insulin Degludec (TRESIBA) 100 UNIT/ML SOLN Inject 28 Units into the skin daily.   nitroGLYCERIN (NITROSTAT) 0.4 MG  SL tablet Place 1 tablet (0.4 mg total) under the tongue every 5 (five) minutes as needed for chest pain (up to 3 doses. If taking 3rd dose call 911).   ticagrelor (BRILINTA) 90 MG TABS tablet Take 1 tablet (90 mg total) by mouth 2 (two) times daily.   [DISCONTINUED] metoprolol tartrate (LOPRESSOR) 25 MG tablet Take 1 tablet (25 mg total) by mouth 2 (two) times daily. (Patient taking differently: Take 37.5 mg by mouth 2 (two) times daily.)      Allergies:   Patient has no known allergies.   Social History   Socioeconomic History   Marital status: Divorced    Spouse name: Not on file   Number of children: Not on file   Years of education: Not on file   Highest education level: Not on file  Occupational History   Not on file  Tobacco Use   Smoking status: Light Smoker    Types: Pipe   Smokeless tobacco: Never  Substance and Sexual Activity   Alcohol use: Not on file   Drug use: Not on file   Sexual activity: Not on file  Other Topics Concern   Not on file  Social History Narrative   Not on file   Social Determinants of Health   Financial Resource Strain: Not on  file  Food Insecurity: Not on file  Transportation Needs: Not on file  Physical Activity: Not on file  Stress: Not on file  Social Connections: Not on file     Family History:  The patient's family history includes Heart attack in his father.  ROS:   Please see the history of present illness.  All other systems are reviewed and otherwise negative.    EKG(s)/Additional Labs   EKG:  EKG is ordered today, personally reviewed, demonstrating NSR 64bpm, first degree AVB, persistent subtle ST elevation inferior leads with TWI I, avL, V3-V6 - prior lateral and inferior infarct - suspect evolution of recent MI though subtle STE pattern also seen in 2021 as well. QTc 462ms, improving (previously 534ms)  Recent Labs: 06/08/2022: Hemoglobin 16.1; Platelets 147 06/09/2022: ALT 17; BUN 28; Creatinine, Ser 1.64; Potassium 4.1; Sodium 140  Recent Lipid Panel    Component Value Date/Time   CHOL 137 06/08/2022 0729   TRIG 119 06/08/2022 0729   HDL 37 (L) 06/08/2022 0729   CHOLHDL 3.7 06/08/2022 0729   VLDL 24 06/08/2022 0729   LDLCALC 76 06/08/2022 0729    PHYSICAL EXAM:    VS:  BP 116/76   Pulse 64   Ht 6\' 2"  (1.88 m)   Wt 290 lb 12.8 oz (131.9 kg)   SpO2 97%   BMI 37.34 kg/m   BMI: Body mass index is 37.34 kg/m.  GEN: Well nourished, well developed male in no acute distress HEENT: normocephalic, atraumatic Neck: no JVD, carotid bruits, or masses Cardiac: RRR; no murmurs, rubs, or gallops, mild LE edema with chronic venous stasis changes Respiratory:  clear to auscultation bilaterally, normal work of breathing GI: soft, nontender, nondistended, + BS MS: no deformity or atrophy Skin: warm and dry, no rash, left radial cath site without hematoma or ecchymosis; good pulse. Neuro:  Alert and Oriented x 3, Strength and sensation are intact, follows commands Psych: euthymic mood, full affect  Wt Readings from Last 3 Encounters:  06/17/22 290 lb 12.8 oz (131.9 kg)  06/09/22 284 lb  12.8 oz (129.2 kg)  08/31/20 275 lb 3.2 oz (124.8 kg)     ASSESSMENT & PLAN:  1. CAD, recent MI, HLD - clinically doing well. Had mild dyspnea after discharge that resolved. His ischemic equivalent was back pain/shoulder blade pain which has not recurred. Continue ASA, Brilinta, amlodipine (added for anti-anginal), atorvastatin, ezetimibe, metoprolol. He went back to his 37.5mg  BID dose of metoprolol and has done well on this regimen so we will update his rx back to this dose. EKG remains abnormal but looks like evolution of recent MI. He is feeling great today. Referred to CRP2 during hospital stay. I encouraged him to talk to his new cardiologist about when to recheck lipids.  2. Chronic HFrEF, essential HTN - weight is up from hospital stay but he has large, heavy boots on today. He does have mild lower extremity edema on exam though patient states this is stable from his norm. Lungs are clear and he feels great today. Will await labwork to help guide addition of ARB/spironolactone to regimen. Dr. Graciela Husbands previously suggested that it would be worthwhile to add spironolactone and reduce amlodipine, but we will see what labs show.  4. CKD stage 3b - recheck labs today.  4. Prolonged QT interval - improving from recent hospitalization. No offending agents identified. Check BMET, Mg, TSH.    Disposition: Patient has already elected to establish with Dr Judithe Modest (different practice); he will otherwise follow-up with [facility] - I encouraged him to reach out to our office if he has any cardiac needs during his time of transition.   Medication Adjustments/Labs and Tests Ordered: Current medicines are reviewed at length with the patient today.  Concerns regarding medicines are outlined above. Medication changes, Labs and Tests ordered today are summarized above and listed in the Patient Instructions accessible in Encounters. Did not charge TOC as patient will be switching practices during the initial 30  day post hospital period, somewhat of a unique situation.  Signed, Laurann Montana, PA-C  06/17/2022 4:13 PM    [facility] Phone: [phone]; Fax: [phone]

## 2022-06-17 ENCOUNTER — Ambulatory Visit: Payer: Medicare HMO | Attending: Physician Assistant | Admitting: Physician Assistant

## 2022-06-17 ENCOUNTER — Encounter: Payer: Self-pay | Admitting: Physician Assistant

## 2022-06-17 VITALS — BP 116/76 | HR 64 | Ht 74.0 in | Wt 290.8 lb

## 2022-06-17 DIAGNOSIS — I2510 Atherosclerotic heart disease of native coronary artery without angina pectoris: Secondary | ICD-10-CM

## 2022-06-17 DIAGNOSIS — E785 Hyperlipidemia, unspecified: Secondary | ICD-10-CM | POA: Diagnosis not present

## 2022-06-17 DIAGNOSIS — N1832 Chronic kidney disease, stage 3b: Secondary | ICD-10-CM

## 2022-06-17 DIAGNOSIS — I10 Essential (primary) hypertension: Secondary | ICD-10-CM

## 2022-06-17 DIAGNOSIS — R9431 Abnormal electrocardiogram [ECG] [EKG]: Secondary | ICD-10-CM | POA: Diagnosis not present

## 2022-06-17 DIAGNOSIS — I4581 Long QT syndrome: Secondary | ICD-10-CM | POA: Diagnosis not present

## 2022-06-17 MED ORDER — METOPROLOL TARTRATE 37.5 MG PO TABS: 37.5000 mg | ORAL_TABLET | Freq: Two times a day (BID) | ORAL | 1 refills | Status: AC

## 2022-06-17 MED ORDER — METOPROLOL TARTRATE 37.5 MG PO TABS: 37.5000 mg | ORAL_TABLET | Freq: Two times a day (BID) | ORAL | 1 refills | Status: DC

## 2022-06-17 NOTE — Patient Instructions (Addendum)
  Medication Instructions:   Let's have you continue the metoprolol at the dose you are currently taking (37.5mg  twice daily) instead of the lower dose written on your hospital instructions. We sent in a follow-up prescription to your pharmacy to make them aware that we'll plan to keep you on this dose.  Please speak with your new cardiologist about when to recheck your cholesterol since you were started on ezetimibe in the hospital recently.  Your physician recommends that you continue on the rest your current medications as directed. Please refer to the Current Medication list given to you today.   *If you need a refill on your cardiac medications before your next appointment, please call your pharmacy*   Lab Work:  BMET MAG AND TSH TODAY   If you have labs (blood work) drawn today and your tests are completely normal, you will receive your results only by: [place] (if you have MyChart) OR A paper copy in the mail If you have any lab test that is abnormal or we need to change your treatment, we will call you to review the results.   Testing/Procedures: NONE ORDERED  TODAY     Follow-Up: At [facility], you and your health needs are our priority.  As part of our continuing mission to provide you with exceptional heart care, we have created designated Provider Care Teams.  These Care Teams include your primary Cardiologist (physician) and Advanced Practice Providers (APPs -  Physician Assistants and Nurse Practitioners) who all work together to provide you with the care you need, when you need it.  We recommend signing up for the patient portal called "MyChart".  Sign up information is provided on this After Visit Summary.  MyChart is used to connect with patients for Virtual Visits (Telemedicine).  Patients are able to view lab/test results, encounter notes, upcoming appointments, etc.  Non-urgent messages can be sent to your provider as well.   To learn more about  what you can do with MyChart, go to [email or web address].     Your next appointment CONTACT [facility] [phone] AS NEEDED FOR  ANY CARDIAC RELATED SYMPTOMS   Important Information About Sugar

## 2022-06-18 ENCOUNTER — Telehealth: Payer: Self-pay | Admitting: *Deleted

## 2022-06-18 LAB — MAGNESIUM: Magnesium: 2.2 mg/dL (ref 1.6–2.3)

## 2022-06-18 LAB — BASIC METABOLIC PANEL
BUN/Creatinine Ratio: 17 (ref 10–24)
BUN: 23 mg/dL (ref 8–27)
CO2: 21 mmol/L (ref 20–29)
Calcium: 9.4 mg/dL (ref 8.6–10.2)
Chloride: 107 mmol/L — ABNORMAL HIGH (ref 96–106)
Creatinine, Ser: 1.34 mg/dL — ABNORMAL HIGH (ref 0.76–1.27)
Glucose: 191 mg/dL — ABNORMAL HIGH (ref 70–99)
Potassium: 4.8 mmol/L (ref 3.5–5.2)
Sodium: 146 mmol/L — ABNORMAL HIGH (ref 134–144)
eGFR: 54 mL/min/{1.73_m2} — ABNORMAL LOW (ref >59)

## 2022-06-18 LAB — TSH: TSH: 1.43 u[IU]/mL (ref 0.450–4.500)

## 2022-06-18 NOTE — Telephone Encounter (Signed)
Lvm to call back direct number [phone] for results.

## 2022-06-19 DIAGNOSIS — Z951 Presence of aortocoronary bypass graft: Secondary | ICD-10-CM | POA: Diagnosis not present

## 2022-06-19 DIAGNOSIS — Z955 Presence of coronary angioplasty implant and graft: Secondary | ICD-10-CM | POA: Diagnosis not present

## 2022-06-19 DIAGNOSIS — I252 Old myocardial infarction: Secondary | ICD-10-CM | POA: Diagnosis not present

## 2022-06-19 DIAGNOSIS — Z79899 Other long term (current) drug therapy: Secondary | ICD-10-CM | POA: Diagnosis not present

## 2022-06-19 DIAGNOSIS — Z7982 Long term (current) use of aspirin: Secondary | ICD-10-CM | POA: Diagnosis not present

## 2022-06-19 DIAGNOSIS — I25118 Atherosclerotic heart disease of native coronary artery with other forms of angina pectoris: Secondary | ICD-10-CM | POA: Diagnosis not present

## 2022-06-19 DIAGNOSIS — E119 Type 2 diabetes mellitus without complications: Secondary | ICD-10-CM | POA: Diagnosis not present

## 2022-06-24 DIAGNOSIS — Z951 Presence of aortocoronary bypass graft: Secondary | ICD-10-CM | POA: Diagnosis not present

## 2022-06-24 DIAGNOSIS — Z7982 Long term (current) use of aspirin: Secondary | ICD-10-CM | POA: Diagnosis not present

## 2022-06-24 DIAGNOSIS — I25118 Atherosclerotic heart disease of native coronary artery with other forms of angina pectoris: Secondary | ICD-10-CM | POA: Diagnosis not present

## 2022-06-24 DIAGNOSIS — Z79899 Other long term (current) drug therapy: Secondary | ICD-10-CM | POA: Diagnosis not present

## 2022-06-24 DIAGNOSIS — E119 Type 2 diabetes mellitus without complications: Secondary | ICD-10-CM | POA: Diagnosis not present

## 2022-06-24 DIAGNOSIS — I252 Old myocardial infarction: Secondary | ICD-10-CM | POA: Diagnosis not present

## 2022-06-24 DIAGNOSIS — Z955 Presence of coronary angioplasty implant and graft: Secondary | ICD-10-CM | POA: Diagnosis not present

## 2022-06-26 DIAGNOSIS — Z7982 Long term (current) use of aspirin: Secondary | ICD-10-CM | POA: Diagnosis not present

## 2022-06-26 DIAGNOSIS — Z955 Presence of coronary angioplasty implant and graft: Secondary | ICD-10-CM | POA: Diagnosis not present

## 2022-06-26 DIAGNOSIS — E119 Type 2 diabetes mellitus without complications: Secondary | ICD-10-CM | POA: Diagnosis not present

## 2022-06-26 DIAGNOSIS — Z951 Presence of aortocoronary bypass graft: Secondary | ICD-10-CM | POA: Diagnosis not present

## 2022-06-26 DIAGNOSIS — I25118 Atherosclerotic heart disease of native coronary artery with other forms of angina pectoris: Secondary | ICD-10-CM | POA: Diagnosis not present

## 2022-06-26 DIAGNOSIS — Z79899 Other long term (current) drug therapy: Secondary | ICD-10-CM | POA: Diagnosis not present

## 2022-06-26 DIAGNOSIS — I252 Old myocardial infarction: Secondary | ICD-10-CM | POA: Diagnosis not present

## 2022-06-28 DIAGNOSIS — I25118 Atherosclerotic heart disease of native coronary artery with other forms of angina pectoris: Secondary | ICD-10-CM | POA: Diagnosis not present

## 2022-06-28 DIAGNOSIS — Z7982 Long term (current) use of aspirin: Secondary | ICD-10-CM | POA: Diagnosis not present

## 2022-06-28 DIAGNOSIS — Z955 Presence of coronary angioplasty implant and graft: Secondary | ICD-10-CM | POA: Diagnosis not present

## 2022-06-28 DIAGNOSIS — Z951 Presence of aortocoronary bypass graft: Secondary | ICD-10-CM | POA: Diagnosis not present

## 2022-06-28 DIAGNOSIS — I252 Old myocardial infarction: Secondary | ICD-10-CM | POA: Diagnosis not present

## 2022-06-28 DIAGNOSIS — Z79899 Other long term (current) drug therapy: Secondary | ICD-10-CM | POA: Diagnosis not present

## 2022-06-28 DIAGNOSIS — E119 Type 2 diabetes mellitus without complications: Secondary | ICD-10-CM | POA: Diagnosis not present

## 2022-07-01 DIAGNOSIS — E119 Type 2 diabetes mellitus without complications: Secondary | ICD-10-CM | POA: Diagnosis not present

## 2022-07-01 DIAGNOSIS — E785 Hyperlipidemia, unspecified: Secondary | ICD-10-CM | POA: Diagnosis not present

## 2022-07-01 DIAGNOSIS — N1832 Chronic kidney disease, stage 3b: Secondary | ICD-10-CM | POA: Diagnosis not present

## 2022-07-01 DIAGNOSIS — Z955 Presence of coronary angioplasty implant and graft: Secondary | ICD-10-CM | POA: Diagnosis not present

## 2022-07-01 DIAGNOSIS — Z951 Presence of aortocoronary bypass graft: Secondary | ICD-10-CM | POA: Diagnosis not present

## 2022-07-01 DIAGNOSIS — Z7982 Long term (current) use of aspirin: Secondary | ICD-10-CM | POA: Diagnosis not present

## 2022-07-01 DIAGNOSIS — R0602 Shortness of breath: Secondary | ICD-10-CM | POA: Diagnosis not present

## 2022-07-01 DIAGNOSIS — Z79899 Other long term (current) drug therapy: Secondary | ICD-10-CM | POA: Diagnosis not present

## 2022-07-01 DIAGNOSIS — I129 Hypertensive chronic kidney disease with stage 1 through stage 4 chronic kidney disease, or unspecified chronic kidney disease: Secondary | ICD-10-CM | POA: Diagnosis not present

## 2022-07-01 DIAGNOSIS — I440 Atrioventricular block, first degree: Secondary | ICD-10-CM | POA: Diagnosis not present

## 2022-07-01 DIAGNOSIS — I2510 Atherosclerotic heart disease of native coronary artery without angina pectoris: Secondary | ICD-10-CM | POA: Diagnosis not present

## 2022-07-01 DIAGNOSIS — I25118 Atherosclerotic heart disease of native coronary artery with other forms of angina pectoris: Secondary | ICD-10-CM | POA: Diagnosis not present

## 2022-07-01 DIAGNOSIS — I252 Old myocardial infarction: Secondary | ICD-10-CM | POA: Diagnosis not present

## 2022-07-02 DIAGNOSIS — Z7982 Long term (current) use of aspirin: Secondary | ICD-10-CM | POA: Diagnosis not present

## 2022-07-02 DIAGNOSIS — Z79899 Other long term (current) drug therapy: Secondary | ICD-10-CM | POA: Diagnosis not present

## 2022-07-02 DIAGNOSIS — I252 Old myocardial infarction: Secondary | ICD-10-CM | POA: Diagnosis not present

## 2022-07-02 DIAGNOSIS — Z951 Presence of aortocoronary bypass graft: Secondary | ICD-10-CM | POA: Diagnosis not present

## 2022-07-02 DIAGNOSIS — I25118 Atherosclerotic heart disease of native coronary artery with other forms of angina pectoris: Secondary | ICD-10-CM | POA: Diagnosis not present

## 2022-07-02 DIAGNOSIS — Z955 Presence of coronary angioplasty implant and graft: Secondary | ICD-10-CM | POA: Diagnosis not present

## 2022-07-02 DIAGNOSIS — E119 Type 2 diabetes mellitus without complications: Secondary | ICD-10-CM | POA: Diagnosis not present

## 2022-07-03 DIAGNOSIS — Z951 Presence of aortocoronary bypass graft: Secondary | ICD-10-CM | POA: Diagnosis not present

## 2022-07-03 DIAGNOSIS — Z79899 Other long term (current) drug therapy: Secondary | ICD-10-CM | POA: Diagnosis not present

## 2022-07-03 DIAGNOSIS — Z955 Presence of coronary angioplasty implant and graft: Secondary | ICD-10-CM | POA: Diagnosis not present

## 2022-07-03 DIAGNOSIS — I25118 Atherosclerotic heart disease of native coronary artery with other forms of angina pectoris: Secondary | ICD-10-CM | POA: Diagnosis not present

## 2022-07-03 DIAGNOSIS — Z7982 Long term (current) use of aspirin: Secondary | ICD-10-CM | POA: Diagnosis not present

## 2022-07-03 DIAGNOSIS — E119 Type 2 diabetes mellitus without complications: Secondary | ICD-10-CM | POA: Diagnosis not present

## 2022-07-03 DIAGNOSIS — I252 Old myocardial infarction: Secondary | ICD-10-CM | POA: Diagnosis not present

## 2022-07-08 DIAGNOSIS — I25118 Atherosclerotic heart disease of native coronary artery with other forms of angina pectoris: Secondary | ICD-10-CM | POA: Diagnosis not present

## 2022-07-08 DIAGNOSIS — Z79899 Other long term (current) drug therapy: Secondary | ICD-10-CM | POA: Diagnosis not present

## 2022-07-08 DIAGNOSIS — Z951 Presence of aortocoronary bypass graft: Secondary | ICD-10-CM | POA: Diagnosis not present

## 2022-07-08 DIAGNOSIS — Z7982 Long term (current) use of aspirin: Secondary | ICD-10-CM | POA: Diagnosis not present

## 2022-07-08 DIAGNOSIS — I252 Old myocardial infarction: Secondary | ICD-10-CM | POA: Diagnosis not present

## 2022-07-08 DIAGNOSIS — Z955 Presence of coronary angioplasty implant and graft: Secondary | ICD-10-CM | POA: Diagnosis not present

## 2022-07-08 DIAGNOSIS — E119 Type 2 diabetes mellitus without complications: Secondary | ICD-10-CM | POA: Diagnosis not present

## 2022-07-09 DIAGNOSIS — Z951 Presence of aortocoronary bypass graft: Secondary | ICD-10-CM | POA: Diagnosis not present

## 2022-07-09 DIAGNOSIS — N1832 Chronic kidney disease, stage 3b: Secondary | ICD-10-CM | POA: Diagnosis not present

## 2022-07-09 DIAGNOSIS — I25119 Atherosclerotic heart disease of native coronary artery with unspecified angina pectoris: Secondary | ICD-10-CM | POA: Diagnosis not present

## 2022-07-09 DIAGNOSIS — I252 Old myocardial infarction: Secondary | ICD-10-CM | POA: Diagnosis not present

## 2022-07-09 DIAGNOSIS — G63 Polyneuropathy in diseases classified elsewhere: Secondary | ICD-10-CM | POA: Diagnosis not present

## 2022-07-09 DIAGNOSIS — I5032 Chronic diastolic (congestive) heart failure: Secondary | ICD-10-CM | POA: Diagnosis not present

## 2022-07-09 DIAGNOSIS — R809 Proteinuria, unspecified: Secondary | ICD-10-CM | POA: Diagnosis not present

## 2022-07-09 DIAGNOSIS — S98132A Complete traumatic amputation of one left lesser toe, initial encounter: Secondary | ICD-10-CM | POA: Diagnosis not present

## 2022-07-09 DIAGNOSIS — Z0000 Encounter for general adult medical examination without abnormal findings: Secondary | ICD-10-CM | POA: Diagnosis not present

## 2022-07-09 DIAGNOSIS — E1129 Type 2 diabetes mellitus with other diabetic kidney complication: Secondary | ICD-10-CM | POA: Diagnosis not present

## 2022-07-09 DIAGNOSIS — E785 Hyperlipidemia, unspecified: Secondary | ICD-10-CM | POA: Diagnosis not present

## 2022-07-09 DIAGNOSIS — I10 Essential (primary) hypertension: Secondary | ICD-10-CM | POA: Diagnosis not present

## 2022-07-15 DIAGNOSIS — I252 Old myocardial infarction: Secondary | ICD-10-CM | POA: Diagnosis not present

## 2022-07-15 DIAGNOSIS — Z955 Presence of coronary angioplasty implant and graft: Secondary | ICD-10-CM | POA: Diagnosis not present

## 2022-07-17 DIAGNOSIS — Z955 Presence of coronary angioplasty implant and graft: Secondary | ICD-10-CM | POA: Diagnosis not present

## 2022-07-17 DIAGNOSIS — I252 Old myocardial infarction: Secondary | ICD-10-CM | POA: Diagnosis not present

## 2022-07-19 DIAGNOSIS — Z955 Presence of coronary angioplasty implant and graft: Secondary | ICD-10-CM | POA: Diagnosis not present

## 2022-07-19 DIAGNOSIS — I252 Old myocardial infarction: Secondary | ICD-10-CM | POA: Diagnosis not present

## 2022-07-22 DIAGNOSIS — Z7984 Long term (current) use of oral hypoglycemic drugs: Secondary | ICD-10-CM | POA: Diagnosis not present

## 2022-07-22 DIAGNOSIS — Z89422 Acquired absence of other left toe(s): Secondary | ICD-10-CM | POA: Diagnosis not present

## 2022-07-22 DIAGNOSIS — L603 Nail dystrophy: Secondary | ICD-10-CM | POA: Diagnosis not present

## 2022-07-22 DIAGNOSIS — E119 Type 2 diabetes mellitus without complications: Secondary | ICD-10-CM | POA: Diagnosis not present

## 2022-07-22 DIAGNOSIS — L602 Onychogryphosis: Secondary | ICD-10-CM | POA: Diagnosis not present

## 2022-07-24 DIAGNOSIS — Z955 Presence of coronary angioplasty implant and graft: Secondary | ICD-10-CM | POA: Diagnosis not present

## 2022-07-24 DIAGNOSIS — I252 Old myocardial infarction: Secondary | ICD-10-CM | POA: Diagnosis not present

## 2022-07-26 DIAGNOSIS — I252 Old myocardial infarction: Secondary | ICD-10-CM | POA: Diagnosis not present

## 2022-07-26 DIAGNOSIS — Z955 Presence of coronary angioplasty implant and graft: Secondary | ICD-10-CM | POA: Diagnosis not present

## 2022-07-29 DIAGNOSIS — Z955 Presence of coronary angioplasty implant and graft: Secondary | ICD-10-CM | POA: Diagnosis not present

## 2022-07-29 DIAGNOSIS — I252 Old myocardial infarction: Secondary | ICD-10-CM | POA: Diagnosis not present

## 2022-07-31 DIAGNOSIS — Z955 Presence of coronary angioplasty implant and graft: Secondary | ICD-10-CM | POA: Diagnosis not present

## 2022-07-31 DIAGNOSIS — I252 Old myocardial infarction: Secondary | ICD-10-CM | POA: Diagnosis not present

## 2022-08-02 DIAGNOSIS — I252 Old myocardial infarction: Secondary | ICD-10-CM | POA: Diagnosis not present

## 2022-08-02 DIAGNOSIS — Z955 Presence of coronary angioplasty implant and graft: Secondary | ICD-10-CM | POA: Diagnosis not present

## 2022-08-06 DIAGNOSIS — Z955 Presence of coronary angioplasty implant and graft: Secondary | ICD-10-CM | POA: Diagnosis not present

## 2022-08-06 DIAGNOSIS — I252 Old myocardial infarction: Secondary | ICD-10-CM | POA: Diagnosis not present

## 2022-08-07 DIAGNOSIS — Z955 Presence of coronary angioplasty implant and graft: Secondary | ICD-10-CM | POA: Diagnosis not present

## 2022-08-07 DIAGNOSIS — I252 Old myocardial infarction: Secondary | ICD-10-CM | POA: Diagnosis not present

## 2022-08-09 DIAGNOSIS — I252 Old myocardial infarction: Secondary | ICD-10-CM | POA: Diagnosis not present

## 2022-08-09 DIAGNOSIS — Z955 Presence of coronary angioplasty implant and graft: Secondary | ICD-10-CM | POA: Diagnosis not present

## 2022-08-14 DIAGNOSIS — Z955 Presence of coronary angioplasty implant and graft: Secondary | ICD-10-CM | POA: Diagnosis not present

## 2022-08-14 DIAGNOSIS — I252 Old myocardial infarction: Secondary | ICD-10-CM | POA: Diagnosis not present

## 2022-08-16 DIAGNOSIS — I252 Old myocardial infarction: Secondary | ICD-10-CM | POA: Diagnosis not present

## 2022-08-16 DIAGNOSIS — Z955 Presence of coronary angioplasty implant and graft: Secondary | ICD-10-CM | POA: Diagnosis not present

## 2022-08-19 DIAGNOSIS — Z955 Presence of coronary angioplasty implant and graft: Secondary | ICD-10-CM | POA: Diagnosis not present

## 2022-08-19 DIAGNOSIS — I252 Old myocardial infarction: Secondary | ICD-10-CM | POA: Diagnosis not present

## 2022-08-22 DIAGNOSIS — R82998 Other abnormal findings in urine: Secondary | ICD-10-CM | POA: Diagnosis not present

## 2022-08-22 DIAGNOSIS — R3129 Other microscopic hematuria: Secondary | ICD-10-CM | POA: Diagnosis not present

## 2022-08-22 DIAGNOSIS — N200 Calculus of kidney: Secondary | ICD-10-CM | POA: Diagnosis not present

## 2022-08-22 DIAGNOSIS — K5641 Fecal impaction: Secondary | ICD-10-CM | POA: Diagnosis not present

## 2022-08-23 DIAGNOSIS — Z955 Presence of coronary angioplasty implant and graft: Secondary | ICD-10-CM | POA: Diagnosis not present

## 2022-08-23 DIAGNOSIS — I252 Old myocardial infarction: Secondary | ICD-10-CM | POA: Diagnosis not present

## 2022-08-28 DIAGNOSIS — Z955 Presence of coronary angioplasty implant and graft: Secondary | ICD-10-CM | POA: Diagnosis not present

## 2022-08-28 DIAGNOSIS — I252 Old myocardial infarction: Secondary | ICD-10-CM | POA: Diagnosis not present

## 2022-08-30 DIAGNOSIS — I252 Old myocardial infarction: Secondary | ICD-10-CM | POA: Diagnosis not present

## 2022-08-30 DIAGNOSIS — Z955 Presence of coronary angioplasty implant and graft: Secondary | ICD-10-CM | POA: Diagnosis not present

## 2022-09-02 DIAGNOSIS — Z955 Presence of coronary angioplasty implant and graft: Secondary | ICD-10-CM | POA: Diagnosis not present

## 2022-09-02 DIAGNOSIS — I252 Old myocardial infarction: Secondary | ICD-10-CM | POA: Diagnosis not present

## 2022-09-04 DIAGNOSIS — I252 Old myocardial infarction: Secondary | ICD-10-CM | POA: Diagnosis not present

## 2022-09-04 DIAGNOSIS — Z955 Presence of coronary angioplasty implant and graft: Secondary | ICD-10-CM | POA: Diagnosis not present

## 2022-09-06 DIAGNOSIS — I252 Old myocardial infarction: Secondary | ICD-10-CM | POA: Diagnosis not present

## 2022-09-06 DIAGNOSIS — Z955 Presence of coronary angioplasty implant and graft: Secondary | ICD-10-CM | POA: Diagnosis not present

## 2022-09-11 DIAGNOSIS — I252 Old myocardial infarction: Secondary | ICD-10-CM | POA: Diagnosis not present

## 2022-09-11 DIAGNOSIS — Z955 Presence of coronary angioplasty implant and graft: Secondary | ICD-10-CM | POA: Diagnosis not present

## 2022-09-13 DIAGNOSIS — I2119 ST elevation (STEMI) myocardial infarction involving other coronary artery of inferior wall: Secondary | ICD-10-CM | POA: Diagnosis not present

## 2022-09-13 DIAGNOSIS — I252 Old myocardial infarction: Secondary | ICD-10-CM | POA: Diagnosis not present

## 2022-09-18 DIAGNOSIS — I2119 ST elevation (STEMI) myocardial infarction involving other coronary artery of inferior wall: Secondary | ICD-10-CM | POA: Diagnosis not present

## 2022-09-18 DIAGNOSIS — I252 Old myocardial infarction: Secondary | ICD-10-CM | POA: Diagnosis not present

## 2022-09-23 DIAGNOSIS — I2119 ST elevation (STEMI) myocardial infarction involving other coronary artery of inferior wall: Secondary | ICD-10-CM | POA: Diagnosis not present

## 2022-09-23 DIAGNOSIS — I252 Old myocardial infarction: Secondary | ICD-10-CM | POA: Diagnosis not present

## 2022-09-27 DIAGNOSIS — I252 Old myocardial infarction: Secondary | ICD-10-CM | POA: Diagnosis not present

## 2022-09-27 DIAGNOSIS — I2119 ST elevation (STEMI) myocardial infarction involving other coronary artery of inferior wall: Secondary | ICD-10-CM | POA: Diagnosis not present

## 2022-10-02 DIAGNOSIS — I2119 ST elevation (STEMI) myocardial infarction involving other coronary artery of inferior wall: Secondary | ICD-10-CM | POA: Diagnosis not present

## 2022-10-02 DIAGNOSIS — I252 Old myocardial infarction: Secondary | ICD-10-CM | POA: Diagnosis not present

## 2022-10-04 DIAGNOSIS — I2119 ST elevation (STEMI) myocardial infarction involving other coronary artery of inferior wall: Secondary | ICD-10-CM | POA: Diagnosis not present

## 2022-10-04 DIAGNOSIS — I252 Old myocardial infarction: Secondary | ICD-10-CM | POA: Diagnosis not present

## 2022-11-01 DIAGNOSIS — S0181XA Laceration without foreign body of other part of head, initial encounter: Secondary | ICD-10-CM | POA: Diagnosis not present

## 2022-11-02 DIAGNOSIS — L7622 Postprocedural hemorrhage and hematoma of skin and subcutaneous tissue following other procedure: Secondary | ICD-10-CM | POA: Diagnosis not present

## 2022-12-06 ENCOUNTER — Telehealth: Payer: Self-pay

## 2022-12-06 NOTE — Patient Outreach (Signed)
  Care Coordination   12/06/2022 Name: OHM DENTLER MRN: [id number] DOB: March 07, 1946   Care Coordination Outreach Attempts:  An unsuccessful telephone outreach was attempted today to offer the patient information about available care coordination services as a benefit of their health plan.   Follow Up Plan:  Additional outreach attempts will be made to offer the patient care coordination information and services.   Encounter Outcome:  No Answer   Care Coordination Interventions:  No, not indicated    Rowe Pavy, RN, BSN, [facility] [phone]

## 2022-12-11 ENCOUNTER — Telehealth: Payer: Self-pay

## 2022-12-11 NOTE — Patient Outreach (Signed)
  Care Coordination   12/11/2022 Name: HUSSAM MUNIZ MRN: [id number] DOB: 06-Nov-1945   Care Coordination Outreach Attempts:  A second unsuccessful outreach was attempted today to offer the patient with information about available care coordination services.  Follow Up Plan:  Additional outreach attempts will be made to offer the patient care coordination information and services.   Encounter Outcome:  No Answer   Care Coordination Interventions:  No, not indicated    Rowe Pavy, RN, BSN, [facility] [employer] [facility] [phone]

## 2022-12-12 ENCOUNTER — Telehealth: Payer: Self-pay

## 2022-12-12 NOTE — Patient Outreach (Signed)
  Care Coordination   12/12/2022 Name: Larry May MRN: [id number] DOB: [birth year]   Care Coordination Outreach Attempts:  A third unsuccessful outreach was attempted today to offer the patient with information about available care coordination services.  Follow Up Plan:  No further outreach attempts will be made at this time. We have been unable to contact the patient to offer or enroll patient in care coordination services  Encounter Outcome:  No Answer   Care Coordination Interventions:  No, not indicated    Rowe Pavy, RN, BSN, CEN [facility] Coordinator [phone]

## 2023-01-29 DIAGNOSIS — I129 Hypertensive chronic kidney disease with stage 1 through stage 4 chronic kidney disease, or unspecified chronic kidney disease: Secondary | ICD-10-CM | POA: Diagnosis not present

## 2023-01-29 DIAGNOSIS — N1832 Chronic kidney disease, stage 3b: Secondary | ICD-10-CM | POA: Diagnosis not present

## 2023-01-29 DIAGNOSIS — E785 Hyperlipidemia, unspecified: Secondary | ICD-10-CM | POA: Diagnosis not present

## 2023-01-29 DIAGNOSIS — I252 Old myocardial infarction: Secondary | ICD-10-CM | POA: Diagnosis not present

## 2023-01-29 DIAGNOSIS — Z79899 Other long term (current) drug therapy: Secondary | ICD-10-CM | POA: Diagnosis not present

## 2023-01-29 DIAGNOSIS — I2510 Atherosclerotic heart disease of native coronary artery without angina pectoris: Secondary | ICD-10-CM | POA: Diagnosis not present

## 2023-01-29 DIAGNOSIS — Z951 Presence of aortocoronary bypass graft: Secondary | ICD-10-CM | POA: Diagnosis not present

## 2023-02-07 DIAGNOSIS — I5032 Chronic diastolic (congestive) heart failure: Secondary | ICD-10-CM | POA: Diagnosis not present

## 2023-02-07 DIAGNOSIS — M62830 Muscle spasm of back: Secondary | ICD-10-CM | POA: Diagnosis not present

## 2023-02-07 DIAGNOSIS — N1832 Chronic kidney disease, stage 3b: Secondary | ICD-10-CM | POA: Diagnosis not present

## 2023-02-07 DIAGNOSIS — R809 Proteinuria, unspecified: Secondary | ICD-10-CM | POA: Diagnosis not present

## 2023-02-07 DIAGNOSIS — S39012S Strain of muscle, fascia and tendon of lower back, sequela: Secondary | ICD-10-CM | POA: Diagnosis not present

## 2023-02-07 DIAGNOSIS — E785 Hyperlipidemia, unspecified: Secondary | ICD-10-CM | POA: Diagnosis not present

## 2023-02-07 DIAGNOSIS — M1991 Primary osteoarthritis, unspecified site: Secondary | ICD-10-CM | POA: Diagnosis not present

## 2023-02-07 DIAGNOSIS — I25119 Atherosclerotic heart disease of native coronary artery with unspecified angina pectoris: Secondary | ICD-10-CM | POA: Diagnosis not present

## 2023-02-07 DIAGNOSIS — G63 Polyneuropathy in diseases classified elsewhere: Secondary | ICD-10-CM | POA: Diagnosis not present

## 2023-02-07 DIAGNOSIS — E1129 Type 2 diabetes mellitus with other diabetic kidney complication: Secondary | ICD-10-CM | POA: Diagnosis not present

## 2023-03-04 DIAGNOSIS — Z951 Presence of aortocoronary bypass graft: Secondary | ICD-10-CM | POA: Diagnosis not present

## 2023-03-04 DIAGNOSIS — I2510 Atherosclerotic heart disease of native coronary artery without angina pectoris: Secondary | ICD-10-CM | POA: Diagnosis not present

## 2023-03-04 DIAGNOSIS — I252 Old myocardial infarction: Secondary | ICD-10-CM | POA: Diagnosis not present

## 2023-03-04 DIAGNOSIS — I358 Other nonrheumatic aortic valve disorders: Secondary | ICD-10-CM | POA: Diagnosis not present

## 2023-03-04 DIAGNOSIS — I517 Cardiomegaly: Secondary | ICD-10-CM | POA: Diagnosis not present

## 2023-05-19 DIAGNOSIS — Z951 Presence of aortocoronary bypass graft: Secondary | ICD-10-CM | POA: Diagnosis not present

## 2023-05-19 DIAGNOSIS — E785 Hyperlipidemia, unspecified: Secondary | ICD-10-CM | POA: Diagnosis not present

## 2023-05-19 DIAGNOSIS — I252 Old myocardial infarction: Secondary | ICD-10-CM | POA: Diagnosis not present

## 2023-05-19 DIAGNOSIS — I10 Essential (primary) hypertension: Secondary | ICD-10-CM | POA: Diagnosis not present

## 2023-05-19 DIAGNOSIS — I2510 Atherosclerotic heart disease of native coronary artery without angina pectoris: Secondary | ICD-10-CM | POA: Diagnosis not present

## 2023-07-14 DIAGNOSIS — E1129 Type 2 diabetes mellitus with other diabetic kidney complication: Secondary | ICD-10-CM | POA: Diagnosis not present

## 2023-11-25 DIAGNOSIS — H5202 Hypermetropia, left eye: Secondary | ICD-10-CM | POA: Diagnosis not present

## 2023-11-25 DIAGNOSIS — H52223 Regular astigmatism, bilateral: Secondary | ICD-10-CM | POA: Diagnosis not present

## 2023-11-25 DIAGNOSIS — H2513 Age-related nuclear cataract, bilateral: Secondary | ICD-10-CM | POA: Diagnosis not present

## 2023-11-25 DIAGNOSIS — Z794 Long term (current) use of insulin: Secondary | ICD-10-CM | POA: Diagnosis not present

## 2023-11-25 DIAGNOSIS — E119 Type 2 diabetes mellitus without complications: Secondary | ICD-10-CM | POA: Diagnosis not present

## 2023-12-03 DIAGNOSIS — M5387 Other specified dorsopathies, lumbosacral region: Secondary | ICD-10-CM | POA: Diagnosis not present

## 2023-12-03 DIAGNOSIS — M461 Sacroiliitis, not elsewhere classified: Secondary | ICD-10-CM | POA: Diagnosis not present

## 2023-12-03 DIAGNOSIS — M9903 Segmental and somatic dysfunction of lumbar region: Secondary | ICD-10-CM | POA: Diagnosis not present

## 2023-12-03 DIAGNOSIS — M9905 Segmental and somatic dysfunction of pelvic region: Secondary | ICD-10-CM | POA: Diagnosis not present

## 2023-12-03 DIAGNOSIS — M9902 Segmental and somatic dysfunction of thoracic region: Secondary | ICD-10-CM | POA: Diagnosis not present

## 2023-12-03 DIAGNOSIS — M7918 Myalgia, other site: Secondary | ICD-10-CM | POA: Diagnosis not present

## 2023-12-05 DIAGNOSIS — M7918 Myalgia, other site: Secondary | ICD-10-CM | POA: Diagnosis not present

## 2023-12-05 DIAGNOSIS — M5387 Other specified dorsopathies, lumbosacral region: Secondary | ICD-10-CM | POA: Diagnosis not present

## 2023-12-05 DIAGNOSIS — M461 Sacroiliitis, not elsewhere classified: Secondary | ICD-10-CM | POA: Diagnosis not present

## 2023-12-05 DIAGNOSIS — M9905 Segmental and somatic dysfunction of pelvic region: Secondary | ICD-10-CM | POA: Diagnosis not present

## 2023-12-05 DIAGNOSIS — M9902 Segmental and somatic dysfunction of thoracic region: Secondary | ICD-10-CM | POA: Diagnosis not present

## 2023-12-05 DIAGNOSIS — M9903 Segmental and somatic dysfunction of lumbar region: Secondary | ICD-10-CM | POA: Diagnosis not present

## 2023-12-12 DIAGNOSIS — M7918 Myalgia, other site: Secondary | ICD-10-CM | POA: Diagnosis not present

## 2023-12-12 DIAGNOSIS — M9903 Segmental and somatic dysfunction of lumbar region: Secondary | ICD-10-CM | POA: Diagnosis not present

## 2023-12-12 DIAGNOSIS — M5387 Other specified dorsopathies, lumbosacral region: Secondary | ICD-10-CM | POA: Diagnosis not present

## 2023-12-12 DIAGNOSIS — M9905 Segmental and somatic dysfunction of pelvic region: Secondary | ICD-10-CM | POA: Diagnosis not present

## 2023-12-12 DIAGNOSIS — M461 Sacroiliitis, not elsewhere classified: Secondary | ICD-10-CM | POA: Diagnosis not present

## 2023-12-12 DIAGNOSIS — M9902 Segmental and somatic dysfunction of thoracic region: Secondary | ICD-10-CM | POA: Diagnosis not present

## 2023-12-15 DIAGNOSIS — M7918 Myalgia, other site: Secondary | ICD-10-CM | POA: Diagnosis not present

## 2023-12-15 DIAGNOSIS — M9902 Segmental and somatic dysfunction of thoracic region: Secondary | ICD-10-CM | POA: Diagnosis not present

## 2023-12-15 DIAGNOSIS — M5387 Other specified dorsopathies, lumbosacral region: Secondary | ICD-10-CM | POA: Diagnosis not present

## 2023-12-15 DIAGNOSIS — M461 Sacroiliitis, not elsewhere classified: Secondary | ICD-10-CM | POA: Diagnosis not present

## 2023-12-15 DIAGNOSIS — M9905 Segmental and somatic dysfunction of pelvic region: Secondary | ICD-10-CM | POA: Diagnosis not present

## 2023-12-15 DIAGNOSIS — M9903 Segmental and somatic dysfunction of lumbar region: Secondary | ICD-10-CM | POA: Diagnosis not present

## 2023-12-17 DIAGNOSIS — Z951 Presence of aortocoronary bypass graft: Secondary | ICD-10-CM | POA: Diagnosis not present

## 2023-12-17 DIAGNOSIS — M461 Sacroiliitis, not elsewhere classified: Secondary | ICD-10-CM | POA: Diagnosis not present

## 2023-12-17 DIAGNOSIS — E785 Hyperlipidemia, unspecified: Secondary | ICD-10-CM | POA: Diagnosis not present

## 2023-12-17 DIAGNOSIS — M7918 Myalgia, other site: Secondary | ICD-10-CM | POA: Diagnosis not present

## 2023-12-17 DIAGNOSIS — M9903 Segmental and somatic dysfunction of lumbar region: Secondary | ICD-10-CM | POA: Diagnosis not present

## 2023-12-17 DIAGNOSIS — I252 Old myocardial infarction: Secondary | ICD-10-CM | POA: Diagnosis not present

## 2023-12-17 DIAGNOSIS — I10 Essential (primary) hypertension: Secondary | ICD-10-CM | POA: Diagnosis not present

## 2023-12-17 DIAGNOSIS — I2510 Atherosclerotic heart disease of native coronary artery without angina pectoris: Secondary | ICD-10-CM | POA: Diagnosis not present

## 2023-12-17 DIAGNOSIS — M5387 Other specified dorsopathies, lumbosacral region: Secondary | ICD-10-CM | POA: Diagnosis not present

## 2023-12-17 DIAGNOSIS — M9905 Segmental and somatic dysfunction of pelvic region: Secondary | ICD-10-CM | POA: Diagnosis not present

## 2023-12-17 DIAGNOSIS — M9902 Segmental and somatic dysfunction of thoracic region: Secondary | ICD-10-CM | POA: Diagnosis not present

## 2023-12-23 DIAGNOSIS — M7918 Myalgia, other site: Secondary | ICD-10-CM | POA: Diagnosis not present

## 2023-12-23 DIAGNOSIS — M461 Sacroiliitis, not elsewhere classified: Secondary | ICD-10-CM | POA: Diagnosis not present

## 2023-12-23 DIAGNOSIS — M9903 Segmental and somatic dysfunction of lumbar region: Secondary | ICD-10-CM | POA: Diagnosis not present

## 2023-12-23 DIAGNOSIS — M9905 Segmental and somatic dysfunction of pelvic region: Secondary | ICD-10-CM | POA: Diagnosis not present

## 2023-12-23 DIAGNOSIS — M5387 Other specified dorsopathies, lumbosacral region: Secondary | ICD-10-CM | POA: Diagnosis not present

## 2023-12-23 DIAGNOSIS — M9902 Segmental and somatic dysfunction of thoracic region: Secondary | ICD-10-CM | POA: Diagnosis not present

## 2023-12-26 DIAGNOSIS — M461 Sacroiliitis, not elsewhere classified: Secondary | ICD-10-CM | POA: Diagnosis not present

## 2023-12-26 DIAGNOSIS — M9902 Segmental and somatic dysfunction of thoracic region: Secondary | ICD-10-CM | POA: Diagnosis not present

## 2023-12-26 DIAGNOSIS — M9903 Segmental and somatic dysfunction of lumbar region: Secondary | ICD-10-CM | POA: Diagnosis not present

## 2023-12-26 DIAGNOSIS — M9905 Segmental and somatic dysfunction of pelvic region: Secondary | ICD-10-CM | POA: Diagnosis not present

## 2023-12-26 DIAGNOSIS — M7918 Myalgia, other site: Secondary | ICD-10-CM | POA: Diagnosis not present

## 2023-12-26 DIAGNOSIS — M5387 Other specified dorsopathies, lumbosacral region: Secondary | ICD-10-CM | POA: Diagnosis not present

## 2023-12-29 DIAGNOSIS — M9905 Segmental and somatic dysfunction of pelvic region: Secondary | ICD-10-CM | POA: Diagnosis not present

## 2023-12-29 DIAGNOSIS — L603 Nail dystrophy: Secondary | ICD-10-CM | POA: Diagnosis not present

## 2023-12-29 DIAGNOSIS — M9902 Segmental and somatic dysfunction of thoracic region: Secondary | ICD-10-CM | POA: Diagnosis not present

## 2023-12-29 DIAGNOSIS — M5387 Other specified dorsopathies, lumbosacral region: Secondary | ICD-10-CM | POA: Diagnosis not present

## 2023-12-29 DIAGNOSIS — M7918 Myalgia, other site: Secondary | ICD-10-CM | POA: Diagnosis not present

## 2023-12-29 DIAGNOSIS — L602 Onychogryphosis: Secondary | ICD-10-CM | POA: Diagnosis not present

## 2023-12-29 DIAGNOSIS — M461 Sacroiliitis, not elsewhere classified: Secondary | ICD-10-CM | POA: Diagnosis not present

## 2023-12-29 DIAGNOSIS — Z89422 Acquired absence of other left toe(s): Secondary | ICD-10-CM | POA: Diagnosis not present

## 2023-12-29 DIAGNOSIS — E1142 Type 2 diabetes mellitus with diabetic polyneuropathy: Secondary | ICD-10-CM | POA: Diagnosis not present

## 2023-12-29 DIAGNOSIS — Z794 Long term (current) use of insulin: Secondary | ICD-10-CM | POA: Diagnosis not present

## 2023-12-29 DIAGNOSIS — M9903 Segmental and somatic dysfunction of lumbar region: Secondary | ICD-10-CM | POA: Diagnosis not present

## 2024-01-02 DIAGNOSIS — M9905 Segmental and somatic dysfunction of pelvic region: Secondary | ICD-10-CM | POA: Diagnosis not present

## 2024-01-02 DIAGNOSIS — M9903 Segmental and somatic dysfunction of lumbar region: Secondary | ICD-10-CM | POA: Diagnosis not present

## 2024-01-02 DIAGNOSIS — M7918 Myalgia, other site: Secondary | ICD-10-CM | POA: Diagnosis not present

## 2024-01-02 DIAGNOSIS — M5387 Other specified dorsopathies, lumbosacral region: Secondary | ICD-10-CM | POA: Diagnosis not present

## 2024-01-02 DIAGNOSIS — M9902 Segmental and somatic dysfunction of thoracic region: Secondary | ICD-10-CM | POA: Diagnosis not present

## 2024-01-02 DIAGNOSIS — M461 Sacroiliitis, not elsewhere classified: Secondary | ICD-10-CM | POA: Diagnosis not present

## 2024-01-06 DIAGNOSIS — M5387 Other specified dorsopathies, lumbosacral region: Secondary | ICD-10-CM | POA: Diagnosis not present

## 2024-01-06 DIAGNOSIS — M9902 Segmental and somatic dysfunction of thoracic region: Secondary | ICD-10-CM | POA: Diagnosis not present

## 2024-01-06 DIAGNOSIS — M461 Sacroiliitis, not elsewhere classified: Secondary | ICD-10-CM | POA: Diagnosis not present

## 2024-01-06 DIAGNOSIS — M9905 Segmental and somatic dysfunction of pelvic region: Secondary | ICD-10-CM | POA: Diagnosis not present

## 2024-01-06 DIAGNOSIS — M9903 Segmental and somatic dysfunction of lumbar region: Secondary | ICD-10-CM | POA: Diagnosis not present

## 2024-01-06 DIAGNOSIS — M7918 Myalgia, other site: Secondary | ICD-10-CM | POA: Diagnosis not present

## 2024-01-09 DIAGNOSIS — M461 Sacroiliitis, not elsewhere classified: Secondary | ICD-10-CM | POA: Diagnosis not present

## 2024-01-09 DIAGNOSIS — M9902 Segmental and somatic dysfunction of thoracic region: Secondary | ICD-10-CM | POA: Diagnosis not present

## 2024-01-09 DIAGNOSIS — M5387 Other specified dorsopathies, lumbosacral region: Secondary | ICD-10-CM | POA: Diagnosis not present

## 2024-01-09 DIAGNOSIS — M7918 Myalgia, other site: Secondary | ICD-10-CM | POA: Diagnosis not present

## 2024-01-09 DIAGNOSIS — M9903 Segmental and somatic dysfunction of lumbar region: Secondary | ICD-10-CM | POA: Diagnosis not present

## 2024-01-09 DIAGNOSIS — M9905 Segmental and somatic dysfunction of pelvic region: Secondary | ICD-10-CM | POA: Diagnosis not present

## 2024-01-12 DIAGNOSIS — E1129 Type 2 diabetes mellitus with other diabetic kidney complication: Secondary | ICD-10-CM | POA: Diagnosis not present

## 2024-01-14 DIAGNOSIS — M9903 Segmental and somatic dysfunction of lumbar region: Secondary | ICD-10-CM | POA: Diagnosis not present

## 2024-01-14 DIAGNOSIS — M461 Sacroiliitis, not elsewhere classified: Secondary | ICD-10-CM | POA: Diagnosis not present

## 2024-01-14 DIAGNOSIS — M9905 Segmental and somatic dysfunction of pelvic region: Secondary | ICD-10-CM | POA: Diagnosis not present

## 2024-01-14 DIAGNOSIS — M7918 Myalgia, other site: Secondary | ICD-10-CM | POA: Diagnosis not present

## 2024-01-14 DIAGNOSIS — M9902 Segmental and somatic dysfunction of thoracic region: Secondary | ICD-10-CM | POA: Diagnosis not present

## 2024-01-14 DIAGNOSIS — M5387 Other specified dorsopathies, lumbosacral region: Secondary | ICD-10-CM | POA: Diagnosis not present

## 2024-01-21 DIAGNOSIS — M9902 Segmental and somatic dysfunction of thoracic region: Secondary | ICD-10-CM | POA: Diagnosis not present

## 2024-01-21 DIAGNOSIS — M9903 Segmental and somatic dysfunction of lumbar region: Secondary | ICD-10-CM | POA: Diagnosis not present

## 2024-01-21 DIAGNOSIS — M5387 Other specified dorsopathies, lumbosacral region: Secondary | ICD-10-CM | POA: Diagnosis not present

## 2024-01-21 DIAGNOSIS — M9905 Segmental and somatic dysfunction of pelvic region: Secondary | ICD-10-CM | POA: Diagnosis not present

## 2024-01-21 DIAGNOSIS — M461 Sacroiliitis, not elsewhere classified: Secondary | ICD-10-CM | POA: Diagnosis not present

## 2024-01-21 DIAGNOSIS — M7918 Myalgia, other site: Secondary | ICD-10-CM | POA: Diagnosis not present

## 2024-01-28 DIAGNOSIS — M9905 Segmental and somatic dysfunction of pelvic region: Secondary | ICD-10-CM | POA: Diagnosis not present

## 2024-01-28 DIAGNOSIS — M9902 Segmental and somatic dysfunction of thoracic region: Secondary | ICD-10-CM | POA: Diagnosis not present

## 2024-01-28 DIAGNOSIS — M7918 Myalgia, other site: Secondary | ICD-10-CM | POA: Diagnosis not present

## 2024-01-28 DIAGNOSIS — M9903 Segmental and somatic dysfunction of lumbar region: Secondary | ICD-10-CM | POA: Diagnosis not present

## 2024-01-28 DIAGNOSIS — M461 Sacroiliitis, not elsewhere classified: Secondary | ICD-10-CM | POA: Diagnosis not present

## 2024-01-28 DIAGNOSIS — M5387 Other specified dorsopathies, lumbosacral region: Secondary | ICD-10-CM | POA: Diagnosis not present

## 2024-02-06 DIAGNOSIS — M5459 Other low back pain: Secondary | ICD-10-CM | POA: Diagnosis not present

## 2024-02-11 DIAGNOSIS — M7918 Myalgia, other site: Secondary | ICD-10-CM | POA: Diagnosis not present

## 2024-02-11 DIAGNOSIS — M9905 Segmental and somatic dysfunction of pelvic region: Secondary | ICD-10-CM | POA: Diagnosis not present

## 2024-02-11 DIAGNOSIS — M5387 Other specified dorsopathies, lumbosacral region: Secondary | ICD-10-CM | POA: Diagnosis not present

## 2024-02-11 DIAGNOSIS — M9903 Segmental and somatic dysfunction of lumbar region: Secondary | ICD-10-CM | POA: Diagnosis not present

## 2024-02-11 DIAGNOSIS — M9902 Segmental and somatic dysfunction of thoracic region: Secondary | ICD-10-CM | POA: Diagnosis not present

## 2024-02-11 DIAGNOSIS — M461 Sacroiliitis, not elsewhere classified: Secondary | ICD-10-CM | POA: Diagnosis not present

## 2024-02-25 DIAGNOSIS — M7918 Myalgia, other site: Secondary | ICD-10-CM | POA: Diagnosis not present

## 2024-02-25 DIAGNOSIS — M461 Sacroiliitis, not elsewhere classified: Secondary | ICD-10-CM | POA: Diagnosis not present

## 2024-02-25 DIAGNOSIS — M9905 Segmental and somatic dysfunction of pelvic region: Secondary | ICD-10-CM | POA: Diagnosis not present

## 2024-02-25 DIAGNOSIS — M9902 Segmental and somatic dysfunction of thoracic region: Secondary | ICD-10-CM | POA: Diagnosis not present

## 2024-02-25 DIAGNOSIS — M5387 Other specified dorsopathies, lumbosacral region: Secondary | ICD-10-CM | POA: Diagnosis not present

## 2024-02-25 DIAGNOSIS — M9903 Segmental and somatic dysfunction of lumbar region: Secondary | ICD-10-CM | POA: Diagnosis not present

## 2024-03-03 DIAGNOSIS — N400 Enlarged prostate without lower urinary tract symptoms: Secondary | ICD-10-CM | POA: Diagnosis not present

## 2024-03-03 DIAGNOSIS — N200 Calculus of kidney: Secondary | ICD-10-CM | POA: Diagnosis not present

## 2024-03-08 DIAGNOSIS — M545 Low back pain, unspecified: Secondary | ICD-10-CM | POA: Diagnosis not present

## 2024-03-08 DIAGNOSIS — M6281 Muscle weakness (generalized): Secondary | ICD-10-CM | POA: Diagnosis not present

## 2024-03-10 DIAGNOSIS — M9905 Segmental and somatic dysfunction of pelvic region: Secondary | ICD-10-CM | POA: Diagnosis not present

## 2024-03-10 DIAGNOSIS — M5387 Other specified dorsopathies, lumbosacral region: Secondary | ICD-10-CM | POA: Diagnosis not present

## 2024-03-10 DIAGNOSIS — M9902 Segmental and somatic dysfunction of thoracic region: Secondary | ICD-10-CM | POA: Diagnosis not present

## 2024-03-10 DIAGNOSIS — M9903 Segmental and somatic dysfunction of lumbar region: Secondary | ICD-10-CM | POA: Diagnosis not present

## 2024-03-10 DIAGNOSIS — M461 Sacroiliitis, not elsewhere classified: Secondary | ICD-10-CM | POA: Diagnosis not present

## 2024-03-10 DIAGNOSIS — M7918 Myalgia, other site: Secondary | ICD-10-CM | POA: Diagnosis not present

## 2024-03-11 DIAGNOSIS — M6281 Muscle weakness (generalized): Secondary | ICD-10-CM | POA: Diagnosis not present

## 2024-03-11 DIAGNOSIS — M545 Low back pain, unspecified: Secondary | ICD-10-CM | POA: Diagnosis not present

## 2024-03-15 DIAGNOSIS — L255 Unspecified contact dermatitis due to plants, except food: Secondary | ICD-10-CM | POA: Diagnosis not present

## 2024-03-15 DIAGNOSIS — L299 Pruritus, unspecified: Secondary | ICD-10-CM | POA: Diagnosis not present

## 2024-03-15 DIAGNOSIS — R21 Rash and other nonspecific skin eruption: Secondary | ICD-10-CM | POA: Diagnosis not present

## 2024-03-18 DIAGNOSIS — M545 Low back pain, unspecified: Secondary | ICD-10-CM | POA: Diagnosis not present

## 2024-03-18 DIAGNOSIS — M6281 Muscle weakness (generalized): Secondary | ICD-10-CM | POA: Diagnosis not present

## 2024-03-24 DIAGNOSIS — M461 Sacroiliitis, not elsewhere classified: Secondary | ICD-10-CM | POA: Diagnosis not present

## 2024-03-24 DIAGNOSIS — M9902 Segmental and somatic dysfunction of thoracic region: Secondary | ICD-10-CM | POA: Diagnosis not present

## 2024-03-24 DIAGNOSIS — M9903 Segmental and somatic dysfunction of lumbar region: Secondary | ICD-10-CM | POA: Diagnosis not present

## 2024-03-24 DIAGNOSIS — M7918 Myalgia, other site: Secondary | ICD-10-CM | POA: Diagnosis not present

## 2024-03-24 DIAGNOSIS — M9905 Segmental and somatic dysfunction of pelvic region: Secondary | ICD-10-CM | POA: Diagnosis not present

## 2024-03-24 DIAGNOSIS — M5387 Other specified dorsopathies, lumbosacral region: Secondary | ICD-10-CM | POA: Diagnosis not present

## 2024-03-25 ENCOUNTER — Telehealth: Payer: Self-pay

## 2024-03-25 ENCOUNTER — Other Ambulatory Visit (HOSPITAL_COMMUNITY): Payer: Self-pay

## 2024-03-25 DIAGNOSIS — M545 Low back pain, unspecified: Secondary | ICD-10-CM | POA: Diagnosis not present

## 2024-03-25 DIAGNOSIS — M6281 Muscle weakness (generalized): Secondary | ICD-10-CM | POA: Diagnosis not present

## 2024-03-25 NOTE — Telephone Encounter (Signed)
 Completed patient assistance applications for Farxiga  (AZ&Me) and Tresiba (Novo Nordisk) and Warden/ranger to [employer] to have patient and provider review and sign at upcoming appointment

## 2024-03-29 NOTE — Telephone Encounter (Signed)
 PAP: Application for [place] has been submitted to [employer], via fax  PAP: Application for Farxiga  has been submitted to [employer] (AZ&Me), via fax

## 2024-03-31 NOTE — Telephone Encounter (Signed)
 PAP: Patient assistance application for Farxiga  has been approved by PAP Companies: AZ&ME from 03/31/2024 to 08/11/2024. Medication should be delivered to PAP Delivery: Home. For further shipping updates, please contact [employer] (AZ&Me) at [phone]. Patient ID is: [id number]

## 2024-04-01 NOTE — Telephone Encounter (Signed)
 PAP: Patient assistance application for [place] has been approved by PAP Companies: [employer] from 03/31/2024 to 08/11/2024. Medication should be delivered to PAP Delivery: Provider's office. For further shipping updates, please contact [employer] at [phone]. Patient ID is: [id number]

## 2024-04-01 NOTE — Progress Notes (Signed)
 Pharmacy Medication Assistance Program Note    04/01/2024  Patient ID: Larry May, male   DOB: [birth year], 78 y.o.   MRN: [id number]     03/25/2024  Outreach Medication One  Initial Outreach Date (Medication One) 03/25/2024  Manufacturer Medication One [employer]  [employer] [facility] Farxiga   Dose of Farxiga  10mg   Type of Assistance Manufacturer Assistance  Date Application Sent to Patient 03/25/2024  Application Items Requested Application  Date Application Sent to Prescriber 03/25/2024  Name of Prescriber [address]  Date Application Received From Patient 03/29/2024  Date Application Received From Provider 03/29/2024  Date Application Submitted to Manufacturer 03/29/2024  Method Application Sent to Manufacturer Fax  Patient Assistance Determination Approved  Approval Start Date 03/31/2024  Approval End Date 08/11/2024  Patient Notification Method Telephone Call        03/25/2024  Outreach Medication Two  Initial Outreach Date (Medication Two) 03/25/2024  Manufacturer Medication Two Novo Nordisk  [facility] [place]  Dose of Tresiba u200  Type of Radiographer, therapeutic Assistance  Date Application Sent to Patient 03/25/2024  Application Items Requested Application  Date Application Sent to Prescriber 03/25/2024  Name of Prescriber [address]  Date Application Received From Patient 03/29/2024  Application Items Received From Patient Application  Date Application Received From Provider 03/29/2024  Method Application Sent to Manufacturer Fax  Date Application Submitted to Manufacturer 03/29/2024  Patient Assistance Determination Approved  Approval Start Date 03/31/2024  Patient Notification Method Telephone Call     Signature

## 2024-04-02 DIAGNOSIS — Z89422 Acquired absence of other left toe(s): Secondary | ICD-10-CM | POA: Diagnosis not present

## 2024-04-02 DIAGNOSIS — E1142 Type 2 diabetes mellitus with diabetic polyneuropathy: Secondary | ICD-10-CM | POA: Diagnosis not present

## 2024-04-02 DIAGNOSIS — L602 Onychogryphosis: Secondary | ICD-10-CM | POA: Diagnosis not present

## 2024-04-02 DIAGNOSIS — L603 Nail dystrophy: Secondary | ICD-10-CM | POA: Diagnosis not present

## 2024-04-07 DIAGNOSIS — M461 Sacroiliitis, not elsewhere classified: Secondary | ICD-10-CM | POA: Diagnosis not present

## 2024-04-07 DIAGNOSIS — M9902 Segmental and somatic dysfunction of thoracic region: Secondary | ICD-10-CM | POA: Diagnosis not present

## 2024-04-07 DIAGNOSIS — M9905 Segmental and somatic dysfunction of pelvic region: Secondary | ICD-10-CM | POA: Diagnosis not present

## 2024-04-07 DIAGNOSIS — M7918 Myalgia, other site: Secondary | ICD-10-CM | POA: Diagnosis not present

## 2024-04-07 DIAGNOSIS — M9903 Segmental and somatic dysfunction of lumbar region: Secondary | ICD-10-CM | POA: Diagnosis not present

## 2024-04-07 DIAGNOSIS — M5387 Other specified dorsopathies, lumbosacral region: Secondary | ICD-10-CM | POA: Diagnosis not present

## 2024-04-08 DIAGNOSIS — M6281 Muscle weakness (generalized): Secondary | ICD-10-CM | POA: Diagnosis not present

## 2024-04-08 DIAGNOSIS — M545 Low back pain, unspecified: Secondary | ICD-10-CM | POA: Diagnosis not present

## 2024-04-28 DIAGNOSIS — M461 Sacroiliitis, not elsewhere classified: Secondary | ICD-10-CM | POA: Diagnosis not present

## 2024-04-28 DIAGNOSIS — M9903 Segmental and somatic dysfunction of lumbar region: Secondary | ICD-10-CM | POA: Diagnosis not present

## 2024-04-28 DIAGNOSIS — M7918 Myalgia, other site: Secondary | ICD-10-CM | POA: Diagnosis not present

## 2024-04-28 DIAGNOSIS — M5387 Other specified dorsopathies, lumbosacral region: Secondary | ICD-10-CM | POA: Diagnosis not present

## 2024-04-28 DIAGNOSIS — M9902 Segmental and somatic dysfunction of thoracic region: Secondary | ICD-10-CM | POA: Diagnosis not present

## 2024-04-28 DIAGNOSIS — M9905 Segmental and somatic dysfunction of pelvic region: Secondary | ICD-10-CM | POA: Diagnosis not present

## 2024-05-26 DIAGNOSIS — M9902 Segmental and somatic dysfunction of thoracic region: Secondary | ICD-10-CM | POA: Diagnosis not present

## 2024-05-26 DIAGNOSIS — M5387 Other specified dorsopathies, lumbosacral region: Secondary | ICD-10-CM | POA: Diagnosis not present

## 2024-05-26 DIAGNOSIS — M9903 Segmental and somatic dysfunction of lumbar region: Secondary | ICD-10-CM | POA: Diagnosis not present

## 2024-05-26 DIAGNOSIS — M7918 Myalgia, other site: Secondary | ICD-10-CM | POA: Diagnosis not present

## 2024-05-26 DIAGNOSIS — M461 Sacroiliitis, not elsewhere classified: Secondary | ICD-10-CM | POA: Diagnosis not present

## 2024-05-26 DIAGNOSIS — M9905 Segmental and somatic dysfunction of pelvic region: Secondary | ICD-10-CM | POA: Diagnosis not present

## 2024-05-28 ENCOUNTER — Telehealth: Payer: Self-pay

## 2024-05-28 NOTE — Telephone Encounter (Signed)
 Opened in error

## 2024-05-28 NOTE — Telephone Encounter (Signed)
 2026 Renewal  PAP: Patient assistance application for Farxiga  through [employer] (AZ&Me) has been mailed to pt's home address on file. Provider portion of application will be faxed to provider's office.   PAP: Patient assistance application for Tresiba through [employer] has been mailed to [employer] home address on file. Provider portion of application will be faxed to provider's office.  Provider portion of application has been sent to Dr. Medford Rubens at [facility]  Left voice mail for pt. That I would be at [place] on Monday 10/20.  If pt. Does not come by to sign I will mail 10/21

## 2024-05-31 NOTE — Telephone Encounter (Signed)
 PAP: Application for Farxiga  has been submitted to [employer] (AZ&Me), via fax  PAP: Application for [place] has been submitted to [employer], via fax

## 2024-05-31 NOTE — Telephone Encounter (Signed)
 Received patient portion of patient assistance application

## 2024-06-03 NOTE — Telephone Encounter (Addendum)
 PAP: Patient assistance application for Farxiga  has been approved by PAP Companies: AZ&ME from 08/12/2024 to 08/11/2025. Medication should be delivered to PAP Delivery: Home. For further shipping updates, please contact [employer] (AZ&Me) at [phone]. Patient ID is: [id number]

## 2024-06-03 NOTE — Telephone Encounter (Signed)
 Novo sent fax stating they will process application 11/15

## 2024-06-16 DIAGNOSIS — M546 Pain in thoracic spine: Secondary | ICD-10-CM | POA: Diagnosis not present

## 2024-06-28 DIAGNOSIS — Z6838 Body mass index (BMI) 38.0-38.9, adult: Secondary | ICD-10-CM | POA: Diagnosis not present

## 2024-06-28 DIAGNOSIS — Z1331 Encounter for screening for depression: Secondary | ICD-10-CM | POA: Diagnosis not present

## 2024-06-28 DIAGNOSIS — R809 Proteinuria, unspecified: Secondary | ICD-10-CM | POA: Diagnosis not present

## 2024-06-28 DIAGNOSIS — R0789 Other chest pain: Secondary | ICD-10-CM | POA: Diagnosis not present

## 2024-06-28 DIAGNOSIS — E1129 Type 2 diabetes mellitus with other diabetic kidney complication: Secondary | ICD-10-CM | POA: Diagnosis not present

## 2024-07-01 DIAGNOSIS — R0789 Other chest pain: Secondary | ICD-10-CM | POA: Diagnosis not present

## 2024-07-02 DIAGNOSIS — J439 Emphysema, unspecified: Secondary | ICD-10-CM | POA: Diagnosis not present

## 2024-07-05 DIAGNOSIS — Z89422 Acquired absence of other left toe(s): Secondary | ICD-10-CM | POA: Diagnosis not present

## 2024-07-05 DIAGNOSIS — L602 Onychogryphosis: Secondary | ICD-10-CM | POA: Diagnosis not present

## 2024-07-05 DIAGNOSIS — E1142 Type 2 diabetes mellitus with diabetic polyneuropathy: Secondary | ICD-10-CM | POA: Diagnosis not present

## 2024-07-05 DIAGNOSIS — L603 Nail dystrophy: Secondary | ICD-10-CM | POA: Diagnosis not present

## 2024-07-20 ENCOUNTER — Other Ambulatory Visit (HOSPITAL_COMMUNITY): Payer: Self-pay

## 2024-07-21 NOTE — Telephone Encounter (Signed)
 Spoke with [employer] and patient will need to submit LIS denial letter,  I left patient a voice mail to return my call to discuss

## 2024-07-26 ENCOUNTER — Other Ambulatory Visit: Payer: Self-pay | Admitting: Pharmacist

## 2024-07-26 NOTE — Progress Notes (Signed)
° °  07/26/2024  Patient ID: Larry May, male   DOB: 1946-03-25, 78 y.o.   MRN: [id number]  [name] stopped by office today, patient has been denied from [employer] patient assistance for 2026 for his [place] due to needing proof of denial from [employer] Extra Help Program.  Based on patient's reported income, completed Medicare Extra Help Program application with patient in the office. Patient made aware that a determination will be made by the Social Security Administration within 3 to 4 weeks. Patient will be notified by mail at home address used for application today.    Requested that [name] call and let me know of the outcome of the determination. Advised that proof of denial will be required for enrollment into [employer] for 2026.    Annabella Galla, PharmD Clinical Pharmacist [place] Direct Dial: [phone]

## 2024-08-16 ENCOUNTER — Other Ambulatory Visit (HOSPITAL_COMMUNITY): Payer: Self-pay

## 2024-08-19 ENCOUNTER — Other Ambulatory Visit (HOSPITAL_COMMUNITY): Payer: Self-pay

## 2024-09-09 ENCOUNTER — Telehealth: Payer: Self-pay

## 2024-09-09 NOTE — Telephone Encounter (Signed)
 Submitted LIS denial letter to [employer]
